# Patient Record
Sex: Female | Born: 1960 | Race: Black or African American | Hispanic: No | Smoking: Current every day smoker
Health system: Southern US, Community
[De-identification: ages and names within clinical notes are randomized; demographics above are authoritative.]

## PROBLEM LIST (undated history)

## (undated) DIAGNOSIS — F319 Bipolar disorder, unspecified: Secondary | ICD-10-CM

## (undated) DIAGNOSIS — F99 Mental disorder, not otherwise specified: Secondary | ICD-10-CM

## (undated) DIAGNOSIS — F329 Major depressive disorder, single episode, unspecified: Secondary | ICD-10-CM

## (undated) DIAGNOSIS — I10 Essential (primary) hypertension: Secondary | ICD-10-CM

## (undated) DIAGNOSIS — F32A Depression, unspecified: Secondary | ICD-10-CM

## (undated) DIAGNOSIS — C8590 Non-Hodgkin lymphoma, unspecified, unspecified site: Secondary | ICD-10-CM

## (undated) DIAGNOSIS — J439 Emphysema, unspecified: Secondary | ICD-10-CM

## (undated) DIAGNOSIS — F209 Schizophrenia, unspecified: Secondary | ICD-10-CM

## (undated) HISTORY — PX: NECK SURGERY: SHX720

## (undated) HISTORY — PX: TUBAL LIGATION: SHX77

## (undated) HISTORY — DX: Emphysema, unspecified: J43.9

## (undated) HISTORY — DX: Non-Hodgkin lymphoma, unspecified, unspecified site: C85.90

---

## 1979-05-14 DIAGNOSIS — D649 Anemia, unspecified: Secondary | ICD-10-CM

## 1979-05-14 HISTORY — DX: Anemia, unspecified: D64.9

## 2000-01-02 ENCOUNTER — Emergency Department (HOSPITAL_COMMUNITY): Admission: EM | Admit: 2000-01-02 | Discharge: 2000-01-02 | Payer: Self-pay | Admitting: Emergency Medicine

## 2001-04-30 ENCOUNTER — Emergency Department (HOSPITAL_COMMUNITY): Admission: EM | Admit: 2001-04-30 | Discharge: 2001-04-30 | Payer: Self-pay

## 2001-05-12 ENCOUNTER — Emergency Department (HOSPITAL_COMMUNITY): Admission: EM | Admit: 2001-05-12 | Discharge: 2001-05-12 | Payer: Self-pay | Admitting: Emergency Medicine

## 2002-10-05 ENCOUNTER — Other Ambulatory Visit: Admission: RE | Admit: 2002-10-05 | Discharge: 2002-10-05 | Payer: Self-pay | Admitting: Family Medicine

## 2004-07-28 ENCOUNTER — Emergency Department (HOSPITAL_COMMUNITY): Admission: EM | Admit: 2004-07-28 | Discharge: 2004-07-28 | Payer: Self-pay | Admitting: Emergency Medicine

## 2005-02-08 ENCOUNTER — Ambulatory Visit: Payer: Self-pay | Admitting: Family Medicine

## 2005-03-01 ENCOUNTER — Other Ambulatory Visit: Admission: RE | Admit: 2005-03-01 | Discharge: 2005-03-01 | Payer: Self-pay | Admitting: Family Medicine

## 2005-03-01 ENCOUNTER — Ambulatory Visit: Payer: Self-pay | Admitting: Family Medicine

## 2005-09-10 ENCOUNTER — Ambulatory Visit: Payer: Self-pay | Admitting: Family Medicine

## 2005-09-12 ENCOUNTER — Ambulatory Visit: Payer: Self-pay | Admitting: Family Medicine

## 2005-10-21 ENCOUNTER — Emergency Department (HOSPITAL_COMMUNITY): Admission: EM | Admit: 2005-10-21 | Discharge: 2005-10-21 | Payer: Self-pay | Admitting: Emergency Medicine

## 2006-04-25 ENCOUNTER — Emergency Department (HOSPITAL_COMMUNITY): Admission: EM | Admit: 2006-04-25 | Discharge: 2006-04-25 | Payer: Self-pay | Admitting: Emergency Medicine

## 2006-08-14 ENCOUNTER — Encounter (INDEPENDENT_AMBULATORY_CARE_PROVIDER_SITE_OTHER): Payer: Self-pay | Admitting: Family Medicine

## 2006-08-14 ENCOUNTER — Ambulatory Visit: Payer: Self-pay | Admitting: Family Medicine

## 2006-08-18 ENCOUNTER — Ambulatory Visit (HOSPITAL_COMMUNITY): Admission: RE | Admit: 2006-08-18 | Discharge: 2006-08-18 | Payer: Self-pay | Admitting: Family Medicine

## 2006-10-14 ENCOUNTER — Ambulatory Visit: Payer: Self-pay | Admitting: Internal Medicine

## 2006-11-19 ENCOUNTER — Ambulatory Visit: Payer: Self-pay | Admitting: Internal Medicine

## 2007-05-13 ENCOUNTER — Ambulatory Visit: Payer: Self-pay | Admitting: Family Medicine

## 2007-05-15 ENCOUNTER — Ambulatory Visit: Payer: Self-pay | Admitting: Family Medicine

## 2007-09-08 ENCOUNTER — Ambulatory Visit (HOSPITAL_COMMUNITY): Admission: RE | Admit: 2007-09-08 | Discharge: 2007-09-08 | Payer: Self-pay | Admitting: Family Medicine

## 2007-12-08 ENCOUNTER — Other Ambulatory Visit: Payer: Self-pay | Admitting: Emergency Medicine

## 2007-12-08 ENCOUNTER — Ambulatory Visit: Payer: Self-pay | Admitting: Psychiatry

## 2007-12-08 ENCOUNTER — Inpatient Hospital Stay (HOSPITAL_COMMUNITY): Admission: EM | Admit: 2007-12-08 | Discharge: 2007-12-12 | Payer: Self-pay | Admitting: Psychiatry

## 2008-07-04 ENCOUNTER — Emergency Department (HOSPITAL_COMMUNITY): Admission: EM | Admit: 2008-07-04 | Discharge: 2008-07-04 | Payer: Self-pay | Admitting: Emergency Medicine

## 2008-07-14 ENCOUNTER — Emergency Department (HOSPITAL_COMMUNITY): Admission: EM | Admit: 2008-07-14 | Discharge: 2008-07-14 | Payer: Self-pay | Admitting: Emergency Medicine

## 2008-10-10 ENCOUNTER — Emergency Department (HOSPITAL_COMMUNITY): Admission: EM | Admit: 2008-10-10 | Discharge: 2008-10-10 | Payer: Self-pay | Admitting: Family Medicine

## 2009-02-16 ENCOUNTER — Emergency Department (HOSPITAL_COMMUNITY): Admission: EM | Admit: 2009-02-16 | Discharge: 2009-02-16 | Payer: Self-pay | Admitting: Emergency Medicine

## 2009-02-27 ENCOUNTER — Ambulatory Visit: Payer: Self-pay | Admitting: Family Medicine

## 2009-03-10 ENCOUNTER — Emergency Department (HOSPITAL_COMMUNITY): Admission: EM | Admit: 2009-03-10 | Discharge: 2009-03-10 | Payer: Self-pay | Admitting: Family Medicine

## 2009-05-31 ENCOUNTER — Encounter (INDEPENDENT_AMBULATORY_CARE_PROVIDER_SITE_OTHER): Payer: Self-pay | Admitting: Adult Health

## 2009-05-31 ENCOUNTER — Other Ambulatory Visit: Admission: RE | Admit: 2009-05-31 | Discharge: 2009-05-31 | Payer: Self-pay | Admitting: Internal Medicine

## 2009-05-31 ENCOUNTER — Ambulatory Visit: Payer: Self-pay | Admitting: Internal Medicine

## 2009-05-31 LAB — CONVERTED CEMR LAB
AST: 18 units/L (ref 0–37)
Alkaline Phosphatase: 54 units/L (ref 39–117)
BUN: 16 mg/dL (ref 6–23)
Calcium: 9.4 mg/dL (ref 8.4–10.5)
Chlamydia, DNA Probe: NEGATIVE
Chloride: 98 meq/L (ref 96–112)
Creatinine, Ser: 0.71 mg/dL (ref 0.40–1.20)
Eosinophils Relative: 1 % (ref 0–5)
HCT: 36.9 % (ref 36.0–46.0)
Helicobacter Pylori Antibody-IgG: 0.7
Lymphocytes Relative: 33 % (ref 12–46)
Monocytes Relative: 6 % (ref 3–12)
Neutro Abs: 4.5 10*3/uL (ref 1.7–7.7)
Neutrophils Relative %: 61 % (ref 43–77)
RBC: 3.9 M/uL (ref 3.87–5.11)
Vit D, 25-Hydroxy: 11 ng/mL — ABNORMAL LOW (ref 30–89)

## 2009-06-09 ENCOUNTER — Ambulatory Visit (HOSPITAL_COMMUNITY): Admission: RE | Admit: 2009-06-09 | Discharge: 2009-06-09 | Payer: Self-pay | Admitting: Family Medicine

## 2009-06-28 ENCOUNTER — Ambulatory Visit (HOSPITAL_COMMUNITY): Admission: RE | Admit: 2009-06-28 | Discharge: 2009-06-28 | Payer: Self-pay | Admitting: Internal Medicine

## 2009-11-04 ENCOUNTER — Encounter: Payer: Self-pay | Admitting: Internal Medicine

## 2009-11-04 ENCOUNTER — Ambulatory Visit: Payer: Self-pay | Admitting: Internal Medicine

## 2009-11-04 ENCOUNTER — Inpatient Hospital Stay (HOSPITAL_COMMUNITY): Admission: EM | Admit: 2009-11-04 | Discharge: 2009-11-06 | Payer: Self-pay | Admitting: Emergency Medicine

## 2009-11-04 DIAGNOSIS — F329 Major depressive disorder, single episode, unspecified: Secondary | ICD-10-CM

## 2009-11-04 DIAGNOSIS — F3289 Other specified depressive episodes: Secondary | ICD-10-CM | POA: Insufficient documentation

## 2009-11-04 DIAGNOSIS — F1020 Alcohol dependence, uncomplicated: Secondary | ICD-10-CM | POA: Insufficient documentation

## 2009-11-04 DIAGNOSIS — R1084 Generalized abdominal pain: Secondary | ICD-10-CM

## 2009-11-04 DIAGNOSIS — I10 Essential (primary) hypertension: Secondary | ICD-10-CM

## 2010-06-11 ENCOUNTER — Ambulatory Visit (HOSPITAL_COMMUNITY): Admission: RE | Admit: 2010-06-11 | Payer: Self-pay | Source: Home / Self Care | Admitting: Internal Medicine

## 2010-06-12 NOTE — Assessment & Plan Note (Signed)
Summary: Hospital Admission  INTERNAL MEDICINE ADMISSION HISTORY AND PHYSICAL MWU:XLKG MW:[id number] pain:"my stomach is killing me."  R1Denton Meek [phone] R2Comer Locket [phone] Attending:Dr. Aundria Rud  VFI:EPPI is 50 year old female who presents with a chief complaint of abdominal pain. Patient presents stating "I am an alcoholic and I drink too much and drank too much yesterday on an empty stomache and now my stomache is killing me." patient states that she drank seven 40 ounce beers and half a fifth of liquor. She has been in AA and rehab in the past and was sober for 13 years but recently started drinking again. patient states that at 3 am this morning she woke with abdominal pain and vomited once with blood streaked vomit. has not taken anything for pain. denies blood in stool, fevers, chills, CP, SOB, dysuria, hematuria. patient states she was "stumbling last night" with drinking but denies injury from falls, hitting head or LOC.   ALLERGIES: PCN "swollen tongue and rash."  PAST MEDICAL HISTORY: ETOH abuse HTN Depression  MEDICATIONS: Lexapro 10 mg by mouth daily Antabuse 250 mg by mouth daily Trazadone 100 mg by mouth daily   SOCIAL HISTORY: 10th grade level education; single, Unemployed; lives with her son in the [address] app[address];  Smokes 7 cig/day since age 51 y/o Crack cocaine 1 q 4 months; last use on 11/03/2009. ETOH 4x 40oz beer daily.   FAMILY HISTORY Mother:HTN, CA(type?) at age 75 y/o Son: DM   ROS:per HPI VITALS: T: 98.0 P:70  BP: 131/80 R:22  O2SAT:98%  ON:RA PHYSICAL EXAM: General:  alert, well-developed, and cooperative to examination.   Head:  normocephalic and atraumatic.   Eyes:  vision grossly intact, pupils equal, pupils round, pupils reactive to light, no injection and anicteric.   Mouth:  pharynx pink and dry, no erythema, and no exudates.   Neck:  supple, full ROM, no thyromegaly, no JVD, and no carotid bruits.   Lungs:  normal respiratory  effort, no accessory muscle use, normal breath sounds, no crackles, and no wheezes. CV: RRR, no M, S3, S4, no lifts or rubs. Abdomen: Distended; BS hypoactive; shifting dullness present; tympanic with acute abdominal pain in RUL. LL and RLL quadrants; no rebound or guarding. MSK: FROM of all extremities proximately and distally bialterally; no joint erythema, effusion or increased warmth to touch bilaterally.  Neurologic:  alert & oriented X3, cranial nerves III-XII intact, strength normal in all extremities, sensation intact to light touch, and gait normal.   Skin:  turgor normal and no rashes.   Psych:  Oriented X3, memory intact for recent and remote, normally interactive, good eye contact, not anxious appearing, and not depressed appearing.  LABS: Bilirubin, Total                         0.8               0.3-1.2          mg/dL  Bilirubin, Direct                        0.4        h      0.0-0.3          mg/dL  Indirect Bilirubin                       0.4  0.3-0.9          mg/dL  Alkaline Phosphatase                     59                39-117           U/L  SGOT (AST)                               50         h      0-37             U/L  SGPT (ALT)                               27                0-35             U/L  Total  Protein                           8.4        h      6.0-8.3          g/dL  Albumin-Blood                            4.2               3.5-5.2          g/dL  Squamous Epithelial / LPF                FEW        a      RARE  WBC / HPF                                3-6               <3               WBC/hpf  RBC / HPF                                SEE NOTE.         <3               RBC/hpf    TOO NUMEROUS TO COUNT  Bacteria / HPF                           FEW        a      RARE  olor, Urine                             AMBER      a      YELLOW    BIOCHEMICALS MAY BE AFFECTED BY COLOR  Appearance                               TURBID  a      CLEAR   Specific Gravity                         >1.046     h      1.005-1.030  pH                                       5.0               5.0-8.0  Urine Glucose                            NEGATIVE          NEG              mg/dL  Bilirubin                                NEGATIVE          NEG  Ketones                                  NEGATIVE          NEG              mg/dL  Blood                                    LARGE      a      NEG  Protein                                  NEGATIVE          NEG              mg/dL  Urobilinogen                             0.2               0.0-1.0          mg/dL  Nitrite                                  NEGATIVE          NEG  Leukocytes                               TRACE      a      NEG  Pregnancy, Urine-POC                     NEGATIVE  Lipase                                   23                11-59  U/L  Sodium (NA)                              133        l      135-145          mEq/L  Potassium (K)                            4.9               3.5-5.1          mEq/L  Chloride                                 101               96-112           mEq/L  CO2                                      18         l      19-32            mEq/L  Glucose                                  96                70-99            mg/dL  BUN                                      13                6-23             mg/dL  Creatinine                               2.37       h      0.4-1.2          mg/dL  GFR, Est Non African American            22         l      >60              mL/min  GFR, Est African American                26         l      >60              mL/min    Oversized comment, see footnote  1  Calcium                                  9.4               8.4-10.5         mg/dL  WBC  9.2               4.0-10.5         K/uL  RBC                                      4.13              3.87-5.11        MIL/uL  Hemoglobin (HGB)                          14.0              12.0-15.0        g/dL  Hematocrit (HCT)                         40.2              36.0-46.0        %  MCV                                      97.4              78.0-100.0       fL  MCH -                                    34.0              26.0-34.0        pg  MCHC                                     34.9              30.0-36.0        g/dL  RDW                                      15.4              11.5-15.5        %  Platelet Count (PLT)                     237               150-400          K/uL  Neutrophils, %                           79         h      43-77            %  Lymphocytes, %                           18                12-46            %  Monocytes, %  3                 3-12             %  Eosinophils, %                           0                 0-5              %  Basophils, %                             0                 0-1              %  Neutrophils, Absolute                    7.3               1.7-7.7          K/uL  Lymphocytes, Absolute                    1.7               0.7-4.0          K/uL  Monocytes, Absolute                      0.3               0.1-1.0          K/uL  Eosinophils, Absolute                    0.0               0.0-0.7          K/uL  Basophils, Absolute                      0.0               0.0-0.1          K/uL  CT abdomen/Pelvis with CM: IMPRESSION:    1.  Moderate ascites, which is of uncertain etiology.   2.  Mild asymmetric wall thickening involving the right bladder   wall.  Differential diagnosis includes cystitis and transitional   cell carcinoma.  Correlation with urinalysis may be helpful, and   cystoscopy should be considered for further evaluation of this   finding.  ASSESSMENT AND PLAN: (1) Acute abdominal pain in a setting of ETOH abuse. Differential Dx include: CT of abdomen neg for cholecystitis, cholelithiasis or pancreatitis. a. Acute gastritis/PUD> Protonix  IV b. SBP> IR to perform paracenthesis; will send fluid for studies. c. Hepatitis> will check Hepatitis panel. c. PID>possible; will obtain GC/Chlamydia, HIV  (2)ETOH abuse. Counselling implemented; UDS and ETOH serum levels; PT/INR and PTT. CIWA protocol. Thiamine and Folate by mouth. (3)Hx of HTN. No antihypertensive medications for now. (4)Depression. Sees Dr. Viviann Spare at [facility]. Denies SI/HI or mania at present. Will restart Lexaprol and Trazadone. (5)VTE PROPH: SCD's

## 2010-06-12 NOTE — Discharge Summary (Signed)
Summary: Hospital Discharge Update    Hospital Discharge Update:  Date of Admission: 11/04/2009  Brief Summary:  1. Acute abdominal pain (SBP vs gastritis). US-guided parachenthesis with 0.7 L of fluid taken off. 2. HTN. 3.Depression. Restarted Lexapro. sees Dr. Viviann Spare at [facility] 4. PSA. UDS cocaine positive. Counselled. 5. ETOH abuse -ETOH level >100  Other follow-up issues:  UA -to reeval hematuria. Needs PSA counselling.  Problem list changes:  Added new problem of ESSENTIAL HYPERTENSION, BENIGN (ICD-401.1) - Signed Added new problem of DEPRESSION, RECURRENT (ICD-311) - Signed Added new problem of ALCOHOLISM (ICD-303.90) - Signed Added new problem of ABDOMINAL PAIN, GENERALIZED (ICD-789.07) - Signed  Medication list changes:  Added new medication of LEXAPRO 20 MG TABS (ESCITALOPRAM OXALATE) Take 1 tablet by mouth once a day - Signed Added new medication of TRAZODONE HCL 50 MG TABS (TRAZODONE HCL) Take 1 tab by mouth at bedtime - Signed Added new medication of HYDROCHLOROTHIAZIDE 25 MG TABS (HYDROCHLOROTHIAZIDE) Take 1 tablet by mouth every morning - Signed Added new medication of THIAMINE HCL 100 MG TABS (THIAMINE HCL) Take 1 tablet by mouth once a day - Signed Added new medication of FOLIC ACID 1 MG TABS (FOLIC ACID) Take 1 tablet by mouth once a day - Signed Changed medication from HYDROCHLOROTHIAZIDE 25 MG TABS (HYDROCHLOROTHIAZIDE) Take 1 tablet by mouth every morning to HYDROCHLOROTHIAZIDE 25 MG TABS (HYDROCHLOROTHIAZIDE) Take 1 tablet by mouth once a day - Signed Added new medication of NEXIUM 40 MG CPDR (ESOMEPRAZOLE MAGNESIUM) Take 1 tablet by mouth two times a day - Signed Rx of LEXAPRO 20 MG TABS (ESCITALOPRAM OXALATE) Take 1 tablet by mouth once a day;  #30 x 11;  Signed;  Entered by: Deatra Robinson MD;  Authorized by: Deatra Robinson MD;  Method used: Print then Give to Patient Rx of TRAZODONE HCL 50 MG TABS (TRAZODONE HCL) Take 1 tab by mouth at bedtime;   #30 x 11;  Signed;  Entered by: Deatra Robinson MD;  Authorized by: Deatra Robinson MD;  Method used: Print then Give to Patient Rx of HYDROCHLOROTHIAZIDE 25 MG TABS (HYDROCHLOROTHIAZIDE) Take 1 tablet by mouth every morning;  #30 x 11;  Signed;  Entered by: Deatra Robinson MD;  Authorized by: Deatra Robinson MD;  Method used: Print then Give to Patient Rx of THIAMINE HCL 100 MG TABS (THIAMINE HCL) Take 1 tablet by mouth once a day;  #30 x 11;  Signed;  Entered by: Deatra Robinson MD;  Authorized by: Deatra Robinson MD;  Method used: Print then Give to Patient Rx of FOLIC ACID 1 MG TABS (FOLIC ACID) Take 1 tablet by mouth once a day;  #30 x 11;  Signed;  Entered by: Deatra Robinson MD;  Authorized by: Deatra Robinson MD;  Method used: Print then Give to Patient Rx of HYDROCHLOROTHIAZIDE 25 MG TABS (HYDROCHLOROTHIAZIDE) Take 1 tablet by mouth once a day;  #30 x 2;  Signed;  Entered by: Deatra Robinson MD;  Authorized by: Deatra Robinson MD;  Method used: Print then Give to Patient Rx of NEXIUM 40 MG CPDR (ESOMEPRAZOLE MAGNESIUM) Take 1 tablet by mouth two times a day;  #60 x 0;  Signed;  Entered by: Deatra Robinson MD;  Authorized by: Deatra Robinson MD;  Method used: Print then Give to Patient  Allergy list changes:  Added new allergy or adverse reaction of PCN - Signed  The medication, problem, and allergy lists have been updated.  Please see the dictated discharge summary for details.  Discharge  medications:  LEXAPRO 20 MG TABS (ESCITALOPRAM OXALATE) Take 1 tablet by mouth once a day TRAZODONE HCL 50 MG TABS (TRAZODONE HCL) Take 1 tab by mouth at bedtime HYDROCHLOROTHIAZIDE 25 MG TABS (HYDROCHLOROTHIAZIDE) Take 1 tablet by mouth once a day THIAMINE HCL 100 MG TABS (THIAMINE HCL) Take 1 tablet by mouth once a day FOLIC ACID 1 MG TABS (FOLIC ACID) Take 1 tablet by mouth once a day NEXIUM 40 MG CPDR (ESOMEPRAZOLE MAGNESIUM) Take 1 tablet by mouth two times a day  Other patient instructions:    Please, follow up at [facility]  within 3 days.Phone#: [phone] Please, follow up with a Urologist. Phone number#:[phone] Please, avoid alcohol and street drugs. Call your doctor with any questions.  Note: Hospital Discharge Medications & Other Instructions handout was printed, one copy for patient and a second copy to be placed in hospital chart.   Appended Document: Hospital Discharge Update Patient per discharge instructions should [facility].  Patient has also been seen by a [facility] Dr.

## 2010-07-09 ENCOUNTER — Other Ambulatory Visit (HOSPITAL_COMMUNITY): Payer: Self-pay | Admitting: Family Medicine

## 2010-07-09 ENCOUNTER — Encounter (INDEPENDENT_AMBULATORY_CARE_PROVIDER_SITE_OTHER): Payer: Self-pay | Admitting: *Deleted

## 2010-07-09 DIAGNOSIS — Z1231 Encounter for screening mammogram for malignant neoplasm of breast: Secondary | ICD-10-CM

## 2010-07-09 LAB — CONVERTED CEMR LAB
ALT: 25 units/L (ref 0–35)
Albumin: 4.9 g/dL (ref 3.5–5.2)
Alkaline Phosphatase: 52 units/L (ref 39–117)
BUN: 9 mg/dL (ref 6–23)
CO2: 26 meq/L (ref 19–32)
Cholesterol: 200 mg/dL (ref 0–200)
Glucose, Bld: 96 mg/dL (ref 70–99)
HCT: 43 % (ref 36.0–46.0)
MCHC: 33.7 g/dL (ref 30.0–36.0)
Platelets: 232 10*3/uL (ref 150–400)
RDW: 13.9 % (ref 11.5–15.5)
Sodium: 138 meq/L (ref 135–145)
Total CHOL/HDL Ratio: 2
Total Protein: 8.2 g/dL (ref 6.0–8.3)
VLDL: 15 mg/dL (ref 0–40)

## 2010-07-13 ENCOUNTER — Encounter (INDEPENDENT_AMBULATORY_CARE_PROVIDER_SITE_OTHER): Payer: Self-pay | Admitting: *Deleted

## 2010-07-13 LAB — CONVERTED CEMR LAB
Free T4: 0.92 ng/dL (ref 0.80–1.80)
T3, Total: 72.8 ng/dL — ABNORMAL LOW (ref 80.0–204.0)

## 2010-07-16 ENCOUNTER — Ambulatory Visit (HOSPITAL_COMMUNITY): Payer: Medicaid Other

## 2010-07-20 ENCOUNTER — Ambulatory Visit (HOSPITAL_COMMUNITY)
Admission: RE | Admit: 2010-07-20 | Discharge: 2010-07-20 | Disposition: A | Discharge status: Home / Self Care | Payer: Medicaid Other | Source: Ambulatory Visit | Attending: Family Medicine | Admitting: Family Medicine

## 2010-07-20 DIAGNOSIS — Z1231 Encounter for screening mammogram for malignant neoplasm of breast: Secondary | ICD-10-CM | POA: Insufficient documentation

## 2010-07-29 LAB — CBC
HCT: 34.7 % — ABNORMAL LOW (ref 36.0–46.0)
HCT: 40.2 % (ref 36.0–46.0)
Hemoglobin: 12.1 g/dL (ref 12.0–15.0)
MCH: 34.3 pg — ABNORMAL HIGH (ref 26.0–34.0)
MCV: 97.4 fL (ref 78.0–100.0)
MCV: 98.1 fL (ref 78.0–100.0)
Platelets: 159 10*3/uL (ref 150–400)
Platelets: 163 10*3/uL (ref 150–400)
RBC: 3.52 MIL/uL — ABNORMAL LOW (ref 3.87–5.11)
RBC: 3.53 MIL/uL — ABNORMAL LOW (ref 3.87–5.11)
RDW: 15.4 % (ref 11.5–15.5)
WBC: 5.2 10*3/uL (ref 4.0–10.5)
WBC: 6.2 10*3/uL (ref 4.0–10.5)

## 2010-07-29 LAB — BODY FLUID CULTURE

## 2010-07-29 LAB — DIFFERENTIAL
Basophils Absolute: 0 10*3/uL (ref 0.0–0.1)
Basophils Relative: 0 % (ref 0–1)
Lymphocytes Relative: 18 % (ref 12–46)
Neutro Abs: 7.3 10*3/uL (ref 1.7–7.7)
Neutrophils Relative %: 79 % — ABNORMAL HIGH (ref 43–77)

## 2010-07-29 LAB — AMYLASE, BODY FLUID: Amylase, Fluid: 137 U/L

## 2010-07-29 LAB — BODY FLUID CELL COUNT WITH DIFFERENTIAL
Monocyte-Macrophage-Serous Fluid: 39 % — ABNORMAL LOW (ref 50–90)
Total Nucleated Cell Count, Fluid: 900 cu mm (ref 0–1000)

## 2010-07-29 LAB — BASIC METABOLIC PANEL
BUN: 13 mg/dL (ref 6–23)
CO2: 18 mEq/L — ABNORMAL LOW (ref 19–32)
CO2: 26 mEq/L (ref 19–32)
Calcium: 8.7 mg/dL (ref 8.4–10.5)
Chloride: 105 mEq/L (ref 96–112)
Creatinine, Ser: 0.82 mg/dL (ref 0.4–1.2)
GFR calc Af Amer: >60 mL/min (ref >60)
GFR calc Af Amer: >60 mL/min (ref >60)
GFR calc non Af Amer: 22 mL/min — ABNORMAL LOW (ref >60)
GFR calc non Af Amer: >60 mL/min (ref >60)
GFR calc non Af Amer: >60 mL/min (ref >60)
Potassium: 4.2 mEq/L (ref 3.5–5.1)
Potassium: 4.9 mEq/L (ref 3.5–5.1)
Sodium: 134 mEq/L — ABNORMAL LOW (ref 135–145)

## 2010-07-29 LAB — URINALYSIS, ROUTINE W REFLEX MICROSCOPIC
Bilirubin Urine: NEGATIVE
Glucose, UA: NEGATIVE mg/dL
Ketones, ur: NEGATIVE mg/dL
Leukocytes, UA: NEGATIVE
Nitrite: NEGATIVE
Nitrite: NEGATIVE
Protein, ur: NEGATIVE mg/dL
Specific Gravity, Urine: 1.042 — ABNORMAL HIGH (ref 1.005–1.030)
Specific Gravity, Urine: >1.046 — ABNORMAL HIGH (ref 1.005–1.030)
pH: 5 (ref 5.0–8.0)

## 2010-07-29 LAB — PH, BODY FLUID

## 2010-07-29 LAB — HEPATIC FUNCTION PANEL
Albumin: 4.2 g/dL (ref 3.5–5.2)
Bilirubin, Direct: 0.4 mg/dL — ABNORMAL HIGH (ref 0.0–0.3)
Total Bilirubin: 0.8 mg/dL (ref 0.3–1.2)

## 2010-07-29 LAB — LIPASE, BLOOD: Lipase: 23 U/L (ref 11–59)

## 2010-07-29 LAB — SODIUM, URINE, RANDOM: Sodium, Ur: 104 mEq/L

## 2010-07-29 LAB — PROTEIN, BODY FLUID

## 2010-07-29 LAB — RAPID URINE DRUG SCREEN, HOSP PERFORMED
Opiates: NOT DETECTED
Tetrahydrocannabinol: NOT DETECTED

## 2010-07-29 LAB — URINE MICROSCOPIC-ADD ON

## 2010-07-29 LAB — LACTATE DEHYDROGENASE: LDH: 181 U/L (ref 94–250)

## 2010-07-29 LAB — URINE CULTURE

## 2010-07-29 LAB — PROTIME-INR
INR: 1.03 (ref 0.00–1.49)
Prothrombin Time: 13.4 seconds (ref 11.6–15.2)

## 2010-07-29 LAB — GRAM STAIN

## 2010-07-29 LAB — HEPATITIS B SURFACE ANTIGEN: Hepatitis B Surface Ag: NEGATIVE

## 2010-07-29 LAB — GC/CHLAMYDIA PROBE AMP, URINE: Chlamydia, Swab/Urine, PCR: NEGATIVE

## 2010-08-16 LAB — CBC
HCT: 35.8 % — ABNORMAL LOW (ref 36.0–46.0)
Hemoglobin: 12.3 g/dL (ref 12.0–15.0)
RBC: 3.7 MIL/uL — ABNORMAL LOW (ref 3.87–5.11)
RDW: 15.6 % — ABNORMAL HIGH (ref 11.5–15.5)
WBC: 5.9 10*3/uL (ref 4.0–10.5)

## 2010-08-16 LAB — DIFFERENTIAL
Basophils Absolute: 0 10*3/uL (ref 0.0–0.1)
Eosinophils Relative: 1 % (ref 0–5)
Lymphocytes Relative: 35 % (ref 12–46)
Lymphs Abs: 2 10*3/uL (ref 0.7–4.0)
Monocytes Absolute: 0.4 10*3/uL (ref 0.1–1.0)
Neutro Abs: 3.3 10*3/uL (ref 1.7–7.7)

## 2010-08-16 LAB — BASIC METABOLIC PANEL
CO2: 25 mEq/L (ref 19–32)
Chloride: 108 mEq/L (ref 96–112)
GFR calc Af Amer: >60 mL/min (ref >60)
Potassium: 4.3 mEq/L (ref 3.5–5.1)
Sodium: 138 mEq/L (ref 135–145)

## 2010-08-28 LAB — RAPID URINE DRUG SCREEN, HOSP PERFORMED
Amphetamines: NOT DETECTED
Benzodiazepines: NOT DETECTED

## 2010-08-28 LAB — DIFFERENTIAL
Eosinophils Absolute: 0 10*3/uL (ref 0.0–0.7)
Eosinophils Relative: 1 % (ref 0–5)
Lymphocytes Relative: 33 % (ref 12–46)
Lymphs Abs: 2.1 10*3/uL (ref 0.7–4.0)
Monocytes Absolute: 0.3 10*3/uL (ref 0.1–1.0)

## 2010-08-28 LAB — BASIC METABOLIC PANEL
BUN: 9 mg/dL (ref 6–23)
Chloride: 107 mEq/L (ref 96–112)
GFR calc non Af Amer: >60 mL/min (ref >60)
Glucose, Bld: 121 mg/dL — ABNORMAL HIGH (ref 70–99)
Potassium: 3.6 mEq/L (ref 3.5–5.1)
Sodium: 140 mEq/L (ref 135–145)

## 2010-08-28 LAB — CBC
HCT: 37.2 % (ref 36.0–46.0)
Hemoglobin: 13 g/dL (ref 12.0–15.0)
MCV: 97.1 fL (ref 78.0–100.0)
RDW: 13.1 % (ref 11.5–15.5)
WBC: 6.3 10*3/uL (ref 4.0–10.5)

## 2010-08-28 LAB — TRICYCLICS SCREEN, URINE: TCA Scrn: NOT DETECTED
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
## 2010-09-28 NOTE — Discharge Summary (Signed)
NAMEMARGARITA, [name] NO.:  [id number]   MEDICAL RECORD NO.:  [id number]          PATIENT TYPE:  IPS   LOCATION:  0305                          FACILITY:  [facility]   PHYSICIAN:  Geoffery Lyons, M.D.      DATE OF BIRTH:  06-Aug-1960   DATE OF ADMISSION:  12/08/2007  DATE OF DISCHARGE:  12/12/2007                               DISCHARGE SUMMARY   CHIEF COMPLAINT/HISTORY OF PRESENT ILLNESS:  This was the first  admission to [facility] for this 50 year old African  American female voluntarily admitted.  She endorsed that she took a  bunch of pills,   She has used both cocaine and alcohol prior to the  overdose.  She had been smoking cocaine about once every 2 weeks and  using marijuana daily over the past 2 or 3 years.  Alcohol consumption  has recently increased.  For the last 2  months has been using at least  six packs daily.  The day prior to admission, she drank at least five 24-  ounce beers and had the 40-ounce beers immediately before overdosing.  Endorsed she wanted to go to sleep and never wake up again.  Started  drinking again 3 years after this admission after a 13-year period of  abstinence.  Claimed that 3 years prior to this admission she was  involved with the wrong kind of people.  She lost her job 2 months prior  to this admission.  At that time her drinking inflated.  Reports 2  months of increased depression, more regular use of cocaine, decreased  sleep.   PAST PSYCHIATRIC HISTORY:  Had been at [facility].  [facility]  No current treatment.   ALCOHOL/DRUG HISTORY:  As already stated, persistent use of alcohol,  cocaine and marijuana.   MEDICAL HISTORY:  High blood pressure.   PHYSICAL EXAM:  Failed to show any acute findings.   LABORATORY WORKUP:  Sodium 140, potassium 4.5, BUN 8, creatinine 0.87,  glucose 93, SGOT 39, SGPT 39.  Total bilirubin 1.3.  Alcohol level 240  upon admission.  Drug  screen positive for cocaine and marijuana.   MENTAL STATUS EXAM:  Reveals a fully alert cooperative female, anxious,  engaged in conversation.  Adequate eye contact, somewhat disheveled.  Speech was normal in pace, tone and production.  Thought processes  logical, coherent and relevant.  No evidence of delusions.  No active  suicidal or homicidal ideas, no hallucinations.  Cognition well-  preserved.   ADMISSION DIAGNOSES:  Axis  I: Depressive disorder, not otherwise  specified.  Alcohol, marijuana, cocaine abuse.  Rule out dependence.  Axis II: No diagnosis.  Axis III:  High blood pressure.  Axis IV: Moderate.  Axis V:  Upon admission 35-48, highest GAF in the last year 60.   COURSE IN THE HOSPITAL:  She was admitted, started in individual and  group psychotherapy.  She was detoxified with Librium.  She was started  on Lexapro and eventually Antabuse.  As already stated, increased use of  alcohol, increased depression,  persistent use of crack and marijuana.  Lost her job secondary to being seen intoxicated.  July 30 continued to  be detoxed.  Pretty loud affect, tearful.  Endorsed craving, worried  about when she gets out of the hospital, how to stay sober.  We  discussed the possibility of going back on Antabuse as she endorsed she  had used it successfully.  We went ahead and discharged on Antabuse.  She had been dealing with a lot of shame and guilt as when she drinks,  she becomes belligerent and violent.  Very worried as she blacks out.  Afraid that she may get to a point where she might hurt someone without  realizing it.  By August 1 she was in full contact with reality, fully  detoxed.  The detox went uneventfully.  She was feeling better.  Denied  any active suicidal or homicidal ideas, no evidence of hallucinations or  delusions or acute withdrawal.  We went ahead and discharged to  outpatient followup.   DISCHARGE DIAGNOSES:  Axis I: Alcohol dependence.  Cocaine,  marijuana  abuse.  Depressive disorder, not otherwise specified.  Axis II:  No diagnosis.  Axis III:  High blood pressure.  Axis IV: Moderate.  Axis V:  Upon discharge 55-60.   Discharged on:  1. Lexapro 10 mg daily.  2. Antabuse 250 mg per day.  3. Trazodone 100 at bedtime for sleep.   FOLLOWUP:  [facility].      Geoffery Lyons, M.D.  Electronically Signed     IL/MEDQ  D:  01/13/2008  T:  01/13/2008  Job:  [id number]

## 2011-02-03 ENCOUNTER — Emergency Department (HOSPITAL_COMMUNITY)
Admission: EM | Admit: 2011-02-03 | Discharge: 2011-02-04 | Disposition: A | Discharge status: Home / Self Care | Payer: Self-pay | Attending: Emergency Medicine | Admitting: Emergency Medicine

## 2011-02-03 DIAGNOSIS — I10 Essential (primary) hypertension: Secondary | ICD-10-CM | POA: Insufficient documentation

## 2011-02-03 DIAGNOSIS — W1809XA Striking against other object with subsequent fall, initial encounter: Secondary | ICD-10-CM | POA: Insufficient documentation

## 2011-02-03 DIAGNOSIS — F1010 Alcohol abuse, uncomplicated: Secondary | ICD-10-CM | POA: Insufficient documentation

## 2011-02-03 DIAGNOSIS — R51 Headache: Secondary | ICD-10-CM | POA: Insufficient documentation

## 2011-02-03 DIAGNOSIS — S01409A Unspecified open wound of unspecified cheek and temporomandibular area, initial encounter: Secondary | ICD-10-CM | POA: Insufficient documentation

## 2011-02-04 ENCOUNTER — Encounter (HOSPITAL_COMMUNITY): Payer: Self-pay

## 2011-02-04 ENCOUNTER — Emergency Department (HOSPITAL_COMMUNITY): Payer: Self-pay

## 2011-02-04 LAB — POCT I-STAT, CHEM 8
BUN: 9 mg/dL (ref 6–23)
Chloride: 110 mEq/L (ref 96–112)
Potassium: 3.7 mEq/L (ref 3.5–5.1)
Sodium: 144 mEq/L (ref 135–145)
TCO2: 20 mmol/L (ref 0–100)

## 2011-02-04 LAB — CBC
MCH: 33.7 pg (ref 26.0–34.0)
Platelets: 183 10*3/uL (ref 150–400)
RBC: 3.62 MIL/uL — ABNORMAL LOW (ref 3.87–5.11)
WBC: 6.8 10*3/uL (ref 4.0–10.5)

## 2011-02-04 LAB — DIFFERENTIAL
Basophils Absolute: 0 10*3/uL (ref 0.0–0.1)
Basophils Relative: 0 % (ref 0–1)
Eosinophils Absolute: 0.1 10*3/uL (ref 0.0–0.7)
Neutro Abs: 2.9 10*3/uL (ref 1.7–7.7)
Neutrophils Relative %: 43 % (ref 43–77)

## 2011-02-04 LAB — ETHANOL: Alcohol, Ethyl (B): 417 mg/dL (ref 0–11)

## 2011-02-08 LAB — COMPREHENSIVE METABOLIC PANEL
ALT: 39 — ABNORMAL HIGH
AST: 39 — ABNORMAL HIGH
CO2: 22
Calcium: 9.5
GFR calc Af Amer: >60
GFR calc non Af Amer: >60
Sodium: 140
Total Protein: 7.4

## 2011-02-08 LAB — RPR: RPR Ser Ql: NONREACTIVE

## 2011-02-08 LAB — DIFFERENTIAL
Eosinophils Absolute: 0
Eosinophils Relative: 0
Lymphs Abs: 1.8
Monocytes Relative: 5

## 2011-02-08 LAB — RAPID URINE DRUG SCREEN, HOSP PERFORMED
Amphetamines: NOT DETECTED
Benzodiazepines: NOT DETECTED
Tetrahydrocannabinol: POSITIVE — AB

## 2011-02-08 LAB — ACETAMINOPHEN LEVEL: Acetaminophen (Tylenol), Serum: <10 — ABNORMAL LOW

## 2011-02-08 LAB — TSH: TSH: 0.793

## 2011-02-08 LAB — CBC
MCHC: 33.8
RBC: 3.93

## 2011-02-08 LAB — ETHANOL: Alcohol, Ethyl (B): 240 — ABNORMAL HIGH

## 2011-05-21 ENCOUNTER — Encounter (HOSPITAL_COMMUNITY): Payer: Self-pay

## 2011-05-21 ENCOUNTER — Emergency Department (HOSPITAL_COMMUNITY): Payer: Self-pay

## 2011-05-21 ENCOUNTER — Emergency Department (HOSPITAL_COMMUNITY)
Admission: EM | Admit: 2011-05-21 | Discharge: 2011-05-21 | Disposition: A | Discharge status: Home / Self Care | Payer: Self-pay | Attending: Emergency Medicine | Admitting: Emergency Medicine

## 2011-05-21 DIAGNOSIS — M542 Cervicalgia: Secondary | ICD-10-CM | POA: Insufficient documentation

## 2011-05-21 DIAGNOSIS — M5380 Other specified dorsopathies, site unspecified: Secondary | ICD-10-CM | POA: Insufficient documentation

## 2011-05-21 DIAGNOSIS — I10 Essential (primary) hypertension: Secondary | ICD-10-CM | POA: Insufficient documentation

## 2011-05-21 DIAGNOSIS — M1990 Unspecified osteoarthritis, unspecified site: Secondary | ICD-10-CM

## 2011-05-21 DIAGNOSIS — M47812 Spondylosis without myelopathy or radiculopathy, cervical region: Secondary | ICD-10-CM | POA: Insufficient documentation

## 2011-05-21 DIAGNOSIS — R51 Headache: Secondary | ICD-10-CM | POA: Insufficient documentation

## 2011-05-21 DIAGNOSIS — R209 Unspecified disturbances of skin sensation: Secondary | ICD-10-CM | POA: Insufficient documentation

## 2011-05-21 DIAGNOSIS — M545 Low back pain, unspecified: Secondary | ICD-10-CM | POA: Insufficient documentation

## 2011-05-21 DIAGNOSIS — M47817 Spondylosis without myelopathy or radiculopathy, lumbosacral region: Secondary | ICD-10-CM | POA: Insufficient documentation

## 2011-05-21 HISTORY — DX: Essential (primary) hypertension: I10

## 2011-05-21 MED ORDER — HYDROCODONE-ACETAMINOPHEN 5-325 MG PO TABS: 1.0000 | ORAL_TABLET | ORAL | Status: AC | PRN

## 2011-05-21 MED ORDER — IBUPROFEN 800 MG PO TABS: 800.0000 mg | ORAL_TABLET | Freq: Three times a day (TID) | ORAL | Status: AC

## 2011-05-21 MED ORDER — HYDROCHLOROTHIAZIDE 25 MG PO TABS: 25.0000 mg | ORAL_TABLET | Freq: Every day | ORAL | Status: DC

## 2011-05-21 NOTE — ED Notes (Signed)
Three days ago. Developed pain  In her lower rt. Side of her head that radiates into rt. Posterior neck area,  She describes it as "Numb feeling"  She also is having rt. Hip pain, pt. Denies any injuries, pt. Has no neuro deficits noted.

## 2011-05-21 NOTE — ED Notes (Signed)
Returned from xray

## 2011-05-21 NOTE — ED Provider Notes (Signed)
History     CSN: [id number]  Arrival date & time 05/21/11  1033   First MD Initiated Contact with Patient 05/21/11 1229      Chief Complaint  Patient presents with  . Headache    (Consider location/radiation/quality/duration/timing/severity/associated sxs/prior treatment) Patient is a 51 y.o. female presenting with headaches. The history is provided by the patient.  Headache   Her pain is actually in the left side of the neck and radiates up to the left occiput. She describes a numb feeling. Nothing makes it worse and nothing makes it better. She is treated herself with BC powder which does give her some slight relief. She can recall no trauma. She denies any visual changes, denies any weakness or numbness or tingling. She is also complaining of pain in the lower lumbar area radiating across the lower back. This is a dull pain and is also slightly improved with BC powder. Nothing makes the lower back pain worse. Again, she denies any recent trauma. Pain does not actually go into her hips. There's no radiation of pain into the legs.  She has a history of hypertension and states she ran out of her blood pressure medicine one month ago. She had been going to [facility] but stopped going there when her Medicaid expired. She does not remember what her blood pressure medications were.  Past Medical History  Diagnosis Date  . Hypertension     History reviewed. No pertinent past surgical history.  No family history on file.  History  Substance Use Topics  . Smoking status: Current Everyday Smoker  . Smokeless tobacco: Not on file  . Alcohol Use: Yes    OB History    Grav Para Term Preterm Abortions TAB SAB Ect Mult Living                  Review of Systems  Neurological: Positive for headaches.  All other systems reviewed and are negative.    Allergies  Penicillins  Home Medications   Current Outpatient Rx  Name Route Sig Dispense Refill  . BC HEADACHE POWDER PO Oral  Take 1 packet by mouth 6 (six) times daily. For pain     . IBUPROFEN 200 MG PO TABS Oral Take 200 mg by mouth every 6 (six) hours as needed. For pain       BP 136/104  Pulse 80  Temp(Src) 97.9 F (36.6 C) (Oral)  Resp 16  Ht 5\' 4"  (1.626 m)  Wt 140 lb (63.504 kg)  BMI 24.03 kg/m2  SpO2 98%  LMP 05/14/2011  Physical Exam  Nursing note and vitals reviewed.  51 year old female who is resting comfortably and in no acute distress. Vital signs show mild hypertension blood pressure 136/104. Oxygen saturation is 98% which is normal. Head is normocephalic and atraumatic. PERRLA, EOMI. Fundi show no hemorrhages or exudates. Oropharynx is clear. Neck is nontender without masses or adenopathy. Scalp shows no rash that would be indicative of herpes zoster. Back has mild tenderness in the mid and lower lumbar area with moderate bilateral paralumbar spasm. Straight leg raise is negative. Lungs are clear without rales, wheezes, rhonchi. Heart has regular rate and rhythm without murmur. Abdomen is soft, flat, nontender without masses or hepatosplenomegaly. Extremities have no cyanosis or edema, full range of motion present. Psychiatric shows no abnormalities of mood or affect.  ED Course  Procedures (including critical care time)  Labs Reviewed - No data to display No results found. Results for orders placed during  the hospital encounter of 02/03/11  DIFFERENTIAL      Component Value Range   Neutrophils Relative 43  43 - 77 (%)   Neutro Abs 2.9  1.7 - 7.7 (K/uL)   Lymphocytes Relative 50 (*) 12 - 46 (%)   Lymphs Abs 3.4  0.7 - 4.0 (K/uL)   Monocytes Relative 6  3 - 12 (%)   Monocytes Absolute 0.4  0.1 - 1.0 (K/uL)   Eosinophils Relative 1  0 - 5 (%)   Eosinophils Absolute 0.1  0.0 - 0.7 (K/uL)   Basophils Relative 0  0 - 1 (%)   Basophils Absolute 0.0  0.0 - 0.1 (K/uL)  CBC      Component Value Range   WBC 6.8  4.0 - 10.5 (K/uL)   RBC 3.62 (*) 3.87 - 5.11 (MIL/uL)   Hemoglobin 12.2  12.0 -  15.0 (g/dL)   HCT 16.1 (*) 09.6 - 46.0 (%)   MCV 97.0  78.0 - 100.0 (fL)   MCH 33.7  26.0 - 34.0 (pg)   MCHC 34.8  30.0 - 36.0 (g/dL)   RDW 04.5  40.9 - 81.1 (%)   Platelets 183  150 - 400 (K/uL)  POCT I-STAT, CHEM 8      Component Value Range   Sodium 144  135 - 145 (mEq/L)   Potassium 3.7  3.5 - 5.1 (mEq/L)   Chloride 110  96 - 112 (mEq/L)   BUN 9  6 - 23 (mg/dL)   Creatinine, Ser 9.14  0.50 - 1.10 (mg/dL)   Glucose, Bld 82  70 - 99 (mg/dL)   Calcium, Ion 7.82  9.56 - 1.32 (mmol/L)   TCO2 20  0 - 100 (mmol/L)   Hemoglobin 13.6  12.0 - 15.0 (g/dL)   HCT 21.3  08.6 - 57.8 (%)  ETHANOL      Component Value Range   Alcohol, Ethyl (B) 417 (*) 0 - 11 (mg/dL)   Dg Cervical Spine Complete  05/21/2011  *RADIOLOGY REPORT*  Clinical Data: Left neck pain  CERVICAL SPINE - COMPLETE 4+ VIEW  Comparison: None.  Findings: Cervical spine visualize to C7-C1 on the lateral view.  No evidence of fracture or dislocation. Vertebral body heights are maintained.  Dens appears intact.  Lateral masses of C1 are symmetric.  No prevertebral soft tissue swelling.  Mild multilevel degenerative changes, most prominent at C5-6.  Bilateral neural foramina are patent.  Visualized lung apices are clear.  IMPRESSION: No fracture or dislocation is seen.  Mild multilevel degenerative changes.  Original Report Authenticated By: Charline Bills, M.D.   Dg Lumbar Spine Complete  05/21/2011  *RADIOLOGY REPORT*  Clinical Data: Low back pain.  LUMBAR SPINE - COMPLETE 4+ VIEW  Comparison: None.  Findings: Degenerative facet disease in the lower lumbar spine. Disc spaces are maintained.  Normal alignment.  No fracture.  IMPRESSION: Degenerative facet disease.  No acute findings.  Original Report Authenticated By: Cyndie Chime, M.D.      No diagnosis found.  X-rays confirm degenerative changes in cervical and lumbar spine which I feel are the cause of her pain. She is advised to stop taking BC powder and use over-the-counter  ibuprofen or naproxen. She's also given a prescription for Norco to use as needed for more severe pain.  MDM  Neck and low back pain which are most likely related to degenerative joint disease. Neck pain could possibly be early herpes zoster although no rashes present currently. X-rays are being obtained. Old  records were obtained showing her prior blood pressure medicine was hydrochlorothiazide 25 mg. She will be given a prescription for this.        Dione Booze, MD 05/21/11 1400

## 2011-10-05 ENCOUNTER — Emergency Department (HOSPITAL_COMMUNITY): Payer: Self-pay

## 2011-10-05 ENCOUNTER — Emergency Department (HOSPITAL_COMMUNITY)
Admission: EM | Admit: 2011-10-05 | Discharge: 2011-10-05 | Disposition: A | Discharge status: Home / Self Care | Payer: Self-pay | Attending: Emergency Medicine | Admitting: Emergency Medicine

## 2011-10-05 ENCOUNTER — Encounter (HOSPITAL_COMMUNITY): Payer: Self-pay | Admitting: *Deleted

## 2011-10-05 DIAGNOSIS — M542 Cervicalgia: Secondary | ICD-10-CM | POA: Insufficient documentation

## 2011-10-05 DIAGNOSIS — R51 Headache: Secondary | ICD-10-CM | POA: Insufficient documentation

## 2011-10-05 DIAGNOSIS — I10 Essential (primary) hypertension: Secondary | ICD-10-CM | POA: Insufficient documentation

## 2011-10-05 DIAGNOSIS — F17200 Nicotine dependence, unspecified, uncomplicated: Secondary | ICD-10-CM | POA: Insufficient documentation

## 2011-10-05 MED ORDER — HYDROCHLOROTHIAZIDE 25 MG PO TABS: 25.0000 mg | ORAL_TABLET | Freq: Every day | ORAL | Status: AC

## 2011-10-05 MED ORDER — CYCLOBENZAPRINE HCL 10 MG PO TABS: 10.0000 mg | ORAL_TABLET | Freq: Two times a day (BID) | ORAL | Status: AC | PRN

## 2011-10-05 MED ORDER — IBUPROFEN 800 MG PO TABS: 800.0000 mg | ORAL_TABLET | Freq: Three times a day (TID) | ORAL | Status: AC

## 2011-10-05 NOTE — ED Notes (Signed)
Pt reports pain to back of neck to left shoulder and feels like she can not turn her head to left and reports when she tries to turn her head she hears a popping noise.  Pt reports pain has been ongoing for a month. Pt denies injury. Pt also reports head. Pt denies numbness, tingling or pain into hands or arms. Pt reports taking tylenol 3 for pain and did not improve.  Pt reports last dose was 830AM.

## 2011-10-05 NOTE — ED Provider Notes (Signed)
History     CSN: [id number]  Arrival date & time 10/05/11  1222   First MD Initiated Contact with Patient 10/05/11 1243      Chief Complaint  Patient presents with  . Neck Pain    (Consider location/radiation/quality/duration/timing/severity/associated sxs/prior treatment) Patient is a 51 y.o. female presenting with neck injury. The history is provided by the patient. No language interpreter was used.  Neck Injury This is a new problem. The current episode started more than 1 month ago. The problem occurs constantly. The problem has been gradually worsening. Associated symptoms include headaches. The symptoms are aggravated by twisting. She has tried nothing for the symptoms. The treatment provided moderate relief.  Pt complains of pain in her neck.  Pt reports she has a pop in her head when she turns her neck.  Pt complains of pain with lifting shoulder  Past Medical History  Diagnosis Date  . Hypertension     Past Surgical History  Procedure Date  . Cystectomy   . Tubal ligation     No family history on file.  History  Substance Use Topics  . Smoking status: Current Everyday Smoker  . Smokeless tobacco: Not on file  . Alcohol Use: Yes    OB History    Grav Para Term Preterm Abortions TAB SAB Ect Mult Living                  Review of Systems  Neurological: Positive for headaches.  All other systems reviewed and are negative.    Allergies  Penicillins  Home Medications   Current Outpatient Rx  Name Route Sig Dispense Refill  . ASPIRIN-SALICYLAMIDE-CAFFEINE 650-195-33.3 MG PO PACK Oral Take 1 packet by mouth every 6 (six) hours.    . IBUPROFEN 200 MG PO TABS Oral Take 200 mg by mouth every 6 (six) hours as needed. For pain       BP 178/103  Pulse 68  Temp(Src) 97.9 F (36.6 C) (Oral)  Resp 20  SpO2 100%  LMP 08/26/2011  Physical Exam  Nursing note and vitals reviewed. Constitutional: She is oriented to person, place, and time. She appears  well-developed and well-nourished.  HENT:  Head: Normocephalic and atraumatic.  Right Ear: External ear normal.  Left Ear: External ear normal.  Nose: Nose normal.  Mouth/Throat: Oropharynx is clear and moist.  Eyes: Pupils are equal, round, and reactive to light.  Neck: Normal range of motion. Neck supple.  Cardiovascular: Normal rate, regular rhythm and normal heart sounds.   Pulmonary/Chest: Effort normal and breath sounds normal.  Abdominal: Soft.  Musculoskeletal: Normal range of motion.  Neurological: She is alert and oriented to person, place, and time. She has normal reflexes.  Skin: Skin is warm.  Psychiatric: She has a normal mood and affect.    ED Course  Procedures (including critical care time)  Labs Reviewed - No data to display No results found.   No diagnosis found.    MDM   Results for orders placed during the hospital encounter of 02/03/11  DIFFERENTIAL      Component Value Range   Neutrophils Relative 43  43 - 77 (%)   Neutro Abs 2.9  1.7 - 7.7 (K/uL)   Lymphocytes Relative 50 (*) 12 - 46 (%)   Lymphs Abs 3.4  0.7 - 4.0 (K/uL)   Monocytes Relative 6  3 - 12 (%)   Monocytes Absolute 0.4  0.1 - 1.0 (K/uL)   Eosinophils Relative 1  0 -  5 (%)   Eosinophils Absolute 0.1  0.0 - 0.7 (K/uL)   Basophils Relative 0  0 - 1 (%)   Basophils Absolute 0.0  0.0 - 0.1 (K/uL)  CBC      Component Value Range   WBC 6.8  4.0 - 10.5 (K/uL)   RBC 3.62 (*) 3.87 - 5.11 (MIL/uL)   Hemoglobin 12.2  12.0 - 15.0 (g/dL)   HCT 16.1 (*) 09.6 - 46.0 (%)   MCV 97.0  78.0 - 100.0 (fL)   MCH 33.7  26.0 - 34.0 (pg)   MCHC 34.8  30.0 - 36.0 (g/dL)   RDW 04.5  40.9 - 81.1 (%)   Platelets 183  150 - 400 (K/uL)  POCT I-STAT, CHEM 8      Component Value Range   Sodium 144  135 - 145 (mEq/L)   Potassium 3.7  3.5 - 5.1 (mEq/L)   Chloride 110  96 - 112 (mEq/L)   BUN 9  6 - 23 (mg/dL)   Creatinine, Ser 9.14  0.50 - 1.10 (mg/dL)   Glucose, Bld 82  70 - 99 (mg/dL)   Calcium, Ion  7.82  1.12 - 1.32 (mmol/L)   TCO2 20  0 - 100 (mmol/L)   Hemoglobin 13.6  12.0 - 15.0 (g/dL)   HCT 95.6  21.3 - 08.6 (%)  ETHANOL      Component Value Range   Alcohol, Ethyl (B) 417 (*) 0 - 11 (mg/dL)   Dg Cervical Spine Complete  10/05/2011  *RADIOLOGY REPORT*  Clinical Data: Left-sided neck pain for 1 month.  No known trauma.  CERVICAL SPINE - COMPLETE 4+ VIEW  Comparison: 05/21/2011  Findings: There are degenerative changes in the mid cervical spine, most notable at C3-4, C5-6.  No evidence for acute fracture or subluxation.  Prevertebral soft tissues have a normal appearance.  IMPRESSION:  1.  Degenerative changes are present. 2. No evidence for acute cervical spine abnormality.  Original Report Authenticated By: Patterson Hammersmith, M.D.      Pt advised to follow up with Dr. Lajoyce Corners for evaluation.     Lonia Skinner [place], [place] 10/05/11 1418

## 2011-10-05 NOTE — Discharge Instructions (Signed)
Arthritis, Nonspecific Arthritis is inflammation of a joint. This usually means pain, redness, warmth or swelling are present. One or more joints may be involved. There are a number of types of arthritis. Your caregiver may not be able to tell what type of arthritis you have right away. CAUSES  The most common cause of arthritis is the wear and tear on the joint (osteoarthritis). This causes damage to the cartilage, which can break down over time. The knees, hips, back and neck are most often affected by this type of arthritis. Other types of arthritis and common causes of joint pain include:  Sprains and other injuries near the joint. Sometimes minor sprains and injuries cause pain and swelling that develop hours later.   Rheumatoid arthritis. This affects hands, feet and knees. It usually affects both sides of your body at the same time. It is often associated with chronic ailments, fever, weight loss and general weakness.   Crystal arthritis. Gout and pseudo gout can cause occasional acute severe pain, redness and swelling in the foot, ankle, or knee.   Infectious arthritis. Bacteria can get into a joint through a break in overlying skin. This can cause infection of the joint. Bacteria and viruses can also spread through the blood and affect your joints.   Drug, infectious and allergy reactions. Sometimes joints can become mildly painful and slightly swollen with these types of illnesses.  SYMPTOMS   Pain is the main symptom.   Your joint or joints can also be red, swollen and warm or hot to the touch.   You may have a fever with certain types of arthritis, or even feel overall ill.   The joint with arthritis will hurt with movement. Stiffness is present with some types of arthritis.  DIAGNOSIS  Your caregiver will suspect arthritis based on your description of your symptoms and on your exam. Testing may be needed to find the type of arthritis:  Blood and sometimes urine tests.    X-ray tests and sometimes CT or MRI scans.   Removal of fluid from the joint (arthrocentesis) is done to check for bacteria, crystals or other causes. Your caregiver (or a specialist) will numb the area over the joint with a local anesthetic, and use a needle to remove joint fluid for examination. This procedure is only minimally uncomfortable.   Even with these tests, your caregiver may not be able to tell what kind of arthritis you have. Consultation with a specialist (rheumatologist) may be helpful.  TREATMENT  Your caregiver will discuss with you treatment specific to your type of arthritis. If the specific type cannot be determined, then the following general recommendations may apply. Treatment of severe joint pain includes:  Rest.   Elevation.   Anti-inflammatory medication (for example, ibuprofen) may be prescribed. Avoiding activities that cause increased pain.   Only take over-the-counter or prescription medicines for pain and discomfort as recommended by your caregiver.   Cold packs over an inflamed joint may be used for 10 to 15 minutes every hour. Hot packs sometimes feel better, but do not use overnight. Do not use hot packs if you are diabetic without your caregiver's permission.   A cortisone shot into arthritic joints may help reduce pain and swelling.   Any acute arthritis that gets worse over the next 1 to 2 days needs to be looked at to be sure there is no joint infection.  Long-term arthritis treatment involves modifying activities and lifestyle to reduce joint stress jarring. This can  include weight loss. Also, exercise is needed to nourish the joint cartilage and remove waste. This helps keep the muscles around the joint strong. HOME CARE INSTRUCTIONS   Do not take aspirin to relieve pain if gout is suspected. This elevates uric acid levels.   Only take over-the-counter or prescription medicines for pain, discomfort or fever as directed by your caregiver.    Rest the joint as much as possible.   If your joint is swollen, keep it elevated.   Use crutches if the painful joint is in your leg.   Drinking plenty of fluids may help for certain types of arthritis.   Follow your caregiver's dietary instructions.   Try low-impact exercise such as:   Swimming.   Water aerobics.   Biking.   Walking.   Morning stiffness is often relieved by a warm shower.   Put your joints through regular range-of-motion.  SEEK MEDICAL CARE IF:   You do not feel better in 24 hours or are getting worse.   You have side effects to medications, or are not getting better with treatment.  SEEK IMMEDIATE MEDICAL CARE IF:   You have a fever.   You develop severe joint pain, swelling or redness.   Many joints are involved and become painful and swollen.   There is severe back pain and/or leg weakness.   You have loss of bowel or bladder control.  Document Released: 06/06/2004 Document Revised: 04/18/2011 Document Reviewed: 06/22/2008 [facility] Patient Information 2012 [place], [place].Degenerative Disc Disease Degenerative disc disease is a condition caused by the changes that occur in the cushions of the backbone (spinal discs) as you grow older. Spinal discs are soft and compressible discs located between the bones of the spine (vertebrae). They act like shock absorbers. Degenerative disc disease can affect the wholespine. However, the neck and lower back are most commonly affected. Many changes can occur in the spinal discs with aging, such as:  The spinal discs may dry and shrink.   Small tears may occur in the tough, outer covering of the disc (annulus).   The disc space may become smaller due to loss of water.   Abnormal growths in the bone (spurs) may occur. This can put pressure on the nerve roots exiting the spinal canal, causing pain.   The spinal canal may become narrowed.  CAUSES  Degenerative disc disease is a condition caused by the  changes that occur in the spinal discs with aging. The exact cause is not known, but there is a genetic basis for many patients. Degenerative changes can occur due to loss of fluid in the disc. This makes the disc thinner and reduces the space between the backbones. Small cracks can develop in the outer layer of the disc. This can lead to the breakdown of the disc. You are more likely to get degenerative disc disease if you are overweight. Smoking cigarettes and doing heavy work such as weightlifting can also increase your risk of this condition. Degenerative changes can start after a sudden injury. Growth of bone spurs can compress the nerve roots and cause pain.  SYMPTOMS  The symptoms vary from person to person. Some people may have no pain, while others have severe pain. The pain may be so severe that it can limit your activities. The location of the pain depends on the part of your backbone that is affected. You will have neck or arm pain if a disc in the neck area is affected. You will have pain in  your back, buttocks, or legs if a disc in the lower back is affected. The pain becomes worse while bending, reaching up, or with twisting movements. The pain may start gradually and then get worse as time passes. It may also start after a major or minor injury. You may feel numbness or tingling in the arms or legs.  DIAGNOSIS  Your caregiver will ask you about your symptoms and about activities or habits that may cause the pain. He or she may also ask about any injuries, diseases, ortreatments you have had earlier. Your caregiver will examine you to check for the range of movement that is possible in the affected area, to check for strength in your extremities, and to check for sensation in the areas of the arms and legs supplied by different nerve roots. An X-ray of the spine may be taken. Your caregiver may suggest other imaging tests, such as a computerized magnetic scan (MRI), if needed.  TREATMENT   Treatment includes rest, modifying your activities, and applying ice and heat. Your caregiver may prescribe medicines to reduce your pain and may ask you to do some exercises to strengthen your back. In some cases, you may need surgery. You and your caregiver will decide on the treatment that is best for you. HOME CARE INSTRUCTIONS   Follow proper lifting and walking techniques as advised by your caregiver.   Maintain good posture.   Exercise regularly as advised.   Perform relaxation exercises.   Change your sitting, standing, and sleeping habits as advised. Change positions frequently.   Lose weight as advised.   Stop smoking if you smoke.   Wear supportive footwear.  SEEK MEDICAL CARE IF:  The pain does not go away within 1 to 4 weeks. SEEK IMMEDIATE MEDICAL CARE IF:   The pain is severe.   You notice weakness in your arms, hands, or legs.   You begin to lose control of your bladder or bowel.  MAKE SURE YOU:   Understand these instructions.   Will watch your condition.   Will get help right away if you are not doing well or get worse.  Document Released: 02/24/2007 Document Revised: 04/18/2011 Document Reviewed: 02/24/2007 [facility] Patient Information 2012 [place], [place].

## 2011-10-05 NOTE — ED Notes (Signed)
PA made aware of pt blood pressure. PA writing prescription for BP medication and pt encouraged to follow up with PCP about high blood pressures. Pt states she has been out of this medication for a long time and has been unable to see PCP.

## 2011-10-13 NOTE — ED Provider Notes (Signed)
Medical screening examination/treatment/procedure(s) were performed by non-physician practitioner and as supervising physician I was immediately available for consultation/collaboration.  Donnetta Hutching, MD 10/13/11 [phone]

## 2012-05-22 ENCOUNTER — Encounter (HOSPITAL_COMMUNITY): Payer: Self-pay | Admitting: Emergency Medicine

## 2012-05-22 ENCOUNTER — Emergency Department (HOSPITAL_COMMUNITY)
Admission: EM | Admit: 2012-05-22 | Discharge: 2012-05-23 | Disposition: A | Discharge status: Home / Self Care | Payer: Self-pay | Attending: Emergency Medicine | Admitting: Emergency Medicine

## 2012-05-22 DIAGNOSIS — F17200 Nicotine dependence, unspecified, uncomplicated: Secondary | ICD-10-CM | POA: Insufficient documentation

## 2012-05-22 DIAGNOSIS — I10 Essential (primary) hypertension: Secondary | ICD-10-CM | POA: Insufficient documentation

## 2012-05-22 DIAGNOSIS — F3289 Other specified depressive episodes: Secondary | ICD-10-CM | POA: Insufficient documentation

## 2012-05-22 DIAGNOSIS — Z3202 Encounter for pregnancy test, result negative: Secondary | ICD-10-CM | POA: Insufficient documentation

## 2012-05-22 DIAGNOSIS — F329 Major depressive disorder, single episode, unspecified: Secondary | ICD-10-CM

## 2012-05-22 HISTORY — DX: Major depressive disorder, single episode, unspecified: F32.9

## 2012-05-22 HISTORY — DX: Mental disorder, not otherwise specified: F99

## 2012-05-22 HISTORY — DX: Depression, unspecified: F32.A

## 2012-05-22 LAB — COMPREHENSIVE METABOLIC PANEL
AST: 35 U/L (ref 0–37)
BUN: 9 mg/dL (ref 6–23)
CO2: 21 mEq/L (ref 19–32)
Chloride: 107 mEq/L (ref 96–112)
Creatinine, Ser: 0.62 mg/dL (ref 0.50–1.10)
GFR calc non Af Amer: >90 mL/min (ref >90)
Total Bilirubin: 0.1 mg/dL — ABNORMAL LOW (ref 0.3–1.2)

## 2012-05-22 LAB — CBC
HCT: 37.4 % (ref 36.0–46.0)
Hemoglobin: 12.7 g/dL (ref 12.0–15.0)
MCV: 97.4 fL (ref 78.0–100.0)
RBC: 3.84 MIL/uL — ABNORMAL LOW (ref 3.87–5.11)
WBC: 3.9 10*3/uL — ABNORMAL LOW (ref 4.0–10.5)

## 2012-05-22 LAB — SALICYLATE LEVEL: Salicylate Lvl: 21.1 mg/dL — ABNORMAL HIGH (ref 2.8–20.0)

## 2012-05-22 LAB — ETHANOL: Alcohol, Ethyl (B): 256 mg/dL — ABNORMAL HIGH (ref 0–11)

## 2012-05-22 LAB — RAPID URINE DRUG SCREEN, HOSP PERFORMED: Amphetamines: NOT DETECTED

## 2012-05-22 LAB — POCT PREGNANCY, URINE: Preg Test, Ur: NEGATIVE

## 2012-05-22 MED ORDER — ACETAMINOPHEN 325 MG PO TABS: 650.0000 mg | ORAL_TABLET | ORAL | Status: DC | PRN

## 2012-05-22 MED ORDER — ZIPRASIDONE MESYLATE 20 MG IM SOLR
INTRAMUSCULAR | Status: AC
  Administered 2012-05-22: 20 mg via INTRAMUSCULAR
  Filled 2012-05-22: qty 20

## 2012-05-22 MED ORDER — LORAZEPAM 1 MG PO TABS: 1.0000 mg | ORAL_TABLET | Freq: Three times a day (TID) | ORAL | Status: DC | PRN

## 2012-05-22 MED ORDER — ALUM & MAG HYDROXIDE-SIMETH 200-200-20 MG/5ML PO SUSP: 30.0000 mL | ORAL | Status: DC | PRN

## 2012-05-22 MED ORDER — ZOLPIDEM TARTRATE 5 MG PO TABS: 5.0000 mg | ORAL_TABLET | Freq: Every evening | ORAL | Status: DC | PRN

## 2012-05-22 MED ORDER — NICOTINE 21 MG/24HR TD PT24: 21.0000 mg | MEDICATED_PATCH | Freq: Every day | TRANSDERMAL | Status: DC

## 2012-05-22 MED ORDER — ONDANSETRON HCL 4 MG PO TABS: 4.0000 mg | ORAL_TABLET | Freq: Three times a day (TID) | ORAL | Status: DC | PRN

## 2012-05-22 MED ORDER — IBUPROFEN 600 MG PO TABS: 600.0000 mg | ORAL_TABLET | Freq: Three times a day (TID) | ORAL | Status: DC | PRN

## 2012-05-22 MED ORDER — ZIPRASIDONE MESYLATE 20 MG IM SOLR
20.0000 mg | Freq: Once | INTRAMUSCULAR | Status: AC
  Administered 2012-05-22: 20 mg via INTRAMUSCULAR

## 2012-05-22 NOTE — ED Notes (Signed)
Patient and belongings wanded by security. One bag of belongings placed behind triage nursing station. Patient changed into blue scrubs and red socks.

## 2012-05-22 NOTE — ED Notes (Signed)
telepsych info called into [facility], there are about 4 ahead of this pt so it will probably be a couple hours per [facility] rep.  

## 2012-05-22 NOTE — ED Notes (Addendum)
Pt presenting to ed with c/o brought in by [employer] per officer pt stated she wanted to blow her brains out. Pt states "if I had a 9 I would have blew my brains out and left my thoughts for somebody else" pt states sometimes she thinks about taking other medications. Pt states she is not having any thoughts of hurting herself at this time. Pt states sometimes she hears voices

## 2012-05-22 NOTE — ED Provider Notes (Signed)
History     CSN: [id number]  Arrival date & time 05/22/12  1642   First MD Initiated Contact with Patient 05/22/12 1736      Chief Complaint  Patient presents with  . Medical Clearance    (Consider location/radiation/quality/duration/timing/severity/associated sxs/prior treatment) HPI Comments: Patient presents to the ED voluntarily.  She was brought in by the [employer] after calling them and expressing suicidal thoughts.  She told nursing staff that she wants to "blow her brains out."  She stated that if she had access to a 9 she would have done it.   Patient is very agitated and angry.  Unable to obtain further history from the patient.  She does have a Psychiatric history.  She denies alcohol or recreational drug use.    The history is provided by the patient.    Past Medical History  Diagnosis Date  . Hypertension     Past Surgical History  Procedure Date  . Cystectomy   . Tubal ligation     No family history on file.  History  Substance Use Topics  . Smoking status: Current Every Day Smoker -- 3.0 packs/day    Types: Cigarettes  . Smokeless tobacco: Not on file  . Alcohol Use: Yes     Comment: "every blue moon"    OB History    Grav Para Term Preterm Abortions TAB SAB Ect Mult Living                  Review of Systems  Unable to perform ROS: Psychiatric disorder    Allergies  Penicillins  Home Medications   Current Outpatient Rx  Name  Route  Sig  Dispense  Refill  . HYDROCHLOROTHIAZIDE 25 MG PO TABS   Oral   Take 1 tablet (25 mg total) by mouth daily.   30 tablet   0     BP 109/88  Pulse 91  Temp 98.8 F (37.1 C) (Oral)  Resp 18  SpO2 100%  Physical Exam  Nursing note and vitals reviewed. Constitutional: She appears well-developed and well-nourished.  HENT:  Head: Normocephalic and atraumatic.  Mouth/Throat: Oropharynx is clear and moist.  Neck: Normal range of motion. Neck supple.  Cardiovascular: Normal rate, regular rhythm and normal  heart sounds.   Pulmonary/Chest: Effort normal and breath sounds normal.  Neurological: She is alert.  Skin: Skin is warm and dry.  Psychiatric: Her affect is angry, labile and inappropriate. Her speech is rapid and/or pressured. She is agitated and aggressive. She expresses suicidal ideation. She expresses suicidal plans.    ED Course  Procedures (including critical care time)   Labs Reviewed  ACETAMINOPHEN LEVEL  CBC  COMPREHENSIVE METABOLIC PANEL  ETHANOL  SALICYLATE LEVEL  URINE RAPID DRUG SCREEN (HOSP PERFORMED)   No results found.   No diagnosis found.    MDM  Patient present to the ED due to suicidal thoughts.  Patient very agitated and aggressive in triage.  Patient given Geodon IM.  Dr. Juleen China notified of the patient.  ACT team also notified.  IVC paperwork completed.  Psych holding orders have been placed.          Pascal Lux [place], PA-C 05/23/12 0028

## 2012-05-23 ENCOUNTER — Encounter (HOSPITAL_COMMUNITY): Payer: Self-pay | Admitting: *Deleted

## 2012-05-23 MED ORDER — CITALOPRAM HYDROBROMIDE 20 MG PO TABS: 20.0000 mg | ORAL_TABLET | Freq: Every day | ORAL | Status: DC

## 2012-05-23 NOTE — BH Assessment (Addendum)
Assessment Note   [name] is a 52 y.o. female who presents to [facility] with SI and plan to overdose and admits to drinking 1/2 of 40oz beer.  Pt says SI was triggered by conversation with her son.  Pt says son asked her why she abandoned him and moved in with her boyfriend.  Pt says she didn't realize she hurt him.  Pt has past hx of self harm by overdose(4x's).  Pt has been off meds x6 mos and reports hearing voices with command to harm self and others and is paranoid that others are after her.  Pt has no specific plan to harm anyone else.  Pt says she doesn't want to stay in the hospital and no longer endorses SI.  Pt has past inpt admissions with [facility], [facility] and [facility].        Axis I: Bipolar D/O, Depressed w/psych features Axis II: Deferred Axis III:  Past Medical History  Diagnosis Date  . Hypertension   . Mental disorder   . Depression    Axis IV: other psychosocial or environmental problems, problems related to social environment and problems with primary support group Axis V: 31-40 impairment in reality testing  Past Medical History:  Past Medical History  Diagnosis Date  . Hypertension   . Mental disorder   . Depression     Past Surgical History  Procedure Date  . Cystectomy   . Tubal ligation     Family History: No family history on file.  Social History:  reports that she has been smoking Cigarettes.  She has been smoking about 3 packs per day. She does not have any smokeless tobacco history on file. She reports that she drinks alcohol. She reports that she uses illicit drugs (Marijuana).  Additional Social History:  Alcohol / Drug Use Pain Medications: None  Prescriptions: See MAR  Over the Counter: None  History of alcohol / drug use?: Yes Longest period of sobriety (when/how long): Pt uses THC   CIWA: CIWA-Ar BP: 103/67 mmHg Pulse Rate: 86  COWS:    Allergies:  Allergies  Allergen Reactions  . Penicillins     REACTION: swelling of tongue;  hives    Home Medications:  (Not in a hospital admission)  OB/GYN Status:  No LMP recorded. Patient is not currently having periods (Reason: Perimenopausal).  General Assessment Data Location of Assessment: [facility] Living Arrangements: Parent (Lives with mother ) Can pt return to current living arrangement?: Yes Admission Status: Voluntary Is patient capable of signing voluntary admission?: Yes Transfer from: Acute Hospital Referral Source: MD  Education Status Is patient currently in school?: No Current Grade: None  Highest grade of school patient has completed: None  Name of school: None  Contact person: None   Risk to self Suicidal Ideation: No-Not Currently/Within Last 6 Months Suicidal Intent: No-Not Currently/Within Last 6 Months Is patient at risk for suicide?: Yes Suicidal Plan?: Yes-Currently Present Specify Current Suicidal Plan: "I usually overdose".   Access to Means: Yes Specify Access to Suicidal Means: Pills, Medications  What has been your use of drugs/alcohol within the last 12 months?: Pt uses THC  Previous Attempts/Gestures: Yes How many times?: 4  Other Self Harm Risks: None  Triggers for Past Attempts: Unpredictable;Family contact Intentional Self Injurious Behavior: None Family Suicide History: No Recent stressful life event(s): Conflict (Comment) (Issue with son) Persecutory voices/beliefs?: No Depression: Yes Depression Symptoms: Loss of interest in usual pleasures;Guilt;Feeling worthless/self pity Substance abuse history and/or treatment for  substance abuse?: Yes Suicide prevention information given to non-admitted patients: Not applicable  Risk to Others Homicidal Ideation: No Thoughts of Harm to Others: No Current Homicidal Intent: No Current Homicidal Plan: No Access to Homicidal Means: No Identified Victim: None  History of harm to others?: No Assessment of Violence: None Noted Violent Behavior Description: None  Does patient have  access to weapons?: No Criminal Charges Pending?: No Does patient have a court date: No Court Date:  (None )  Psychosis Hallucinations: None noted;Visual;With command (To harm self and others ) Delusions: None noted  Mental Status Report Appear/Hygiene: Disheveled Eye Contact: Fair Motor Activity: Agitation Speech: Logical/coherent;Slurred Level of Consciousness: Alert Mood: Depressed Affect: Depressed Anxiety Level: None Thought Processes: Coherent;Relevant Judgement: Impaired Orientation: Person;Place;Time;Situation Obsessive Compulsive Thoughts/Behaviors: None  Cognitive Functioning Concentration: Normal Memory: Recent Intact;Remote Intact IQ: Average Insight: Poor Impulse Control: Poor Appetite: Good Weight Loss: 0  Weight Gain: 0  Sleep: No Change Total Hours of Sleep:  (Intermittent ) Vegetative Symptoms: None  ADLScreening [facility] Assessment Services) Patient's cognitive ability adequate to safely complete daily activities?: Yes Patient able to express need for assistance with ADLs?: Yes Independently performs ADLs?: Yes (appropriate for developmental age)  Abuse/Neglect [facility]) Physical Abuse: Denies Verbal Abuse: Denies Sexual Abuse: Denies  Prior Inpatient Therapy Prior Inpatient Therapy: Yes Prior Therapy Dates: 2001, 2009 Prior Therapy Facilty/Provider(s): [facility], [facility], [facility]  Reason for Treatment: SI/Depression   Prior Outpatient Therapy Prior Outpatient Therapy: Yes Prior Therapy Dates: Current  Prior Therapy Facilty/Provider(s): [facility]  Reason for Treatment: Med Mgt   ADL Screening (condition at time of admission) Patient's cognitive ability adequate to safely complete daily activities?: Yes Patient able to express need for assistance with ADLs?: Yes Independently performs ADLs?: Yes (appropriate for developmental age) Weakness of Legs: None Weakness of Arms/Hands: None  Home Assistive Devices/Equipment Home Assistive  Devices/Equipment: None  Therapy Consults (therapy consults require a physician order) PT Evaluation Needed: No OT Evalulation Needed: No SLP Evaluation Needed: No Abuse/Neglect Assessment (Assessment to be complete while patient is alone) Physical Abuse: Denies Verbal Abuse: Denies Sexual Abuse: Denies Exploitation of patient/patient's resources: Denies Self-Neglect: Denies Values / Beliefs Cultural Requests During Hospitalization: None Spiritual Requests During Hospitalization: None Consults Spiritual Care Consult Needed: No Social Work Consult Needed: No Merchant navy officer (For Healthcare) Advance Directive: Patient does not have advance directive;Patient would not like information Pre-existing out of facility DNR order (yellow form or pink MOST form): No Nutrition Screen- [facility] Adult/[facility] Patient's home diet: Regular Have you recently lost weight without trying?: No Have you been eating poorly because of a decreased appetite?: No Malnutrition Screening Tool Score: 0   Additional Information 1:1 In Past 12 Months?: No CIRT Risk: No Elopement Risk: No Does patient have medical clearance?: Yes     Disposition:  Disposition Disposition of Patient: Inpatient treatment program;Referred to [facility] ) Type of inpatient treatment program: Adult Patient referred to: Other (Comment) [facility] )  On Site Evaluation by:   Reviewed with Physician:     Murrell Redden 05/23/2012 1:27 AM

## 2012-05-23 NOTE — ED Notes (Signed)
Pt is discharging home per MD orders. One bag of personal belongings given back to pt. Daughter is supposed to be picking her up. Will go over discharge papers with her.

## 2012-05-23 NOTE — ED Provider Notes (Signed)
PT is voluntary, evaluated by [facility] Dr Trisha Mangle who recommends OK for d/c home with outpatient referrals for community mental health follow up  Sunnie Nielsen, MD 05/23/12 [phone]

## 2012-05-23 NOTE — ED Provider Notes (Signed)
Medical screening examination/treatment/procedure(s) were performed by non-physician practitioner and as supervising physician I was immediately available for consultation/collaboration.  Jhony Antrim, MD 05/23/12 0103 

## 2012-05-23 NOTE — ED Notes (Signed)
Officer Louann Liv [phone] called here stating he has pt's keys and some other belongings as well but he didn't realize it until he got home. At the time we thought pt was staying here for placement, however, she's been cleared by telepsych and her daughter is picking her up and taking to her to [address] [place] in [facility], pt's phone # there is [phone]. Writer left a message for the officer to try to let him know pt discharging and we needed him to bring belongings here but he hasn't called back. When he does, please give him above pt information so he can take her belongings to her home.

## 2013-01-06 ENCOUNTER — Emergency Department (HOSPITAL_COMMUNITY)
Admission: EM | Admit: 2013-01-06 | Discharge: 2013-01-07 | Disposition: A | Discharge status: Other Acute Inpatient Hospital | Payer: No Typology Code available for payment source | Attending: Emergency Medicine | Admitting: Emergency Medicine

## 2013-01-06 ENCOUNTER — Encounter (HOSPITAL_COMMUNITY): Payer: Self-pay | Admitting: Emergency Medicine

## 2013-01-06 DIAGNOSIS — F1020 Alcohol dependence, uncomplicated: Secondary | ICD-10-CM

## 2013-01-06 DIAGNOSIS — Z8659 Personal history of other mental and behavioral disorders: Secondary | ICD-10-CM | POA: Insufficient documentation

## 2013-01-06 DIAGNOSIS — Z79899 Other long term (current) drug therapy: Secondary | ICD-10-CM | POA: Insufficient documentation

## 2013-01-06 DIAGNOSIS — F17200 Nicotine dependence, unspecified, uncomplicated: Secondary | ICD-10-CM | POA: Insufficient documentation

## 2013-01-06 DIAGNOSIS — G479 Sleep disorder, unspecified: Secondary | ICD-10-CM | POA: Insufficient documentation

## 2013-01-06 DIAGNOSIS — Z880 Allergy status to penicillin: Secondary | ICD-10-CM | POA: Insufficient documentation

## 2013-01-06 DIAGNOSIS — F600 Paranoid personality disorder: Secondary | ICD-10-CM | POA: Insufficient documentation

## 2013-01-06 DIAGNOSIS — I10 Essential (primary) hypertension: Secondary | ICD-10-CM | POA: Insufficient documentation

## 2013-01-06 DIAGNOSIS — F319 Bipolar disorder, unspecified: Secondary | ICD-10-CM | POA: Insufficient documentation

## 2013-01-06 DIAGNOSIS — F1010 Alcohol abuse, uncomplicated: Secondary | ICD-10-CM | POA: Insufficient documentation

## 2013-01-06 DIAGNOSIS — F3189 Other bipolar disorder: Secondary | ICD-10-CM | POA: Diagnosis present

## 2013-01-06 DIAGNOSIS — H11419 Vascular abnormalities of conjunctiva, unspecified eye: Secondary | ICD-10-CM | POA: Insufficient documentation

## 2013-01-06 DIAGNOSIS — F329 Major depressive disorder, single episode, unspecified: Secondary | ICD-10-CM

## 2013-01-06 DIAGNOSIS — F22 Delusional disorders: Secondary | ICD-10-CM | POA: Insufficient documentation

## 2013-01-06 DIAGNOSIS — R45851 Suicidal ideations: Secondary | ICD-10-CM | POA: Insufficient documentation

## 2013-01-06 DIAGNOSIS — R443 Hallucinations, unspecified: Secondary | ICD-10-CM | POA: Insufficient documentation

## 2013-01-06 HISTORY — DX: Bipolar disorder, unspecified: F31.9

## 2013-01-06 HISTORY — DX: Schizophrenia, unspecified: F20.9

## 2013-01-06 LAB — COMPREHENSIVE METABOLIC PANEL
AST: 27 U/L (ref 0–37)
Albumin: 4.1 g/dL (ref 3.5–5.2)
BUN: 7 mg/dL (ref 6–23)
CO2: 24 mEq/L (ref 19–32)
Calcium: 9.8 mg/dL (ref 8.4–10.5)
Creatinine, Ser: 0.58 mg/dL (ref 0.50–1.10)
GFR calc non Af Amer: >90 mL/min (ref >90)

## 2013-01-06 LAB — CBC
HCT: 40.2 % (ref 36.0–46.0)
MCH: 34.1 pg — ABNORMAL HIGH (ref 26.0–34.0)
MCV: 97.1 fL (ref 78.0–100.0)
Platelets: 218 10*3/uL (ref 150–400)
RDW: 14.1 % (ref 11.5–15.5)

## 2013-01-06 LAB — RAPID URINE DRUG SCREEN, HOSP PERFORMED
Amphetamines: NOT DETECTED
Opiates: NOT DETECTED

## 2013-01-06 LAB — ETHANOL: Alcohol, Ethyl (B): 339 mg/dL — ABNORMAL HIGH (ref 0–11)

## 2013-01-06 LAB — SALICYLATE LEVEL: Salicylate Lvl: <2 mg/dL — ABNORMAL LOW (ref 2.8–20.0)

## 2013-01-06 MED ORDER — ACETAMINOPHEN 325 MG PO TABS: 650.0000 mg | ORAL_TABLET | ORAL | Status: DC | PRN

## 2013-01-06 MED ORDER — ZOLPIDEM TARTRATE 5 MG PO TABS: 5.0000 mg | ORAL_TABLET | Freq: Every evening | ORAL | Status: DC | PRN

## 2013-01-06 MED ORDER — IBUPROFEN 200 MG PO TABS: 600.0000 mg | ORAL_TABLET | Freq: Three times a day (TID) | ORAL | Status: DC | PRN

## 2013-01-06 MED ORDER — ZIPRASIDONE MESYLATE 20 MG IM SOLR
20.0000 mg | Freq: Once | INTRAMUSCULAR | Status: AC
  Administered 2013-01-06: 20 mg via INTRAMUSCULAR
  Filled 2013-01-06: qty 20

## 2013-01-06 MED ORDER — LORAZEPAM 2 MG/ML IJ SOLN
2.0000 mg | Freq: Once | INTRAMUSCULAR | Status: AC
  Administered 2013-01-06: 2 mg via INTRAMUSCULAR
  Filled 2013-01-06: qty 1

## 2013-01-06 MED ORDER — LORAZEPAM 1 MG PO TABS: 1.0000 mg | ORAL_TABLET | Freq: Three times a day (TID) | ORAL | Status: DC | PRN

## 2013-01-06 MED ORDER — DIPHENHYDRAMINE HCL 50 MG/ML IJ SOLN
50.0000 mg | Freq: Once | INTRAMUSCULAR | Status: AC
  Administered 2013-01-06: 50 mg via INTRAMUSCULAR
  Filled 2013-01-06: qty 1

## 2013-01-06 MED ORDER — NICOTINE 21 MG/24HR TD PT24
21.0000 mg | MEDICATED_PATCH | Freq: Every day | TRANSDERMAL | Status: DC
  Administered 2013-01-07: 21 mg via TRANSDERMAL
  Filled 2013-01-06: qty 1

## 2013-01-06 NOTE — ED Provider Notes (Signed)
Medical screening examination/treatment/procedure(s) were performed by non-physician practitioner and as supervising physician I was immediately available for consultation/collaboration.   Gilda Crease, MD 01/06/13 [phone]

## 2013-01-06 NOTE — ED Notes (Signed)
Pt changed into scrubs, urine obtained, eyebrow ring removed. 2 yellow earrings removed. Pt walked to [facility] with [employer] and security.

## 2013-01-06 NOTE — ED Notes (Signed)
Pt states she wants to die, pt states she has hx of same. Pt admits to visual and auditory hallucinations. Pt states voices are telling her to kill herself and others. Pt states if she had a gun she would "blow my brains out and yours too", pt states since she doesn't have gun she would take pills. 2 -40 oz beer today, denies drugs. Pt behavior anxious and rambling. [employer] at bedside.

## 2013-01-06 NOTE — ED Notes (Signed)
Report called to Ras[place],RN in [facility].

## 2013-01-06 NOTE — ED Provider Notes (Signed)
This chart was scribed for Earley Favor NP, a non-physician practitioner working with Gilda Crease, * by Lewanda Rife, ED Scribe. This patient was seen in room [facility]3/[facility] and the patient's care was started at 2154.    CSN: [id number]     Arrival date & time 01/06/13  2045 History   First MD Initiated Contact with Patient 01/06/13 2152     Chief Complaint  Patient presents with  . Medical Clearance   (Consider location/radiation/quality/duration/timing/severity/associated sxs/prior Treatment) The history is provided by the patient.   HPI Comments: [name] is a 52 y.o. female who presents to the Emergency Department complaining of auditory hallucinations onset since running out of medications over a year ago. Reports multiple suicide attempts in the past. Denies suicidal ideations at this time states she "is not going to hurt herself".  Denies HI. Reports she drinks malt beverages everyday.  Reports hx of alcoholism, hypertension, schizophrenia, bipolar, and depression.   Past Medical History  Diagnosis Date  . Hypertension   . Mental disorder   . Depression   . Bipolar 1 disorder   . Schizophrenia    Past Surgical History  Procedure Laterality Date  . Cystectomy    . Tubal ligation     No family history on file. History  Substance Use Topics  . Smoking status: Current Every Day Smoker -- 3.00 packs/day    Types: Cigarettes  . Smokeless tobacco: Not on file  . Alcohol Use: Yes     Comment: every other day   OB History   Grav Para Term Preterm Abortions TAB SAB Ect Mult Living                 Review of Systems  Psychiatric/Behavioral: Positive for hallucinations and sleep disturbance.  All other systems reviewed and are negative.   A complete 10 system review of systems was obtained and all systems are negative except as noted in the HPI and PMH.   Allergies  Penicillins  Home Medications   Current Outpatient Rx  Name  Route  Sig  Dispense   Refill  . citalopram (CELEXA) 20 MG tablet   Oral   Take 1 tablet (20 mg total) by mouth daily.   14 tablet   0    BP 133/100  Pulse 74  Temp(Src) 97.9 F (36.6 C) (Oral)  Resp 16  Ht 5\' 4"  (1.626 m)  Wt 136 lb 7 oz (61.888 kg)  BMI 23.41 kg/m2  SpO2 100% Physical Exam  Nursing note and vitals reviewed. Constitutional: She is oriented to person, place, and time. She appears well-developed and well-nourished. No distress.  HENT:  Head: Normocephalic and atraumatic.  Eyes: EOM are normal. Pupils are equal, round, and reactive to light. Right conjunctiva is injected. Left conjunctiva is injected.  Neck: Normal range of motion. Neck supple.  Cardiovascular: Normal rate, regular rhythm and normal heart sounds.   Pulmonary/Chest: Effort normal and breath sounds normal. No respiratory distress.  Musculoskeletal: Normal range of motion.  Neurological: She is alert and oriented to person, place, and time.  Skin: Skin is warm and dry.  Psychiatric: She has a normal mood and affect. Her speech is rapid and/or pressured. She is actively hallucinating. Thought content is paranoid and delusional. Cognition and memory are impaired. She expresses inappropriate judgment.    ED Course  Procedures (including critical care time) Medications - No data to display  Labs Review Labs Reviewed  CBC - Abnormal; Notable for the following:  MCH 34.1 (*)    All other components within normal limits  COMPREHENSIVE METABOLIC PANEL - Abnormal; Notable for the following:    Total Bilirubin 0.1 (*)    All other components within normal limits  ETHANOL - Abnormal; Notable for the following:    Alcohol, Ethyl (B) 339 (*)    All other components within normal limits  SALICYLATE LEVEL - Abnormal; Notable for the following:    Salicylate Lvl <2.0 (*)    All other components within normal limits  ACETAMINOPHEN LEVEL  URINE RAPID DRUG SCREEN (HOSP PERFORMED)   Imaging Review No results found.  MDM   No diagnosis found. Will have psy assessment   I personally performed the services described in this documentation, which was scribed in my presence. The recorded information has been reviewed and is accurate.  Arman Filter, NP 01/06/13 2246

## 2013-01-07 ENCOUNTER — Encounter (HOSPITAL_COMMUNITY): Payer: Self-pay | Admitting: *Deleted

## 2013-01-07 ENCOUNTER — Encounter (HOSPITAL_COMMUNITY): Payer: Self-pay | Admitting: Registered Nurse

## 2013-01-07 ENCOUNTER — Inpatient Hospital Stay (HOSPITAL_COMMUNITY)
Admission: AD | Admit: 2013-01-07 | Discharge: 2013-01-12 | DRG: 885 | Disposition: A | Discharge status: Home / Self Care | Payer: No Typology Code available for payment source | Source: Intra-hospital | Attending: Psychiatry | Admitting: Psychiatry

## 2013-01-07 DIAGNOSIS — F1210 Cannabis abuse, uncomplicated: Secondary | ICD-10-CM | POA: Diagnosis present

## 2013-01-07 DIAGNOSIS — F17200 Nicotine dependence, unspecified, uncomplicated: Secondary | ICD-10-CM | POA: Diagnosis present

## 2013-01-07 DIAGNOSIS — R45851 Suicidal ideations: Secondary | ICD-10-CM

## 2013-01-07 DIAGNOSIS — F319 Bipolar disorder, unspecified: Secondary | ICD-10-CM | POA: Diagnosis present

## 2013-01-07 DIAGNOSIS — F411 Generalized anxiety disorder: Secondary | ICD-10-CM | POA: Diagnosis present

## 2013-01-07 DIAGNOSIS — G4700 Insomnia, unspecified: Secondary | ICD-10-CM | POA: Diagnosis present

## 2013-01-07 DIAGNOSIS — I10 Essential (primary) hypertension: Secondary | ICD-10-CM | POA: Diagnosis present

## 2013-01-07 DIAGNOSIS — F3189 Other bipolar disorder: Secondary | ICD-10-CM | POA: Diagnosis present

## 2013-01-07 DIAGNOSIS — F22 Delusional disorders: Secondary | ICD-10-CM | POA: Diagnosis present

## 2013-01-07 DIAGNOSIS — F29 Unspecified psychosis not due to a substance or known physiological condition: Secondary | ICD-10-CM

## 2013-01-07 DIAGNOSIS — F1010 Alcohol abuse, uncomplicated: Secondary | ICD-10-CM | POA: Diagnosis present

## 2013-01-07 DIAGNOSIS — F259 Schizoaffective disorder, unspecified: Principal | ICD-10-CM | POA: Diagnosis present

## 2013-01-07 MED ORDER — LABETALOL HCL 100 MG PO TABS
100.0000 mg | ORAL_TABLET | Freq: Once | ORAL | Status: AC
  Administered 2013-01-07: 100 mg via ORAL
  Filled 2013-01-07 (×2): qty 1

## 2013-01-07 MED ORDER — LORAZEPAM 1 MG PO TABS
1.0000 mg | ORAL_TABLET | Freq: Three times a day (TID) | ORAL | Status: DC | PRN
Start: 2013-01-07 — End: 2013-01-12

## 2013-01-07 MED ORDER — NICOTINE 21 MG/24HR TD PT24
21.0000 mg | MEDICATED_PATCH | Freq: Every day | TRANSDERMAL | Status: DC
  Administered 2013-01-09 – 2013-01-11 (×3): 21 mg via TRANSDERMAL
  Filled 2013-01-07 (×8): qty 1

## 2013-01-07 MED ORDER — HYDROCHLOROTHIAZIDE 25 MG PO TABS
25.0000 mg | ORAL_TABLET | Freq: Every day | ORAL | Status: DC
  Administered 2013-01-08 – 2013-01-12 (×5): 25 mg via ORAL
  Filled 2013-01-07 (×7): qty 1

## 2013-01-07 MED ORDER — ACETAMINOPHEN 325 MG PO TABS
650.0000 mg | ORAL_TABLET | Freq: Four times a day (QID) | ORAL | Status: DC | PRN
  Administered 2013-01-07 – 2013-01-10 (×4): 650 mg via ORAL

## 2013-01-07 MED ORDER — MAGNESIUM HYDROXIDE 400 MG/5ML PO SUSP: 30.0000 mL | Freq: Every day | ORAL | Status: DC | PRN

## 2013-01-07 MED ORDER — TRAZODONE HCL 50 MG PO TABS
50.0000 mg | ORAL_TABLET | Freq: Every evening | ORAL | Status: DC | PRN
  Administered 2013-01-08 – 2013-01-11 (×4): 50 mg via ORAL
  Filled 2013-01-07 (×3): qty 1
  Filled 2013-01-07: qty 28
  Filled 2013-01-07 (×2): qty 1

## 2013-01-07 MED ORDER — ALUM & MAG HYDROXIDE-SIMETH 200-200-20 MG/5ML PO SUSP: 30.0000 mL | ORAL | Status: DC | PRN

## 2013-01-07 NOTE — Tx Team (Signed)
Initial Interdisciplinary Treatment Plan  PATIENT STRENGTHS: (choose at least two) Ability for insight Active sense of humor Average or above average intelligence Capable of independent living Communication skills Motivation for treatment/growth Religious Affiliation Special hobby/interest  PATIENT STRESSORS: Financial difficulties Health problems Legal issue Loss of boyfriend about a month ago Medication change or noncompliance   PROBLEM LIST: Problem List/Patient Goals Date to be addressed Date deferred Reason deferred Estimated date of resolution  "Clear up thinking process, get rid of voices and noise in my head." 07 Jan 2013     "Want to stop drinking again." 07 Jan 2013                                                DISCHARGE CRITERIA:  Ability to meet basic life and health needs Adequate post-discharge living arrangements Improved stabilization in mood, thinking, and/or behavior Medical problems require only outpatient monitoring Motivation to continue treatment in a less acute level of care Need for constant or close observation no longer present  PRELIMINARY DISCHARGE PLAN: Outpatient therapy Return to previous living arrangement  PATIENT/FAMIILY INVOLVEMENT: This treatment plan has been presented to and reviewed with the patient, [name], and/or family member.  The patient and family have been given the opportunity to ask questions and make suggestions.  [name] 01/07/2013, 5:43 PM

## 2013-01-07 NOTE — Progress Notes (Signed)
Pt accepted to [facility] 403[facility]1 to Dr. Jannifer Franklin. Pt to transported by [employer] to [facility] under IVC. [facility] to inform nurses station when it is ok to send patient.   Catha Gosselin, LCSW [phone]  ED CSW .01/07/2013 1500pm

## 2013-01-07 NOTE — ED Notes (Signed)
Shuvon NP into see 

## 2013-01-07 NOTE — Progress Notes (Signed)
Patient is hypertensive (BP 176/112, HR 66; taken at 1810). Patient states that she takes hydrochlorothiazide at home but states that she "hasn't taken it in a while." Patient's blood pressure obtained three times and all pressures remain high (above 150/90). Attempted to call Dr. Valora Corporal cell phone (on call MD). There was no response. Writer then paged MD to notify of need for BP medication. Awaiting response. Will continue to monitor patient for safety.

## 2013-01-07 NOTE — Progress Notes (Signed)
[facility] provided patient with a [employer] Orange Card application, highlighting [facility].  °

## 2013-01-07 NOTE — ED Notes (Signed)
[employer] contacted for transport 

## 2013-01-07 NOTE — Progress Notes (Signed)
Patient ID: [name], female   DOB: Sep 13, 1960, 52 y.o.   MRN: [id number] D:  52 year old African American female admitted from [facility] on an involuntary basis.  She states she found her boyfriend dead about a month ago after caring for him for the past two years.  They had been together for about seven years and he was on dialysis for the past two.  States she went into a deep depression and started drinking some.  Also states she has been hearing voices and music in her head, but usually cannot make out what is being said.  States it is like static, but she is not able to make out anything specific.  States she has had voices in the past telling her she is worthless, a bad mother, etc.  States the reason she has been off medications for over a year is that she lost her insurance and has been unable to afford them.  Blood pressure is high on admission, and she states she has not taken HCTZ in several months.  Also states she has had a 50 pound weight loss in the past several months.  States decreased appetite and decreased sleep for the past month.  A:  Completed the admission process.  Oriented patient to the unit, her room, and the unit schedule.  Educated on falls prevention.  Dinner and fluids were brought back to the unit for the patient.   R:  Pleasant and cooperative with the admission process.  Verbalized understanding of all education and was able to repeat back ways to avoid falls.  She was also able to state she will seek out staff if she is having any thoughts of wanting to hurt herself.

## 2013-01-07 NOTE — ED Notes (Signed)
Up to the bathroom 

## 2013-01-07 NOTE — ED Notes (Signed)
Pt ambulatory to [facility] w/ [employer], 1 bag of belongings sent

## 2013-01-07 NOTE — Consult Note (Signed)
Note reviewed and agreed with  

## 2013-01-07 NOTE — ED Notes (Signed)
Up to the desk to use the phone, pt to be transferred to [facility] for admission.

## 2013-01-07 NOTE — Consult Note (Signed)
[facility] Psychiatry Consult   Reason for Consult:  Evaluation for inpatient treatment Referring Physician:  EDP  [name] is an 52 y.o. female.  Assessment: AXIS I:  Psychotic Disorder NOS AXIS II:  Deferred AXIS III:   Past Medical History  Diagnosis Date  . Hypertension   . Mental disorder   . Depression   . Bipolar 1 disorder   . Schizophrenia    AXIS IV:  other psychosocial or environmental problems, problems related to social environment, problems with access to health care services and problems with primary support group AXIS V:  41-50 serious symptoms  Plan:  Recommend psychiatric Inpatient admission when medically cleared.  Subjective:   [name] is a 52 y.o. female   HPI:  Patient states that she has been hearing voice.  "It just a bunch of different voice; a whole lot of people talking and babies crying.  Patient states that she is also having some feelings of paranoia "I just feel like something is going to happen to me; someone is going to hurt me or stab me when I leave the house; sometime I feel like someone is standing behind me and when I turn around no one is there."  Patient states that she has a history of hearing voices and has taken medication before but cant remember what she was taking.  States that she has been off of he medication for 2 years related to coming off of medicaid and unable to afford medication.  Patient also states that she had outpatient services at [facility] which she no longer goes anymore.     HPI Elements:   Location:  [facility] ED. Quality:  Affecting patient mentally and physicially . Severity:  Hearing voices.  Past Psychiatric History: Past Medical History  Diagnosis Date  . Hypertension   . Mental disorder   . Depression   . Bipolar 1 disorder   . Schizophrenia     reports that she has been smoking Cigarettes.  She has been smoking about 3.00 packs per day. She does not have any smokeless tobacco history on  file. She reports that  drinks alcohol. She reports that she uses illicit drugs (Marijuana). No family history on file.         Allergies:   Allergies  Allergen Reactions  . Penicillins     REACTION: swelling of tongue; hives    Past Psychiatric History: Diagnosis:  Bipolar disorder with psychotic features  Hospitalizations:  [facility] 2009  Outpatient Care:  [facility] in past.  Not in last 2 years  Substance Abuse Care:  ETOH and Cocaine  Self-Mutilation:  Denies  Suicidal Attempts:  Overdose in past  Violent Behaviors:  Denies   Objective: Blood pressure 133/100, pulse 74, temperature 97.9 F (36.6 C), temperature source Oral, resp. rate 16, height 5\' 4"  (1.626 m), weight 61.888 kg (136 lb 7 oz), SpO2 100.00%.Body mass index is 23.41 kg/(m^2). Results for orders placed during the hospital encounter of 01/06/13 (from the past 72 hour(s))  ACETAMINOPHEN LEVEL     Status: None   Collection Time    01/06/13  9:20 PM      Result Value Range   Acetaminophen (Tylenol), Serum <15.0  10 - 30 ug/mL   Comment:            THERAPEUTIC CONCENTRATIONS VARY     SIGNIFICANTLY. A RANGE OF 10-30     ug/mL MAY BE AN EFFECTIVE     CONCENTRATION FOR MANY  PATIENTS.     HOWEVER, SOME ARE BEST TREATED     AT CONCENTRATIONS OUTSIDE THIS     RANGE.     ACETAMINOPHEN CONCENTRATIONS     >150 ug/mL AT 4 HOURS AFTER     INGESTION AND >50 ug/mL AT 12     HOURS AFTER INGESTION ARE     OFTEN ASSOCIATED WITH TOXIC     REACTIONS.  CBC     Status: Abnormal   Collection Time    01/06/13  9:20 PM      Result Value Range   WBC 6.4  4.0 - 10.5 K/uL   RBC 4.14  3.87 - 5.11 MIL/uL   Hemoglobin 14.1  12.0 - 15.0 g/dL   HCT 16.1  09.6 - 04.5 %   MCV 97.1  78.0 - 100.0 fL   MCH 34.1 (*) 26.0 - 34.0 pg   MCHC 35.1  30.0 - 36.0 g/dL   RDW 40.9  81.1 - 91.4 %   Platelets 218  150 - 400 K/uL  COMPREHENSIVE METABOLIC PANEL     Status: Abnormal   Collection Time    01/06/13  9:20 PM      Result Value Range    Sodium 138  135 - 145 mEq/L   Potassium 3.6  3.5 - 5.1 mEq/L   Chloride 101  96 - 112 mEq/L   CO2 24  19 - 32 mEq/L   Glucose, Bld 94  70 - 99 mg/dL   BUN 7  6 - 23 mg/dL   Creatinine, Ser 7.82  0.50 - 1.10 mg/dL   Calcium 9.8  8.4 - 95.6 mg/dL   Total Protein 8.1  6.0 - 8.3 g/dL   Albumin 4.1  3.5 - 5.2 g/dL   AST 27  0 - 37 U/L   ALT 20  0 - 35 U/L   Alkaline Phosphatase 64  39 - 117 U/L   Total Bilirubin 0.1 (*) 0.3 - 1.2 mg/dL   GFR calc non Af Amer >90  >90 mL/min   GFR calc Af Amer >90  >90 mL/min   Comment: (NOTE)     The eGFR has been calculated using the CKD EPI equation.     This calculation has not been validated in all clinical situations.     eGFR's persistently <90 mL/min signify possible Chronic Kidney     Disease.  ETHANOL     Status: Abnormal   Collection Time    01/06/13  9:20 PM      Result Value Range   Alcohol, Ethyl (B) 339 (*) 0 - 11 mg/dL   Comment:            LOWEST DETECTABLE LIMIT FOR     SERUM ALCOHOL IS 11 mg/dL     FOR MEDICAL PURPOSES ONLY  SALICYLATE LEVEL     Status: Abnormal   Collection Time    01/06/13  9:20 PM      Result Value Range   Salicylate Lvl <2.0 (*) 2.8 - 20.0 mg/dL  URINE RAPID DRUG SCREEN (HOSP PERFORMED)     Status: None   Collection Time    01/06/13 10:38 PM      Result Value Range   Opiates NONE DETECTED  NONE DETECTED   Cocaine NONE DETECTED  NONE DETECTED   Benzodiazepines NONE DETECTED  NONE DETECTED   Amphetamines NONE DETECTED  NONE DETECTED   Tetrahydrocannabinol NONE DETECTED  NONE DETECTED   Barbiturates NONE DETECTED  NONE DETECTED  Comment:            DRUG SCREEN FOR MEDICAL PURPOSES     ONLY.  IF CONFIRMATION IS NEEDED     FOR ANY PURPOSE, NOTIFY LAB     WITHIN 5 DAYS.                LOWEST DETECTABLE LIMITS     FOR URINE DRUG SCREEN     Drug Class       Cutoff (ng/mL)     Amphetamine      1000     Barbiturate      200     Benzodiazepine   200     Tricyclics       300     Opiates          300      Cocaine          300     THC              50     Current Facility-Administered Medications  Medication Dose Route Frequency Provider Last Rate Last Dose  . acetaminophen (TYLENOL) tablet 650 mg  650 mg Oral Q4H PRN Arman Filter, NP      . ibuprofen (ADVIL,MOTRIN) tablet 600 mg  600 mg Oral Q8H PRN Arman Filter, NP      . LORazepam (ATIVAN) tablet 1 mg  1 mg Oral Q8H PRN Arman Filter, NP      . nicotine (NICODERM CQ - dosed in mg/24 hours) patch 21 mg  21 mg Transdermal Daily Arman Filter, NP   21 mg at 01/07/13 1034  . zolpidem (AMBIEN) tablet 5 mg  5 mg Oral QHS PRN Arman Filter, NP       Current Outpatient Prescriptions  Medication Sig Dispense Refill  . Aspirin-Salicylamide-Caffeine (BC HEADACHE POWDER PO) Take 1 packet by mouth every 4 (four) hours as needed (for pain).        Psychiatric Specialty Exam:     Blood pressure 133/100, pulse 74, temperature 97.9 F (36.6 C), temperature source Oral, resp. rate 16, height 5\' 4"  (1.626 m), weight 61.888 kg (136 lb 7 oz), SpO2 100.00%.Body mass index is 23.41 kg/(m^2).  General Appearance: Disheveled  Eye Solicitor::  Fair  Speech:  Clear and Coherent and Normal Rate  Volume:  Normal  Mood:  Anxious, Depressed and Hopeless  Affect:  Depressed and Tearful  Thought Process:  Circumstantial and Goal Directed  Orientation:  Full (Time, Place, and Person)  Thought Content:  Hallucinations: Auditory, Paranoid Ideation and Rumination  Suicidal Thoughts:  Yes.  without intent/plan  Homicidal Thoughts:  No  Memory:  Immediate;   Fair Recent;   Fair Remote;   Fair  Judgement:  Impaired  Insight:  Fair  Psychomotor Activity:  Normal  Concentration:  Fair  Recall:  Fair  Akathisia:  No  Handed:  Right  AIMS (if indicated):     Assets:  Desire for Improvement Housing  Sleep:      Treatment Plan Summary: Daily contact with patient to assess and evaluate symptoms and progress in treatment Medication management  Disposition:   Inpatient treatment. Patient accepted to [facility]; if no beds available look for placement elsewhere.  1. Admit for crisis management and stabilization.  2. Review and initiate  medications pertinent to patient illness and treatment.  3. Medication management to reduce current symptoms to base line and improve the  patient's overall level of functioning.   Rankin, Shuvon, FNP-BC 01/07/2013 10:40 AM

## 2013-01-07 NOTE — ED Notes (Signed)
Patient calming down and requesting food to eat. Food and drink given to patient. Respirations equal and unlabored. Patient talks about she has been hurt by people and while talking she starts to cry. Encouragement and support given to patient.

## 2013-01-08 ENCOUNTER — Encounter (HOSPITAL_COMMUNITY): Payer: Self-pay | Admitting: Psychiatry

## 2013-01-08 DIAGNOSIS — F259 Schizoaffective disorder, unspecified: Principal | ICD-10-CM

## 2013-01-08 MED ORDER — LISINOPRIL 5 MG PO TABS
5.0000 mg | ORAL_TABLET | Freq: Every day | ORAL | Status: DC
  Administered 2013-01-08 – 2013-01-12 (×5): 5 mg via ORAL
  Filled 2013-01-08 (×7): qty 0.5

## 2013-01-08 MED ORDER — BENZTROPINE MESYLATE 0.5 MG PO TABS
0.5000 mg | ORAL_TABLET | Freq: Every day | ORAL | Status: DC
  Administered 2013-01-08 – 2013-01-11 (×4): 0.5 mg via ORAL
  Filled 2013-01-08 (×6): qty 1

## 2013-01-08 MED ORDER — FLUOXETINE HCL 20 MG PO CAPS
20.0000 mg | ORAL_CAPSULE | Freq: Every day | ORAL | Status: DC
  Administered 2013-01-08 – 2013-01-11 (×4): 20 mg via ORAL
  Filled 2013-01-08 (×5): qty 1

## 2013-01-08 MED ORDER — ENSURE COMPLETE PO LIQD
237.0000 mL | ORAL | Status: DC
  Administered 2013-01-09 – 2013-01-11 (×3): 237 mL via ORAL

## 2013-01-08 MED ORDER — FLUPHENAZINE HCL 5 MG PO TABS
5.0000 mg | ORAL_TABLET | Freq: Every day | ORAL | Status: DC
  Administered 2013-01-08 – 2013-01-11 (×4): 5 mg via ORAL
  Filled 2013-01-08 (×6): qty 1

## 2013-01-08 NOTE — Progress Notes (Addendum)
D: Pt is a pleasant 52 yr old female that presents calm and cooperative this evening. Pt was observed interacting appropriately within the milieu. Pt attended group this evening. Pt reports feeling better today. Pt was hypertensive this evening. Pt's blood pressure was 162/109 sitting. Labetalol 100 mg was administered. Her follow-up BP was 156/90 lying.  A: Writer administered scheduled and prn medications to pt. Continued support and availability as needed was extended to this pt. Staff continue to monitor pt with q38min checks.  R: No adverse drug reactions noted. Pt receptive to treatment. Pt remains safe at this time.
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
## 2013-01-08 NOTE — Progress Notes (Signed)
Adult Psychoeducational Group Note  Date:  01/08/2013 Time:  1:31 AM  Group Topic/Focus:  Wrap-Up Group:   The focus of this group is to help patients review their daily goal of treatment and discuss progress on daily workbooks.  Participation Level:  Active  Participation Quality:  Appropriate  Affect:  Appropriate  Cognitive:  Appropriate  Insight: Appropriate  Engagement in Group:  Engaged  Modes of Intervention:  Discussion  Additional Comments:  Pt attended wrap-up group this evening. Pt was pleasant and participated with peers. Pt described her day as being "pretty good". Pt stated that she would like to focus on getting better and staying on her medication.   Aydenn Gervin A 01/08/2013, 1:31 AM

## 2013-01-08 NOTE — Progress Notes (Signed)
Patient ID: [name], female   DOB: July 30, 1960, 52 y.o.   MRN: [id number] D: Pt is awake and active on the unit this PM. Pt denies SI/HI and A/V hallucinations. Pt mood is anxious and her affect is depressed. Pt is isolative and spending a lot of time in her room and c/o a HA. Pt did not attend wrap up group.   A: Encouraged pt to discuss feelings with staff and administered medication per MD orders. Writer also encouraged pt to participate in groups.  R: Pt did not attend group but is tolerating medications well. Writer will continue to monitor. 15 minute checks are ongoing for safety.
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
## 2013-01-08 NOTE — Progress Notes (Signed)
NUTRITION ASSESSMENT  Pt identified as at risk on the Malnutrition Screen Tool  INTERVENTION: 1. Educated patient on the importance of nutrition and encouraged intake of food and beverages. 2. Discussed weight goals. 3. Supplements: Ensure Complete po once daily, each supplement provides 350 kcal and 13 grams of protein. com  NUTRITION DIAGNOSIS: Unintentional weight loss related to sub-optimal intake as evidenced by pt report.   Goal: Pt to meet >/= 90% of their estimated nutrition needs.  Monitor:  PO intake  Assessment:  Patient admitted with psychotic d/o.  Hx of schizophrenia, bipolar disorder, HTN.  Patient reports a good intake currently but very poor prior to admit.  States that she would cook for others but not eat herself secondary to poor appetite.  Might only eat an egg and 1 can ensure daily.  Chart indicates <3% weight loss in the past 1 1/2 years.  UBW 172 lbs but not recently.  52 y.o. female  Height: Ht Readings from Last 1 Encounters:  01/06/13 5\' 4"  (1.626 m)    Weight: Wt Readings from Last 1 Encounters:  01/06/13 136 lb 7 oz (61.888 kg)    Weight Hx: Wt Readings from Last 10 Encounters:  01/06/13 136 lb 7 oz (61.888 kg)  05/21/11 140 lb (63.504 kg)    BMI:  23.6 Pt meets criteria for wnl based on current BMI.  Estimated Nutritional Needs: Kcal: 25-30 kcal/kg Protein: > 1 gram protein/kg Fluid: 1 ml/kcal  Diet Order: General Pt is also offered choice of unit snacks mid-morning and mid-afternoon.  Pt is eating as desired.   Lab results and medications reviewed.   Oran Rein, RD, LDN Clinical Inpatient Dietitian Pager:  [phone] Weekend and after hours pager:  [phone]
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
## 2013-01-08 NOTE — Progress Notes (Signed)
D: Patient denies SI/HI and visual hallucinations. The patient is positive for auditory hallucinations and reports hearing voices that command her to hurt herself. The patient states that she "would not act on these feelings." The patient has a depressed mood and affect. The patient states that she is depressed about the death of a friend a month ago. The patient is tearful at times but still engages in the milieu and participates in groups. The patient reports that she was his caregiver and that they "were close." The patient's blood pressure remains elevated, BP=155/94 HR=75.  A: Patient given emotional support from RN. Patient encouraged to come to staff with concerns and/or questions. Patient's medication routine continued. Patient's orders and plan of care reviewed. Patient received HCTZ medication this morning. MD notified of patient's blood pressure.  R: Patient remains cooperative. Will continue to monitor patient q15 minutes for safety.

## 2013-01-08 NOTE — Tx Team (Signed)
  Interdisciplinary Treatment Plan Update   Date Reviewed:  01/08/2013  Time Reviewed:  8:08 AM  Progress in Treatment:   Attending groups: Yes Participating in groups: Yes Taking medication as prescribed: Yes  Tolerating medication: Yes Family/Significant other contact made: Yes  Patient understands diagnosis: Yes  As evidenced by asking for help with psychosis and depression Discussing patient identified problems/goals with staff: Yes Medical problems stabilized or resolved: Yes Denies suicidal/homicidal ideation: No but contracts for safety Patient has not harmed self or others: Yes  For review of initial/current patient goals, please see plan of care.  Estimated Length of Stay:  4-5 days  Reason for Continuation of Hospitalization: Depression Hallucinations Medication stabilization Suicidal ideation  New Problems/Goals identified:  N/A  Discharge Plan or Barriers:   return home, follow up outpt  Additional Comments:  52 year old African American female admitted from [facility] on an involuntary basis. She states she found her boyfriend dead about a month ago after caring for him for the past two years. They had been together for about seven years and he was on dialysis for the past two. States she went into a deep depression and started drinking some. Also states she has been hearing voices and music in her head, but usually cannot make out what is being said. States it is like static, but she is not able to make out anything specific. States she has had voices in the past telling her she is worthless, a bad mother, etc. States the reason she has been off medications for over a year is that she lost her insurance and has been unable to afford them.   Attendees:  Signature: Thedore Mins, MD 01/08/2013 8:08 AM   Signature: Richelle Ito, LCSW 01/08/2013 8:08 AM  Signature: Fransisca Kaufmann, NP 01/08/2013 8:08 AM  Signature: Malva Limes, RN 01/08/2013 8:08 AM  Signature: Nestor Ramp, RN  01/08/2013 8:08 AM  Signature:  01/08/2013 8:08 AM  Signature:   01/08/2013 8:08 AM  Signature:    Signature:    Signature:    Signature:    Signature:    Signature:      Scribe for Treatment Team:   Richelle Ito, LCSW  01/08/2013 8:08 AM

## 2013-01-08 NOTE — Progress Notes (Signed)
Adult Psychoeducational Group Note  Date:  01/08/2013 Time:  11:00AM Group Topic/Focus:  Relapse Prevention Planning:   The focus of this group is to define relapse and discuss the need for planning to combat relapse.  Participation Level:  Active  Participation Quality:  Appropriate and Attentive  Affect:  Appropriate  Cognitive:  Alert and Appropriate  Insight: Appropriate  Engagement in Group:  Engaged  Modes of Intervention:  Discussion  Additional Comments:  Pt. Was attentive and appropriate during today's group discussion. Pt was able to discuss relapse prevention. Pt shared that she is learning how to take better care of herself and how she takes on extra task daily for her family and friends. Pt. Shared how sometimes she is so busy that she forget to eat during the day. Pt stated that relapse for her is not having self care.   Bing Plume D 01/08/2013, 1:00 PM

## 2013-01-08 NOTE — [facility] Group Notes (Signed)
[facility] LCSW Group Therapy  01/08/2013  1:05 PM  Type of Therapy:  Group therapy  Participation Level:  Active  Participation Quality:  Attentive  Affect:  Flat  Cognitive:  Oriented  Insight:  Limited  Engagement in Therapy:  Limited  Modes of Intervention:  Discussion, Socialization  Summary of Progress/Problems:  Chaplain was here to lead a group on themes of hope and courage. Stated that her children give her courage because of how they persevere and achieve.  Other pointed out that she got that from her.  It was difficult for her to hear that. Ida Rogue 01/08/2013 1:38 PM

## 2013-01-08 NOTE — Progress Notes (Signed)
Psychoeducational Group Note  Date:  01/08/2013 Time:  2000  Group Topic/Focus:  Wrap-Up Group:   The focus of this group is to help patients review their daily goal of treatment and discuss progress on daily workbooks.  Participation Level: Did Not Attend  Participation Quality:  Not Applicable  Affect:  Not Applicable  Cognitive:  Not Applicable  Insight:  Not Applicable  Engagement in Group: Not Applicable  Additional Comments:  The patient did not attend group since she was sleeping.   Hazle Coca S 01/08/2013, 11:48 PM

## 2013-01-08 NOTE — [facility] Group Notes (Signed)
[facility] LCSW Aftercare Discharge Planning Group Note   01/08/2013 8:08 AM  Participation Quality:  Engaged  Mood/Affect:  Depressed  Depression Rating:  9  Anxiety Rating:  5  Thoughts of Suicide:  Yes Will you contract for safety?   Yes  Current AVH:  Yes  Plan for Discharge/Comments:  Nasya May presents as tearful, hopeless, helpless.  States she quit taking meds a year ago because she lost her MCD when her son turned 71.  Then her partner of 7 years died of complications due to renal failure last month.  She endorses depression, SI and psychosis.  Plans to return home with her sons at d/c, follow up outpt.  Transportation Means: son  Supports: son  Ida Rogue

## 2013-01-09 DIAGNOSIS — F259 Schizoaffective disorder, unspecified: Principal | ICD-10-CM

## 2013-01-09 NOTE — Progress Notes (Signed)
Pt s mood is very anxious this am and states she is hearing music that is not playing. Presently she is in the dayroom with the other pts . She was given 650mg  of tylenol this am for a headache. Pt states her depression is a 6/10 this am and hopelessness a 7/10. She does contract for safety.Pt upon discharge would like to take care of herself and stay on her medications.

## 2013-01-09 NOTE — Progress Notes (Signed)
Patient ID: [name], female   DOB: 08/21/1960, 52 y.o.   MRN: [id number]   Reports poor sleep. Still has AVH. Upset today.    Psychiatric Specialty Exam:  Physical Exam  Psychiatric: Her speech is normal. Her mood appears anxious. She is actively hallucinating. Thought content is paranoid and delusional. Cognition and memory are normal. She expresses impulsivity. She exhibits a depressed mood. She expresses suicidal ideation.    Review of Systems  Constitutional: Negative.  HENT: Negative.  Eyes: Negative.  Respiratory: Negative.  Cardiovascular: Negative.  Gastrointestinal: Negative.  Genitourinary: Negative.  Musculoskeletal: Negative.  Skin: Negative.  Neurological: Negative.  Endo/Heme/Allergies: Negative.  Psychiatric/Behavioral: Positive for depression, suicidal ideas and hallucinations. The patient is nervous/anxious and has insomnia.    Blood pressure 155/94, pulse 75, temperature 99.5 F (37.5 C), temperature source Oral, resp. rate 20, last menstrual period 10/25/2012, SpO2 100.00%.There is no weight on file to calculate BMI.   General Appearance: Disheveled   Eye Contact:: Minimal   Speech: Clear and Coherent   Volume: Decreased   Mood: Anxious  Affect: Constricted and Tearful   Thought Process: Goal Directed and Linear   Orientation: Full (Time, Place, and Person)   Thought Content: Delusions, Hallucinations: Auditory and Paranoid Ideation   Suicidal Thoughts: no   Homicidal Thoughts: No   Memory: Immediate; Fair  Recent; Fair  Remote; Fair   Judgement: Impaired   Insight: Shallow   Psychomotor Activity: Decreased   Concentration: Fair   Recall: Fair   Akathisia: No   Handed: Right   AIMS (if indicated):   Assets: Communication Skills  Desire for Improvement  Social Support               Psychological Evaluations:  Assessment:  DSM5:  Schizophrenia Disorders: Schizophrenia (295.7)  Obsessive-Compulsive Disorders: Denies   Trauma-Stressor Disorders: denies  Substance/Addictive Disorders: Alcohol Intoxication with Use Disorder - Mild ((F10.129) and Cannabis Use Disorder - Mild (305.20)  Depressive Disorders: Major Depressive Disorder - Severe (296.23)  AXIS I: Schizoaffective Disorder  AXIS II: Deferred  AXIS III:  Past Medical History   Diagnosis  Date   .  Hypertension    .  Mental disorder    .  Depression    .  Bipolar 1 disorder    .  Schizophrenia     AXIS IV: economic problems, other psychosocial or environmental problems, problems related to social environment and bereavement  AXIS V: 11-20 some danger of hurting self or others possible OR occasionally fails to maintain minimal personal hygiene OR gross impairment in communication  Treatment Plan/Recommendations: 1. Admit for crisis management and stabilization.  2. Medication management to reduce current symptoms to base line and improve the patient's overall level of functioning    Treatment Plan Summary:  Daily contact with patient to assess and evaluate symptoms and progress in treatment  Medication management

## 2013-01-09 NOTE — B[facility] Group Notes (Signed)
[facility] Group Notes:  (Clinical Social Work)  01/09/2013  11:15-11:45AM  Summary of Progress/Problems:   The main focus of today's process group was for the patient to identify ways in which they have in the past sabotaged their own recovery and reasons they may have done this/what they received from doing it.  We then worked to identify a specific plan to avoid doing this when discharged from the hospital for this admission.  The patient expressed that she hangs out with the wrong people and ends up drinking alcohol.  She reported that she also uses marijuana, but states that it is for her appetite.  She has tried telling these "wrong" people to stay away from her before, has even threatened to take out a restraining order and states that they have laughed at her.  This time when she is discharged from the hospital, she plans to take out a restraining order to keep them from being around and influencing her to drink.  She reported that when she drinks, she hears voices and has suicidal thoughts.  She also has been having an increase in these symptoms since last month when her partner of 7 years had a stroke and died.  Type of Therapy:  Group Therapy - Process  Participation Level:  Active  Participation Quality:  Attentive and Sharing  Affect:  Anxious and Blunted  Cognitive:  Appropriate and Oriented  Insight:  Engaged  Engagement in Therapy:  Engaged  Modes of Intervention:  Clarification, Education, Exploration, Discussion  Sheri Mantle, LCSW 01/09/2013, 1:12 PM

## 2013-01-10 NOTE — Progress Notes (Signed)
Patient ID: [name], female   DOB: 12-Jun-1960, 52 y.o.   MRN: [id number]   S;   Reports poor sleep. Still disorganized. Pleasant and working on puzzles.    Psychiatric Specialty Exam:  Physical Exam  Psychiatric: Her speech is normal. Her mood appears anxious. She is actively hallucinating. Thought content is paranoid and delusional. Cognition and memory are normal. She expresses impulsivity. She exhibits a depressed mood. She expresses suicidal ideation.    Review of Systems  Constitutional: Negative.  HENT: Negative.  Eyes: Negative.  Respiratory: Negative.  Cardiovascular: Negative.  Gastrointestinal: Negative.  Genitourinary: Negative.  Musculoskeletal: Negative.  Skin: Negative.  Neurological: Negative.  Endo/Heme/Allergies: Negative.  Psychiatric/Behavioral: Positive for depression, suicidal ideas and hallucinations. The patient is nervous/anxious and has insomnia.    Blood pressure 155/94, pulse 75, temperature 99.5 F (37.5 C), temperature source Oral, resp. rate 20, last menstrual period 10/25/2012, SpO2 100.00%.There is no weight on file to calculate BMI.   General Appearance: Disheveled   Eye Contact:: Minimal   Speech: Clear and Coherent   Volume: Decreased   Mood: Anxious  Affect: Constricted and Tearful   Thought Process: Goal Directed and Linear   Orientation: Full (Time, Place, and Person)   Thought Content: Delusions, Hallucinations: Auditory and Paranoid Ideation   Suicidal Thoughts: no   Homicidal Thoughts: No   Memory: Immediate; Fair  Recent; Fair  Remote; Fair   Judgement: Impaired   Insight: Shallow   Psychomotor Activity: Decreased   Concentration: Fair   Recall: Fair   Akathisia: No   Handed: Right   AIMS (if indicated):   Assets: Communication Skills  Desire for Improvement  Social Support               Psychological Evaluations:  Assessment:  DSM5:  Schizophrenia Disorders: Schizophrenia (295.7)   Obsessive-Compulsive Disorders: Denies  Trauma-Stressor Disorders: denies  Substance/Addictive Disorders: Alcohol Intoxication with Use Disorder - Mild ((F10.129) and Cannabis Use Disorder - Mild (305.20)  Depressive Disorders: Major Depressive Disorder - Severe (296.23)  AXIS I: Schizoaffective Disorder  AXIS II: Deferred  AXIS III:  Past Medical History   Diagnosis  Date   .  Hypertension    .  Mental disorder    .  Depression    .  Bipolar 1 disorder    .  Schizophrenia     AXIS IV: economic problems, other psychosocial or environmental problems, problems related to social environment and bereavement  AXIS V: 11-20 some danger of hurting self or others possible OR occasionally fails to maintain minimal personal hygiene OR gross impairment in communication  Treatment Plan/Recommendations: 1. Admit for crisis management and stabilization.  2. Medication management to reduce current symptoms to base line and improve the patient's overall level of functioning    Treatment Plan Summary:  Daily contact with patient to assess and evaluate symptoms and progress in treatment  Continue current meds

## 2013-01-10 NOTE — Progress Notes (Signed)
[facility] Group Notes:  (Nursing/MHT/Case Management/Adjunct)  Date:  01/09/2013 Time:  2000  Type of Therapy:  Psychoeducational Skills  Participation Level:  Minimal  Participation Quality:  Resistant  Affect:  Blunted  Cognitive:  Appropriate  Insight:  Improving  Engagement in Group:  Lacking  Modes of Intervention:  Education  Summary of Progress/Problems: The patient verbalized in group that she had a pretty good day. She attributed her positive to a number of factors. To begin with, she stated that she laughed a great deal today. Secondly, she acknowledged that attending groups helped a great deal. Finally, she had a good talk with her son over the telephone. Her goal for tomorrow is to eat more and to continue attending the groups.   Rachyl Wuebker S 01/10/2013, 12:07 AM

## 2013-01-10 NOTE — [facility] Group Notes (Signed)
[facility] Group Notes:  (Clinical Social Work)  01/10/2013   11:15am-12:00pm  Summary of Progress/Problems:  The main focus of today's process group was to listen to a variety of genres of music and to identify that different types of music provoke different responses.  The patient then was able to identify personally what was soothing for them, as well as energizing.  Handouts were used to record feelings evoked, as well as how patient can personally use this knowledge in sleep habits, with depression, and with other symptoms.  The patient expressed understanding of concepts, as well as knowledge of how each type of music affected them and how this can be used when they are at home as a tool in their recovery.  She was interactive throughout group, enjoyed many kinds of music and expressed intention to use music as a tool at home.  Type of Therapy:  Music Therapy   Participation Level:  Active  Participation Quality:  Attentive and Sharing  Affect:  Appropriate  Cognitive:  Oriented  Insight:  Engaged  Engagement in Therapy:  Engaged  Modes of Intervention:   Activity, Exploration  Ambrose Mantle, LCSW 01/10/2013, 12:43 PM

## 2013-01-10 NOTE — Progress Notes (Signed)
[facility] Group Notes:  (Nursing/MHT/Case Management/Adjunct)  Date:  01/10/2013  Time:  2000  Type of Therapy:  Psychoeducational Skills  Participation Level:  Minimal  Participation Quality:  Attentive  Affect:  Blunted  Cognitive:  Appropriate  Insight:  Improving  Engagement in Group:  Limited  Modes of Intervention:  Education  Summary of Progress/Problems: The patient shared with the group that she attended all of the groups today. Her goal for tomorrow is to try to get more rest.   Santanna Whitford S 01/10/2013, 9:04 PM

## 2013-01-10 NOTE — Progress Notes (Addendum)
D   Pt is pleasant on approach and interacts well with others   She attends and participates in groups   She was dancing in group this morning   She had a nursing student and was interacting well with the student as she was being interviewed A   Verbal support offered   Continue to evaluate the effectiveness of her medications and observe Q 15 minute checks R   Pt safe at present Student nurse was discussing discharge plans with patient and pt expressed concern for getting her medications because she had stopped her medications and got sick and came in the hospital   She understands the importance of medication compliance  Student nurse was able to provide solutions to pt regarding if pharmacy did not have her medication  And wrote them down on a sheet of paper for her

## 2013-01-10 NOTE — Progress Notes (Signed)
Pt up and visible in milieu tonight. Affect is anxious with congruent mood. Continues to endorse AH but denies they are command in nature. Supported, encouraged. Medicated per orders without difficulty. Denies SI/HI/VH and remains safe. Lawrence Marseilles

## 2013-01-11 MED ORDER — FLUOXETINE HCL 20 MG PO CAPS
30.0000 mg | ORAL_CAPSULE | Freq: Every day | ORAL | Status: DC
  Administered 2013-01-12: 30 mg via ORAL
  Filled 2013-01-11 (×3): qty 1

## 2013-01-11 NOTE — Progress Notes (Signed)
The focus of this group is to help patients review their daily goal of treatment and discuss progress on daily workbooks. Pt attended the evening group and responded to all discussion prompts from the Writer. Pt reported having a good day on the unit to due her "work out," which meant walking up and down the hallway for exercise. Pt also reported being happy about her pending discharge tomorrow and felt that everything was in order regarding her aftercare plan. 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 Pt's affect was appropriate and she volunteered several encouraging comments to her peers.

## 2013-01-11 NOTE — Progress Notes (Addendum)
Pt was found lying in her bed and is very pleasant/ She stated she had visiitors today and had a very good day. Pt did attend group this pm and laughed during most of the group. She is pleasant and contracts for safety,No SI or HI. Pt is excited about being discharged tomorrow.

## 2013-01-11 NOTE — Progress Notes (Signed)
Pt pleasant and appropriate on unit tonight. Affect is anxious with congruent mood. She is able to deny AVH tonight. Attended group and was participative. Pt's only complaint is poor sleep. Medicated per orders and given trazadone prn. Instructed pt to return for follow up dose if not asleep in 1 hour however pt did not. When this RN went to check on and offer follow up trazadone, pt was asleep. Asked MHT to alert this writer if patient awakens. Pt denies SI/HI and remains safe. Lawrence Marseilles

## 2013-01-11 NOTE — Progress Notes (Signed)
Patient ID: [name], female   DOB: 01-Oct-1960, 52 y.o.   MRN: [id number] D: Patient has been pleasant and cooperative.  She presents with depressed mood and sad affect.  Patient rates her depression as a 5.  Her plan is to continue her medication when she is discharged.  Patient denies any SI/HI/AVH. A:  Continue to monitor medication management and MD orders.  Safety checks completed every 15 minutes per protocol. R: Patient's behavior has been appropriate.

## 2013-01-11 NOTE — Progress Notes (Addendum)
[facility] MD Progress Note  01/11/2013 2:35 PM [name]  MRN:  [id number] Subjective:   Patient states "I am still feeling depressed. I was off my medications too long because I could not afford them. I would rate my depression at five today. The voices are decreasing in intensity. I would say they are not all the time now but still there. I just want to make sure I can stay on my medications after I leave here because I do so much better."   Objective:  Patient remains depressed, hopeless, and psychotic. She presents with depressed mood. Patient is starting to interact more on the unit but needs further adjustments to her medications to improve stability of mood.   Diagnosis:   Axis I: Schizoaffective Disorder Axis II: Deferred Axis III:  Past Medical History  Diagnosis Date  . Hypertension   . Mental disorder   . Depression   . Bipolar 1 disorder   . Schizophrenia    Axis IV: economic problems, other psychosocial or environmental problems, problems related to social environment and and bereavement Axis V: 51-60 moderate symptoms  ADL's:  Intact  Sleep: Good  Appetite:  Good  Suicidal Ideation:  Denies Homicidal Ideation:  Denies AEB (as evidenced by):  Psychiatric Specialty Exam: Review of Systems  Constitutional: Negative.  Negative for fever, chills, weight loss and malaise/fatigue.  HENT: Negative.  Negative for hearing loss, ear pain, nosebleeds, neck pain, tinnitus and ear discharge.   Eyes: Negative.  Negative for blurred vision, double vision, photophobia, pain and discharge.  Respiratory: Negative.  Negative for cough, hemoptysis, sputum production and shortness of breath.   Cardiovascular: Negative.  Negative for chest pain, palpitations, orthopnea, claudication and leg swelling.  Gastrointestinal: Negative.  Negative for heartburn, nausea, vomiting, abdominal pain, diarrhea and constipation.  Genitourinary: Negative.  Negative for dysuria, urgency, frequency  and hematuria.  Musculoskeletal: Negative.  Negative for myalgias, back pain and joint pain.  Skin: Negative.  Negative for itching and rash.  Neurological: Negative.  Negative for dizziness, tingling, tremors, sensory change, speech change, focal weakness and headaches.  Endo/Heme/Allergies: Negative.  Negative for environmental allergies. Does not bruise/bleed easily.  Psychiatric/Behavioral: Positive for depression and hallucinations. Negative for suicidal ideas, memory loss and substance abuse. The patient does not have insomnia.     Blood pressure 116/83, pulse 78, temperature 97.2 F (36.2 C), temperature source Oral, resp. rate 20, last menstrual period 10/25/2012, SpO2 100.00%.There is no weight on file to calculate BMI.  General Appearance: Casual  Eye Contact::  Good  Speech:  Clear and Coherent and Slow  Volume:  Normal  Mood:  Depressed and Dysphoric  Affect:  Flat  Thought Process:  Disorganized  Orientation:  Full (Time, Place, and Person)  Thought Content:  Rumination  Suicidal Thoughts:  No  Homicidal Thoughts:  No  Memory:  Immediate;   Fair Recent;   Good Remote;   Fair  Judgement:  Fair  Insight:  Lacking  Psychomotor Activity:  Normal  Concentration:  Fair  Recall:  Fair  Akathisia:  No  Handed:  Right  AIMS (if indicated):     Assets:  Communication Skills Desire for Improvement Leisure Time Resilience  Sleep:  Number of Hours: 6.25   Current Medications: Current Facility-Administered Medications  Medication Dose Route Frequency Provider Last Rate Last Dose  . acetaminophen (TYLENOL) tablet 650 mg  650 mg Oral Q6H PRN Shuvon Rankin, NP   650 mg at 01/10/13 1538  . alum & mag  hydroxide-simeth (MAALOX/MYLANTA) 200-200-20 MG/5ML suspension 30 mL  30 mL Oral Q4H PRN Shuvon Rankin, NP      . benztropine (COGENTIN) tablet 0.5 mg  0.5 mg Oral QHS Mojeed Akintayo   0.5 mg at 01/10/13 2123  . feeding supplement (ENSURE COMPLETE) liquid 237 mL  237 mL Oral Q24H  Jeoffrey Massed, RD   237 mL at 01/10/13 1600  . FLUoxetine (PROZAC) capsule 20 mg  20 mg Oral Daily Mojeed Akintayo   20 mg at 01/11/13 0810  . fluPHENAZine (PROLIXIN) tablet 5 mg  5 mg Oral QHS Mojeed Akintayo   5 mg at 01/10/13 2123  . hydrochlorothiazide (HYDRODIURIL) tablet 25 mg  25 mg Oral Daily Mojeed Akintayo   25 mg at 01/11/13 0810  . lisinopril (PRINIVIL,ZESTRIL) tablet 5 mg  5 mg Oral Daily Fransisca Kaufmann, NP   5 mg at 01/11/13 0810  . LORazepam (ATIVAN) tablet 1 mg  1 mg Oral Q8H PRN Shuvon Rankin, NP      . magnesium hydroxide (MILK OF MAGNESIA) suspension 30 mL  30 mL Oral Daily PRN Shuvon Rankin, NP      . nicotine (NICODERM CQ - dosed in mg/24 hours) patch 21 mg  21 mg Transdermal Daily Shuvon Rankin, NP   21 mg at 01/11/13 0808  . traZODone (DESYREL) tablet 50 mg  50 mg Oral QHS PRN,MR X 1 Mojeed Akintayo   50 mg at 01/10/13 2123    Lab Results: No results found for this or any previous visit (from the past 48 hour(s)).  Physical Findings: AIMS: Facial and Oral Movements Muscles of Facial Expression: None, normal Lips and Perioral Area: None, normal Jaw: None, normal Tongue: None, normal,Extremity Movements Upper (arms, wrists, hands, fingers): None, normal Lower (legs, knees, ankles, toes): None, normal, Trunk Movements Neck, shoulders, hips: None, normal, Overall Severity Severity of abnormal movements (highest score from questions above): None, normal Incapacitation due to abnormal movements: None, normal Patient's awareness of abnormal movements (rate only patient's report): No Awareness, Dental Status Current problems with teeth and/or dentures?: Yes Does patient usually wear dentures?: No  CIWA:    COWS:     Treatment Plan Summary: Daily contact with patient to assess and evaluate symptoms and progress in treatment Medication management  Plan: Continue crisis management and stabilization.  Medication management: Reviewed with patient who stated no untoward  effects. Increase Prozac to 30 mg for continued depressive symptoms. Continue Prolixin 5 mg at hs for psychosis.  Encouraged patient to attend groups and participate in group counseling sessions and activities.  Discharge plan in progress.  Continue current treatment plan.  Address health issues: Vitals reviewed and stable. Started on Lisinopril 5 mg daily which has stabilized patient's blood pressure.   Medical Decision Making Problem Points:  Established problem, stable/improving (1) and Review of psycho-social stressors (1) Data Points:  Review of medication regiment & side effects (2)  I certify that inpatient services furnished can reasonably be expected to improve the patient's condition.   Raniah Karan NP-C 01/11/2013, 2:35 PM

## 2013-01-11 NOTE — Progress Notes (Deleted)
Patient ID: [name], female   DOB: Jun 20, 1960, 52 y.o.   MRN: [id number] D: Patient has been appropriate today with no aggressive behavior.  She remains suspicious and paranoid.  She is religiously preoccupied writing a long note to staff regarding her prayers and faith.  On her self inventory she listed herself as "a child of God."  She has been cooperative today.  She remains with disorganized thought processes.  She denies any SI/HI/AVH. A:  Continue to monitor medication management and MD orders.  Safety checks completed every 15 minutes per protocol.  R: Patient is receptive to staff today and interacting well.

## 2013-01-12 DIAGNOSIS — F1010 Alcohol abuse, uncomplicated: Secondary | ICD-10-CM

## 2013-01-12 MED ORDER — FLUOXETINE HCL 10 MG PO CAPS
30.0000 mg | ORAL_CAPSULE | Freq: Every day | ORAL | Status: DC
  Filled 2013-01-12: qty 42

## 2013-01-12 MED ORDER — HYDROCHLOROTHIAZIDE 25 MG PO TABS
25.0000 mg | ORAL_TABLET | Freq: Every day | ORAL | Status: DC
  Filled 2013-01-12: qty 14

## 2013-01-12 MED ORDER — BENZTROPINE MESYLATE 0.5 MG PO TABS: 0.5000 mg | ORAL_TABLET | Freq: Every day | ORAL | Status: DC

## 2013-01-12 MED ORDER — FLUPHENAZINE HCL 5 MG PO TABS: 5.0000 mg | ORAL_TABLET | Freq: Every day | ORAL | Status: DC

## 2013-01-12 MED ORDER — FLUOXETINE HCL 10 MG PO CAPS: 30.0000 mg | ORAL_CAPSULE | Freq: Every day | ORAL | Status: DC

## 2013-01-12 MED ORDER — LISINOPRIL 5 MG PO TABS: 5.0000 mg | ORAL_TABLET | Freq: Every day | ORAL | Status: DC

## 2013-01-12 MED ORDER — TRAZODONE HCL 50 MG PO TABS: 50.0000 mg | ORAL_TABLET | Freq: Every evening | ORAL | Status: DC | PRN

## 2013-01-12 MED ORDER — HYDROCHLOROTHIAZIDE 25 MG PO TABS: 25.0000 mg | ORAL_TABLET | Freq: Every day | ORAL | Status: DC

## 2013-01-12 NOTE — Progress Notes (Signed)
[facility] Adult Case Management Discharge Plan :  Will you be returning to the same living situation after discharge: Yes,  home At discharge, do you have transportation home?:Yes,  family Do you have the ability to pay for your medications:Yes,  mental health  Release of information consent forms completed and in the chart;  Patient's signature needed at discharge.  Patient to Follow up at: Follow-up Information   Follow up with [facility]. (Go to the walk-in clinic between 8and 9AM M-F for your hospital follow up appointment)    Contact information:   [address]  [facility]  [phone]      Patient denies SI/HI:   Yes,  yes    Safety Planning and Suicide Prevention discussed:  Yes,  yes  Daryel Gerald B 01/12/2013, 10:30 AM

## 2013-01-12 NOTE — Progress Notes (Signed)
Seen and agreed. Jaely Silman, MD 

## 2013-01-12 NOTE — Progress Notes (Signed)
Patient ID: [name], female   DOB: June 16, 1960, 52 y.o.   MRN: [id number] Patient discharged home per MD order.  Patient received discharge instructions, medication samples and prescriptions.  Medications were reviewed with patient and personal belongings returned.  Patient denies any SI/HI/AVH.  She was appreciative of all the staff's assistance and thanked everyone.  She plans to follow up with [facility].  Advised her to follow up with her PCP regarding her high blood pressure.  Patient left ambulatory with her son.

## 2013-01-12 NOTE — Progress Notes (Signed)
Pt resting in bed with eyes closed.  No distress observed.  Safety maintained with q15 minute checks. 

## 2013-01-12 NOTE — [facility] Suicide Risk Assessment (Signed)
Suicide Risk Assessment  Discharge Assessment     Demographic Factors:  Low socioeconomic status, Unemployed and female  Mental Status Per Nursing Assessment::   On Admission:  Self-harm thoughts  Current Mental Status by Physician: patient denies suicidal ideation, intent or plan  Loss Factors: Financial problems/change in socioeconomic status  Historical Factors: Family history of mental illness or substance abuse and Impulsivity  Risk Reduction Factors:   Sense of responsibility to family, Living with another person, especially a relative and Positive social support  Continued Clinical Symptoms:  Alcohol/Substance Abuse/Dependencies  Cognitive Features That Contribute To Risk:  Closed-mindedness Polarized thinking    Suicide Risk:  Minimal: No identifiable suicidal ideation.  Patients presenting with no risk factors but with morbid ruminations; may be classified as minimal risk based on the severity of the depressive symptoms  Discharge Diagnoses:   AXIS I:  Alcohol Abuse. Schizoaffective disorder, unspecified condition  AXIS II:  Deferred AXIS III:   Past Medical History  Diagnosis Date  . Hypertension    AXIS IV:  other psychosocial or environmental problems and problems related to social environment AXIS V:  61-70 mild symptoms  Plan Of Care/Follow-up recommendations:  Activity:  as tolerated Diet:  healthy Tests:  routine Other:  patient to keep her after care appointment  Is patient on multiple antipsychotic therapies at discharge:  No   Has Patient had three or more failed trials of antipsychotic monotherapy by history:  No  Recommended Plan for Multiple Antipsychotic Therapies: N/A  Ashaya Raftery,MD 01/12/2013, 10:23 AM

## 2013-01-12 NOTE — Discharge Summary (Signed)
Physician Discharge Summary Note  Patient:  [name] is an 52 y.o., female MRN:  [id number] DOB:  01-Apr-1961 Patient phone:  [phone] (home)  Patient address:   [address] [place] [address]   Date of Admission:  01/07/2013 Date of Discharge: 01/12/13  Discharge Diagnoses: Principal Problem:   Schizoaffective disorder, unspecified condition  Axis Diagnosis:  AXIS I: Alcohol Abuse. Schizoaffective disorder, unspecified condition  AXIS II: Deferred  AXIS III:  Past Medical History   Diagnosis  Date   .  Hypertension    AXIS IV: other psychosocial or environmental problems and problems related to social environment  AXIS V: 61-70 mild symptoms   Level of Care:  OP  Hospital Course:   Patient is a 52 year old African American female admitted to [facility] behavioral Health on an involuntary basis. She reports prior history of schizophrenia diagnosed in 2001 and Alcohol dependence with occasional Marijuana abuse. She reports worsening depressive symptoms since she found her boyfriend dead about a month ago after caring for him for the past two years. They had been together for about seven years and he was on dialysis for the past two. She also reports that she has been hearing voices, seeing shadows , feeling paranoid and has been experiencing recurrent suicidal thoughts with no specific plan. However, she reports prior history of suicide attempt by overdosing on pills. She reports history of taking medication for Schizophrenia and depression in the past. But she stopped taking her medication few years ago after she lost her medicaid. She reports worsening depressive symptoms, poor appetite with weight loss, feeling hopeless and worthless.  While a patient in this hospital, [name] was enrolled in group counseling and activities as well as received the following medication Current facility-administered medications:acetaminophen (TYLENOL) tablet 650 mg, 650 mg, Oral, Q6H  PRN, Shuvon Rankin, NP, 650 mg at 01/10/13 1538;  alum & mag hydroxide-simeth (MAALOX/MYLANTA) 200-200-20 MG/5ML suspension 30 mL, 30 mL, Oral, Q4H PRN, Shuvon Rankin, NP;  benztropine (COGENTIN) tablet 0.5 mg, 0.5 mg, Oral, QHS, Mojeed Akintayo, 0.5 mg at 01/11/13 2111 feeding supplement (ENSURE COMPLETE) liquid 237 mL, 237 mL, Oral, Q24H, Anastasia Fiedler Jobe, RD, 237 mL at 01/11/13 1600;  FLUoxetine (PROZAC) capsule 30 mg, 30 mg, Oral, Daily, Fransisca Kaufmann, NP, 30 mg at 01/12/13 9562;  fluPHENAZine (PROLIXIN) tablet 5 mg, 5 mg, Oral, QHS, Mojeed Akintayo, 5 mg at 01/11/13 2112;  hydrochlorothiazide (HYDRODIURIL) tablet 25 mg, 25 mg, Oral, Daily, Mojeed Akintayo, 25 mg at 01/12/13 0814 lisinopril (PRINIVIL,ZESTRIL) tablet 5 mg, 5 mg, Oral, Daily, Fransisca Kaufmann, NP, 5 mg at 01/12/13 0813;  LORazepam (ATIVAN) tablet 1 mg, 1 mg, Oral, Q8H PRN, Shuvon Rankin, NP;  magnesium hydroxide (MILK OF MAGNESIA) suspension 30 mL, 30 mL, Oral, Daily PRN, Shuvon Rankin, NP;  nicotine (NICODERM CQ - dosed in mg/24 hours) patch 21 mg, 21 mg, Transdermal, Daily, Shuvon Rankin, NP, 21 mg at 01/11/13 0808 traZODone (DESYREL) tablet 50 mg, 50 mg, Oral, QHS PRN,MR X 1, Mojeed Akintayo, 50 mg at 01/11/13 2113 The patient was started on medications as she had not been taking any for some time. Patient was started on Prozac and Prolixin. Her medications were adjusted by the MD based on patient symptoms. She was also started on medication to control her blood pressure as it was elevated on admission. Patient was noted to have disorganized thought processes and observed responding to internal stimuli. The patient gradually began to respond to treatment and reported the voices  had stopped. She also reported that her symptoms of depression had decreased. Patient appeared invested in staying on medication after discharge and will follow up with [facility]. Patient received prescriptions and sample medications at discharge. She expressed an interest in  going to hospice for grief counseling to better deal with the death of her boyfriend.  Patient attended treatment team meeting this am and met with treatment team members. Pt symptoms, treatment plan and response to treatment discussed. [name] endorsed that their symptoms have improved. Pt also stated that they are stable for discharge.  In other to control Principal Problem:   Schizoaffective disorder, unspecified condition , they will continue psychiatric care on outpatient basis. They will follow-up at  Follow-up Information   Follow up with [facility]. (Go to the walk-in clinic between 8and 9AM M-F for your hospital follow up appointment)    Contact information:   [address]  [facility]  [phone]      Follow up with Hospice has counseling services. (They are at [facility] and the phone number is [phone].)     .  In addition they were instructed to take all your medications as prescribed by your mental healthcare provider, to report any adverse effects and or reactions from your medicines to your outpatient provider promptly, patient is instructed and cautioned to not engage in alcohol and or illegal drug use while on prescription medicines, in the event of worsening symptoms, patient is instructed to call the crisis hotline, 911 and or go to the nearest ED for appropriate evaluation and treatment of symptoms.   Upon discharge, patient adamantly denies suicidal, homicidal ideations, auditory, visual hallucinations and or delusional thinking. They left [facility] with all personal belongings in no apparent distress.  Consults:  See electronic record for details  Significant Diagnostic Studies:  See electronic record for details  Discharge Vitals:   Blood pressure 115/81, pulse 68, temperature 97 F (36.1 C), temperature source Oral, resp. rate 20, last menstrual period 10/25/2012, SpO2 100.00%..  Mental Status Exam: See Mental Status Examination and Suicide Risk Assessment  completed by Attending Physician prior to discharge.  Discharge destination:  Home  Is patient on multiple antipsychotic therapies at discharge:  No  Has Patient had three or more failed trials of antipsychotic monotherapy by history: N/A Recommended Plan for Multiple Antipsychotic Therapies: N/A     Discharge Orders   Future Orders Complete By Expires   Discharge instructions  As directed    Comments:     Please follow up with your Primary Care Provider after discharge for continued management of your high blood pressure.       Medication List    STOP taking these medications       BC HEADACHE POWDER PO      TAKE these medications     Indication   benztropine 0.5 MG tablet  Commonly known as:  COGENTIN  Take 1 tablet (0.5 mg total) by mouth at bedtime.   Indication:  Extrapyramidal Reaction caused by Medications     FLUoxetine 10 MG capsule  Commonly known as:  PROZAC  Take 3 capsules (30 mg total) by mouth daily.   Indication:  Depression     fluPHENAZine 5 MG tablet  Commonly known as:  PROLIXIN  Take 1 tablet (5 mg total) by mouth at bedtime.   Indication:  Psychosis     hydrochlorothiazide 25 MG tablet  Commonly known as:  HYDRODIURIL  Take 1 tablet (  25 mg total) by mouth daily.   Indication:  High Blood Pressure     lisinopril 5 MG tablet  Commonly known as:  PRINIVIL,ZESTRIL  Take 1 tablet (5 mg total) by mouth daily.   Indication:  High Blood Pressure     traZODone 50 MG tablet  Commonly known as:  DESYREL  Take 1 tablet (50 mg total) by mouth at bedtime as needed and may repeat dose one time if needed for sleep.   Indication:  Trouble Sleeping       Follow-up Information   Follow up with [facility]. (Go to the walk-in clinic between 8and 9AM M-F for your hospital follow up appointment)    Contact information:   [address]  [facility]  [phone]      Follow up with Hospice has counseling services. (They are at [facility] and the phone  number is [phone].)      Follow-up recommendations:   Activities: Resume typical activities Diet: Resume typical diet Tests: none Other: Follow up with outpatient provider and report any side effects to out patient prescriber.  Comments:  Take all your medications as prescribed by your mental healthcare provider. Report any adverse effects and or reactions from your medicines to your outpatient provider promptly. Patient is instructed and cautioned to not engage in alcohol and or illegal drug use while on prescription medicines. In the event of worsening symptoms, patient is instructed to call the crisis hotline, 911 and or go to the nearest ED for appropriate evaluation and treatment of symptoms. Follow-up with your primary care provider for your other medical issues, concerns and or health care needs.  SignedFransisca Kaufmann NP-C 01/12/2013 11:16 AM

## 2013-01-12 NOTE — [facility] Suicide Risk Assessment (Signed)
[facility] INPATIENT:  Family/Significant Other Suicide Prevention Education  Suicide Prevention Education:  Education Completed; [name], son, [phone] has been identified by the patient as the family member/significant other with whom the patient will be residing, and identified as the person(s) who will aid the patient in the event of a mental health crisis (suicidal ideations/suicide attempt).  With written consent from the patient, the family member/significant other has been provided the following suicide prevention education, prior to the and/or following the discharge of the patient.  The suicide prevention education provided includes the following:  Suicide risk factors  Suicide prevention and interventions  National Suicide Hotline telephone number  [facility] assessment telephone number  [facility] Emergency Assistance 911  [facility] and/or Residential Mobile Crisis Unit telephone number  Request made of family/significant other to:  Remove weapons (e.g., guns, rifles, knives), all items previously/currently identified as safety concern.    Remove drugs/medications (over-the-counter, prescriptions, illicit drugs), all items previously/currently identified as a safety concern.  The family member/significant other verbalizes understanding of the suicide prevention education information provided.  The family member/significant other agrees to remove the items of safety concern listed above.  [name] 01/12/2013, 10:28 AM

## 2013-01-12 NOTE — Tx Team (Signed)
  Interdisciplinary Treatment Plan Update   Date Reviewed:  01/12/2013  Time Reviewed:  10:30 AM  Progress in Treatment:   Attending groups: Yes Participating in groups: Yes Taking medication as prescribed: Yes  Tolerating medication: Yes Family/Significant other contact made: Yes  Patient understands diagnosis: Yes  Discussing patient identified problems/goals with staff: Yes Medical problems stabilized or resolved: Yes Denies suicidal/homicidal ideation: Yes Patient has not harmed self or others: Yes  For review of initial/current patient goals, please see plan of care.  Estimated Length of Stay:  D/C today  Reason for Continuation of Hospitalization:   New Problems/Goals identified:  N/A  Discharge Plan or Barriers:   return home, follow up outpt  Additional Comments:  Attendees:  Signature: Thedore Mins, MD 01/12/2013 10:30 AM   Signature: Richelle Ito, LCSW 01/12/2013 10:30 AM  Signature: Fransisca Kaufmann, NP 01/12/2013 10:30 AM  Signature: Joslyn Devon, RN 01/12/2013 10:30 AM  Signature: Liborio Nixon, RN 01/12/2013 10:30 AM  Signature:  01/12/2013 10:30 AM  Signature:   01/12/2013 10:30 AM  Signature:    Signature:    Signature:    Signature:    Signature:    Signature:      Scribe for Treatment Team:   Richelle Ito, LCSW  01/12/2013 10:30 AM

## 2013-01-14 NOTE — Discharge Summary (Signed)
Seen and agreed. Panagiotis Oelkers, MD 

## 2013-01-15 NOTE — Progress Notes (Signed)
Patient Discharge Instructions:  After Visit Summary (AVS):   Faxed to:  01/15/13 Discharge Summary Note:   Faxed to:  01/15/13 Psychiatric Admission Assessment Note:   Faxed to:  01/15/13 Suicide Risk Assessment - Discharge Assessment:   Faxed to:  01/15/13 Faxed/Sent to the Next Level Care provider:  01/15/13 Faxed to [facility] @ [phone]  Jerelene Redden, 01/15/2013, 3:49 PM

## 2014-06-07 ENCOUNTER — Encounter (HOSPITAL_COMMUNITY): Payer: Self-pay | Admitting: Emergency Medicine

## 2014-06-07 ENCOUNTER — Emergency Department (HOSPITAL_COMMUNITY)
Admission: EM | Admit: 2014-06-07 | Discharge: 2014-06-07 | Disposition: A | Discharge status: Home / Self Care | Payer: Self-pay | Attending: Emergency Medicine | Admitting: Emergency Medicine

## 2014-06-07 ENCOUNTER — Emergency Department (HOSPITAL_COMMUNITY): Payer: Self-pay

## 2014-06-07 DIAGNOSIS — Z720 Tobacco use: Secondary | ICD-10-CM | POA: Insufficient documentation

## 2014-06-07 DIAGNOSIS — M546 Pain in thoracic spine: Secondary | ICD-10-CM | POA: Insufficient documentation

## 2014-06-07 DIAGNOSIS — F329 Major depressive disorder, single episode, unspecified: Secondary | ICD-10-CM | POA: Insufficient documentation

## 2014-06-07 DIAGNOSIS — I10 Essential (primary) hypertension: Secondary | ICD-10-CM | POA: Insufficient documentation

## 2014-06-07 DIAGNOSIS — Z880 Allergy status to penicillin: Secondary | ICD-10-CM | POA: Insufficient documentation

## 2014-06-07 DIAGNOSIS — R0789 Other chest pain: Secondary | ICD-10-CM | POA: Insufficient documentation

## 2014-06-07 DIAGNOSIS — Z79899 Other long term (current) drug therapy: Secondary | ICD-10-CM | POA: Insufficient documentation

## 2014-06-07 DIAGNOSIS — F209 Schizophrenia, unspecified: Secondary | ICD-10-CM | POA: Insufficient documentation

## 2014-06-07 LAB — BASIC METABOLIC PANEL
ANION GAP: 9 (ref 5–15)
BUN: <5 mg/dL — ABNORMAL LOW (ref 6–23)
CO2: 27 mmol/L (ref 19–32)
CREATININE: 0.74 mg/dL (ref 0.50–1.10)
Calcium: 9.2 mg/dL (ref 8.4–10.5)
Chloride: 104 mmol/L (ref 96–112)
GFR calc Af Amer: >90 mL/min (ref >90)
Glucose, Bld: 136 mg/dL — ABNORMAL HIGH (ref 70–99)
Potassium: 3.3 mmol/L — ABNORMAL LOW (ref 3.5–5.1)
Sodium: 140 mmol/L (ref 135–145)

## 2014-06-07 LAB — CBC
HCT: 34.4 % — ABNORMAL LOW (ref 36.0–46.0)
Hemoglobin: 11.7 g/dL — ABNORMAL LOW (ref 12.0–15.0)
MCH: 33.1 pg (ref 26.0–34.0)
MCHC: 34 g/dL (ref 30.0–36.0)
MCV: 97.5 fL (ref 78.0–100.0)
Platelets: 241 10*3/uL (ref 150–400)
RBC: 3.53 MIL/uL — ABNORMAL LOW (ref 3.87–5.11)
RDW: 13 % (ref 11.5–15.5)
WBC: 5.4 10*3/uL (ref 4.0–10.5)

## 2014-06-07 LAB — I-STAT TROPONIN, ED: Troponin i, poc: 0 ng/mL (ref 0.00–0.08)

## 2014-06-07 LAB — BRAIN NATRIURETIC PEPTIDE: B Natriuretic Peptide: 214.2 pg/mL — ABNORMAL HIGH (ref 0.0–100.0)

## 2014-06-07 MED ORDER — KETOROLAC TROMETHAMINE 30 MG/ML IJ SOLN
30.0000 mg | Freq: Once | INTRAMUSCULAR | Status: AC
  Administered 2014-06-07: 30 mg via INTRAVENOUS
  Filled 2014-06-07: qty 1

## 2014-06-07 MED ORDER — PREDNISONE 20 MG PO TABS: 60.0000 mg | ORAL_TABLET | Freq: Every day | ORAL | Status: AC

## 2014-06-07 MED ORDER — SODIUM CHLORIDE 0.9 % IV BOLUS (SEPSIS)
1000.0000 mL | Freq: Once | INTRAVENOUS | Status: AC
  Administered 2014-06-07: 1000 mL via INTRAVENOUS

## 2014-06-07 MED ORDER — PREDNISONE 20 MG PO TABS
60.0000 mg | ORAL_TABLET | ORAL | Status: AC
  Administered 2014-06-07: 60 mg via ORAL
  Filled 2014-06-07: qty 3

## 2014-06-07 NOTE — Discharge Instructions (Signed)
As discussed, your evaluation today has been largely reassuring.  But, it is important that you monitor your condition carefully, and do not hesitate to return to the ED if you develop new, or concerning changes in your condition.  Your pain is likely due to inflammation of the chest wall, though with your history of cigarette smoking, there is likely a component of bronchitis as well.  It is important that you follow-up with both a primary care physician and a cardiologist for further evaluation, management of your condition.   Chest Pain (Nonspecific) It is often hard to give a diagnosis for the cause of chest pain. There is always a chance that your pain could be related to something serious, such as a heart attack or a blood clot in the lungs. You need to follow up with your doctor. HOME CARE  If antibiotic medicine was given, take it as directed by your doctor. Finish the medicine even if you start to feel better.  For the next few days, avoid activities that bring on chest pain. Continue physical activities as told by your doctor.  Do not use any tobacco products. This includes cigarettes, chewing tobacco, and e-cigarettes.  Avoid drinking alcohol.  Only take medicine as told by your doctor.  Follow your doctor's suggestions for more testing if your chest pain does not go away.  Keep all doctor visits you made. GET HELP IF:  Your chest pain does not go away, even after treatment.  You have a rash with blisters on your chest.  You have a fever. GET HELP RIGHT AWAY IF:   You have more pain or pain that spreads to your arm, neck, jaw, back, or belly (abdomen).  You have shortness of breath.  You cough more than usual or cough up blood.  You have very bad back or belly pain.  You feel sick to your stomach (nauseous) or throw up (vomit).  You have very bad weakness.  You pass out (faint).  You have chills. This is an emergency. Do not wait to see if the problems will  go away. Call your local emergency services (911 in [place] Do not drive yourself to the hospital. MAKE SURE YOU:   Understand these instructions.  Will watch your condition.  Will get help right away if you are not doing well or get worse. Document Released: 10/16/2007 Document Revised: 05/04/2013 Document Reviewed: 10/16/2007 [facility] Patient Information 2015 [place], [place]. This information is not intended to replace advice given to you by your health care provider. Make sure you discuss any questions you have with your health care provider.

## 2014-06-07 NOTE — ED Notes (Signed)
EDP at bedside  

## 2014-06-07 NOTE — ED Notes (Signed)
Pt c/o right sided CP worse with cough and movement

## 2014-06-07 NOTE — ED Notes (Signed)
Pt refused wheelchair at discharge.  Gait steady.  Pt ambulated to exit with family member and Therapist, sports.

## 2014-06-07 NOTE — ED Provider Notes (Signed)
CSN: [id number]     Arrival date & time 06/07/14  8329 History   First MD Initiated Contact with Patient 06/07/14 [id number]     Chief Complaint  Patient presents with  . Chest Pain     (Consider location/radiation/quality/duration/timing/severity/associated sxs/prior Treatment) HPI Patient presents with concern of new chest pain.  Pain began approximately 10 days ago.  Since onset pain has been persistent, worse with positioning, or coughing. The pain is focally about the right upper chest, with occasional right upper back pain. There is no left-sided chest pain, no syncope, near syncope. No nausea, vomiting, fever, chills. Patient recalls bending over picking something up the night prior to the onset of pain. Patient has a 19 pack years of smoking history. Patient was counseled on the need for smoking cessation.  Past Medical History  Diagnosis Date  . Hypertension   . Mental disorder   . Depression   . Bipolar 1 disorder   . Schizophrenia    Past Surgical History  Procedure Laterality Date  . Tubal ligation     History reviewed. No pertinent family history. History  Substance Use Topics  . Smoking status: Current Every Day Smoker -- 0.50 packs/day for 38 years    Types: Cigarettes  . Smokeless tobacco: Not on file  . Alcohol Use: 2.4 oz/week    4 Cans of beer per week     Comment: every other day   OB History    No data available     Review of Systems  Constitutional:       Per HPI, otherwise negative  HENT:       Per HPI, otherwise negative  Respiratory:       Per HPI, otherwise negative  Cardiovascular:       Per HPI, otherwise negative  Gastrointestinal: Negative for vomiting.  Endocrine:       Negative aside from HPI  Genitourinary:       Neg aside from HPI   Musculoskeletal:       Per HPI, otherwise negative  Skin: Negative.   Neurological: Negative for syncope.      Allergies  Penicillins  Home Medications   Prior to Admission medications    Medication Sig Start Date End Date Taking? Authorizing Provider  benztropine (COGENTIN) 0.5 MG tablet Take 1 tablet (0.5 mg total) by mouth at bedtime. 01/12/13   Elmarie Shiley, NP  FLUoxetine (PROZAC) 10 MG capsule Take 3 capsules (30 mg total) by mouth daily. 01/12/13   Elmarie Shiley, NP  fluPHENAZine (PROLIXIN) 5 MG tablet Take 1 tablet (5 mg total) by mouth at bedtime. 01/12/13   Elmarie Shiley, NP  hydrochlorothiazide (HYDRODIURIL) 25 MG tablet Take 1 tablet (25 mg total) by mouth daily. 01/12/13   Elmarie Shiley, NP  lisinopril (PRINIVIL,ZESTRIL) 5 MG tablet Take 1 tablet (5 mg total) by mouth daily. 01/12/13   Elmarie Shiley, NP  traZODone (DESYREL) 50 MG tablet Take 1 tablet (50 mg total) by mouth at bedtime as needed and may repeat dose one time if needed for sleep. 01/12/13   Elmarie Shiley, NP   BP 154/95 mmHg  Pulse 69  Temp(Src) 98.4 F (36.9 C) (Oral)  Resp 22  SpO2 100% Physical Exam  Constitutional: She is oriented to person, place, and time. She appears well-developed and well-nourished. No distress.  HENT:  Head: Normocephalic and atraumatic.  Eyes: Conjunctivae and EOM are normal.  Cardiovascular: Normal rate and regular rhythm.   Pulmonary/Chest: Effort normal and breath sounds normal.  No stridor. No respiratory distress.  Tenderness to palpation about the right upper chest, without deformity  Abdominal: She exhibits no distension. There is no tenderness.  Musculoskeletal: She exhibits no edema.  Neurological: She is alert and oriented to person, place, and time. No cranial nerve deficit.  Skin: Skin is warm and dry.  Psychiatric: She has a normal mood and affect.  Nursing note and vitals reviewed.   ED Course  Procedures (including critical care time) Labs Review Labs Reviewed  CBC - Abnormal; Notable for the following:    RBC 3.53 (*)    Hemoglobin 11.7 (*)    HCT 34.4 (*)    All other components within normal limits  BASIC METABOLIC PANEL - Abnormal; Notable for the following:     Potassium 3.3 (*)    Glucose, Bld 136 (*)    BUN <5 (*)    All other components within normal limits  BRAIN NATRIURETIC PEPTIDE - Abnormal; Notable for the following:    B Natriuretic Peptide 214.2 (*)    All other components within normal limits  I-STAT TROPOININ, ED    Imaging Review I reviewed the x-ray, agree with the interpretation.   EKG Interpretation   Date/Time:  Tuesday June 07 2014 09:20:05 EST Ventricular Rate:  65 PR Interval:  126 QRS Duration: 76 QT Interval:  430 QTC Calculation: 447 R Axis:   63 Text Interpretation:  Normal sinus rhythm Septal infarct , age  undetermined Abnormal ECG Sinus rhythm Artifact Abnormal ekg Confirmed by  Carmin Muskrat  MD [phone]) on 06/07/2014 12:03:36 PM     12:03 PM On repeat exam the patient is in no distress.  MDM  Patient presents with ongoing right-sided chest pain. On exam patient is awake, alert, hemodynamically stable, in no distress. Patient is a smoker, and has no recent cardiology evaluation, but given the chronicity of her pain, the absence of ischemic changes, the reassuring troponin, there is low suspicion for ongoing coronary ischemia. Patient's reproducible pain, suggests inflammatory condition. Patient was discharged in stable condition to follow-up with primary care, after initiation of steroid therapy, for her likely inflammatory chest pain.  Carmin Muskrat, MD 06/07/14 [phone]

## 2014-10-17 ENCOUNTER — Other Ambulatory Visit (HOSPITAL_COMMUNITY): Payer: Self-pay | Admitting: Primary Care

## 2014-10-17 DIAGNOSIS — Z1231 Encounter for screening mammogram for malignant neoplasm of breast: Secondary | ICD-10-CM

## 2014-10-25 ENCOUNTER — Ambulatory Visit (HOSPITAL_COMMUNITY)
Admission: RE | Admit: 2014-10-25 | Discharge: 2014-10-25 | Disposition: A | Discharge status: Home / Self Care | Payer: Self-pay | Source: Ambulatory Visit | Attending: Primary Care | Admitting: Primary Care

## 2014-10-25 DIAGNOSIS — Z1231 Encounter for screening mammogram for malignant neoplasm of breast: Secondary | ICD-10-CM

## 2015-02-02 ENCOUNTER — Emergency Department (HOSPITAL_COMMUNITY)
Admission: EM | Admit: 2015-02-02 | Discharge: 2015-02-02 | Disposition: A | Discharge status: Home / Self Care | Payer: Self-pay | Attending: Emergency Medicine | Admitting: Emergency Medicine

## 2015-02-02 ENCOUNTER — Encounter (HOSPITAL_COMMUNITY): Payer: Self-pay | Admitting: *Deleted

## 2015-02-02 DIAGNOSIS — R51 Headache: Secondary | ICD-10-CM | POA: Insufficient documentation

## 2015-02-02 DIAGNOSIS — H9201 Otalgia, right ear: Secondary | ICD-10-CM | POA: Insufficient documentation

## 2015-02-02 DIAGNOSIS — Z720 Tobacco use: Secondary | ICD-10-CM | POA: Insufficient documentation

## 2015-02-02 DIAGNOSIS — F329 Major depressive disorder, single episode, unspecified: Secondary | ICD-10-CM | POA: Insufficient documentation

## 2015-02-02 DIAGNOSIS — K088 Other specified disorders of teeth and supporting structures: Secondary | ICD-10-CM | POA: Insufficient documentation

## 2015-02-02 DIAGNOSIS — K029 Dental caries, unspecified: Secondary | ICD-10-CM | POA: Insufficient documentation

## 2015-02-02 DIAGNOSIS — Z880 Allergy status to penicillin: Secondary | ICD-10-CM | POA: Insufficient documentation

## 2015-02-02 DIAGNOSIS — K0889 Other specified disorders of teeth and supporting structures: Secondary | ICD-10-CM

## 2015-02-02 DIAGNOSIS — I10 Essential (primary) hypertension: Secondary | ICD-10-CM | POA: Insufficient documentation

## 2015-02-02 DIAGNOSIS — Z79899 Other long term (current) drug therapy: Secondary | ICD-10-CM | POA: Insufficient documentation

## 2015-02-02 DIAGNOSIS — F99 Mental disorder, not otherwise specified: Secondary | ICD-10-CM | POA: Insufficient documentation

## 2015-02-02 DIAGNOSIS — F209 Schizophrenia, unspecified: Secondary | ICD-10-CM | POA: Insufficient documentation

## 2015-02-02 DIAGNOSIS — J029 Acute pharyngitis, unspecified: Secondary | ICD-10-CM | POA: Insufficient documentation

## 2015-02-02 MED ORDER — HYDROCODONE-ACETAMINOPHEN 5-325 MG PO TABS: 2.0000 | ORAL_TABLET | ORAL | Status: DC | PRN

## 2015-02-02 MED ORDER — HYDROCODONE-ACETAMINOPHEN 5-325 MG PO TABS
2.0000 | ORAL_TABLET | Freq: Once | ORAL | Status: AC
  Administered 2015-02-02: 2 via ORAL
  Filled 2015-02-02: qty 2

## 2015-02-02 NOTE — ED Notes (Signed)
Pt in c/o toothache for the last three days, denies fever, no distress noted

## 2015-02-02 NOTE — ED Notes (Signed)
Pt c/o right lower dental pain x 3 days.

## 2015-02-02 NOTE — Discharge Instructions (Signed)
Dental Pain Follow up with a dentist. Return for fever, facial or gum swelling. A tooth ache may be caused by cavities (tooth decay). Cavities expose the nerve of the tooth to air and hot or cold temperatures. It may come from an infection or abscess (also called a boil or furuncle) around your tooth. It is also often caused by dental caries (tooth decay). This causes the pain you are having. DIAGNOSIS  Your caregiver can diagnose this problem by exam. TREATMENT   If caused by an infection, it may be treated with medications which kill germs (antibiotics) and pain medications as prescribed by your caregiver. Take medications as directed.  Only take over-the-counter or prescription medicines for pain, discomfort, or fever as directed by your caregiver.  Whether the tooth ache today is caused by infection or dental disease, you should see your dentist as soon as possible for further care. SEEK MEDICAL CARE IF: The exam and treatment you received today has been provided on an emergency basis only. This is not a substitute for complete medical or dental care. If your problem worsens or new problems (symptoms) appear, and you are unable to meet with your dentist, call or return to this location. SEEK IMMEDIATE MEDICAL CARE IF:   You have a fever.  You develop redness and swelling of your face, jaw, or neck.  You are unable to open your mouth.  You have severe pain uncontrolled by pain medicine. MAKE SURE YOU:   Understand these instructions.  Will watch your condition.  Will get help right away if you are not doing well or get worse. Document Released: 04/29/2005 Document Revised: 07/22/2011 Document Reviewed: 12/16/2007 [facility] Patient Information 2015 [place], [place]. This information is not intended to replace advice given to you by your health care provider. Make sure you discuss any questions you have with your health care provider.

## 2015-02-02 NOTE — ED Provider Notes (Signed)
CSN: [id number]     Arrival date & time 02/02/15  1721 History  This chart was scribed for non-physician practitioner Ottie Glazier, PA-C working with No att. providers found by Meriel Pica, ED Scribe. This patient was seen in room TR04C/TR04C and the patient's care was started at 5:38 PM.    Chief Complaint  Patient presents with  . Dental Pain    The history is provided by the patient. No language interpreter was used.   HPI Comments: [name] is a 54 y.o. female who presents to the Emergency Department complaining of worsening, constant, lower right dental pain onset 3 days ago. Pt reports an associated right-sided sore throat, right-sided headache and right-sided otalgia. Pt has taken several OTC pain medication including 800mg  ibuprofen without relief. Pt has not been evaluated by a dentist. Denies surrounding gingival swelling or drainage from the area.   Past Medical History  Diagnosis Date  . Hypertension   . Mental disorder   . Depression   . Bipolar 1 disorder   . Schizophrenia    Past Surgical History  Procedure Laterality Date  . Tubal ligation     History reviewed. No pertinent family history. Social History  Substance Use Topics  . Smoking status: Current Every Day Smoker -- 0.50 packs/day for 38 years    Types: Cigarettes  . Smokeless tobacco: None  . Alcohol Use: 2.4 oz/week    4 Cans of beer per week     Comment: every other day   OB History    No data available     Review of Systems  HENT: Positive for dental problem ( lower right), ear pain ( right-sided) and sore throat.   Neurological: Positive for headaches ( right-sided).   Allergies  Penicillins  Home Medications   Prior to Admission medications   Medication Sig Start Date End Date Taking? Authorizing Provider  benztropine (COGENTIN) 0.5 MG tablet Take 1 tablet (0.5 mg total) by mouth at bedtime. 01/12/13   Niel Hummer, NP  FLUoxetine (PROZAC) 10 MG capsule Take 3 capsules (30  mg total) by mouth daily. 01/12/13   Niel Hummer, NP  fluPHENAZine (PROLIXIN) 5 MG tablet Take 1 tablet (5 mg total) by mouth at bedtime. 01/12/13   Niel Hummer, NP  hydrochlorothiazide (HYDRODIURIL) 25 MG tablet Take 1 tablet (25 mg total) by mouth daily. 01/12/13   Niel Hummer, NP  HYDROcodone-acetaminophen (NORCO/VICODIN) 5-325 MG per tablet Take 2 tablets by mouth every 4 (four) hours as needed. 02/02/15   Hanna Patel-Mills, PA-C  lisinopril (PRINIVIL,ZESTRIL) 5 MG tablet Take 1 tablet (5 mg total) by mouth daily. 01/12/13   Niel Hummer, NP  traZODone (DESYREL) 50 MG tablet Take 1 tablet (50 mg total) by mouth at bedtime as needed and may repeat dose one time if needed for sleep. 01/12/13   Niel Hummer, NP   BP 148/96 mmHg  Pulse 80  Temp(Src) 98.5 F (36.9 C) (Oral)  Resp 20  SpO2 100% Physical Exam  Constitutional: She is oriented to person, place, and time. She appears well-developed and well-nourished. No distress.  HENT:  Head: Normocephalic and atraumatic.  Right Ear: Tympanic membrane, external ear and ear canal normal.  Left Ear: Tympanic membrane and ear canal normal.  Mouth/Throat: Uvula is midline. She has dentures. Abnormal dentition. Dental caries present. No dental abscesses or uvula swelling.    Poor dentition and dental caries throughout, TTP to last tooth on lower right, no fluctuance.  No facial swelling or gum swelling. No fluctuance on palpation of the teeth. Patent airway. No anterior cervical swelling.  She has partial lower dentures. Several missing teeth.  Normal ear canal, external ear, and TMs bilaterally.  Eyes: Conjunctivae are normal.  Neck: Normal range of motion. Neck supple.  Cardiovascular: Normal rate.   Pulmonary/Chest: Effort normal. No respiratory distress.  Musculoskeletal: Normal range of motion.  Neurological: She is alert and oriented to person, place, and time. Coordination normal.  Skin: Skin is warm.  Psychiatric: She has a normal  mood and affect. Her behavior is normal.  Nursing note and vitals reviewed.   ED Course  Procedures  DIAGNOSTIC STUDIES: Oxygen Saturation is 100% on RA, normal by my interpretation.    COORDINATION OF CARE: 5:41 PM Discussed treatment plan with pt at bedside which includes to give referral to dentist. Pt acknowledged  and pt agreed to plan.   MDM   Final diagnoses:  Pain, dental  Patient presents for dental pain and right ear pain. I believe her ear pain is radiating pain from her tooth pain. She has no signs of otitis external or media. She has no signs of dental abscess or Ludwig's angina. She is afebrile and well-appearing. I discussed following up with a dentist and gave her several referrals. Patient verbally agrees with the plan. Medications  HYDROcodone-acetaminophen (NORCO/VICODIN) 5-325 MG per tablet 2 tablet (2 tablets Oral Given 02/02/15 1748)   Filed Vitals:   02/02/15 1748  BP: 151/93  Pulse: 64  Temp: 98.6 F (37 C)  Resp: 18   I personally performed the services described in this documentation, which was scribed in my presence. The recorded information has been reviewed and is accurate.    Ottie Glazier, PA-C 02/02/15 2127  Wandra Arthurs, MD 02/02/15 [phone]

## 2015-08-22 ENCOUNTER — Encounter (HOSPITAL_COMMUNITY): Payer: Self-pay | Admitting: Emergency Medicine

## 2015-08-22 ENCOUNTER — Ambulatory Visit (INDEPENDENT_AMBULATORY_CARE_PROVIDER_SITE_OTHER): Payer: Self-pay

## 2015-08-22 ENCOUNTER — Ambulatory Visit (HOSPITAL_COMMUNITY)
Admission: EM | Admit: 2015-08-22 | Discharge: 2015-08-22 | Disposition: A | Discharge status: Home / Self Care | Payer: Self-pay | Attending: Family Medicine | Admitting: Family Medicine

## 2015-08-22 DIAGNOSIS — R002 Palpitations: Secondary | ICD-10-CM

## 2015-08-22 DIAGNOSIS — R0602 Shortness of breath: Secondary | ICD-10-CM

## 2015-08-22 NOTE — ED Notes (Signed)
The patient presented to the [facility] with a complaint of shortness of breath x 3 weeks.

## 2015-08-22 NOTE — ED Provider Notes (Addendum)
CSN: [id number]     Arrival date & time 08/22/15  1618 History   First MD Initiated Contact with Patient 08/22/15 1710     Chief Complaint  Patient presents with  . Shortness of Breath   (Consider location/radiation/quality/duration/timing/severity/associated sxs/prior Treatment) Patient is a 55 y.o. female presenting with shortness of breath. The history is provided by the patient.  Shortness of Breath Severity:  Moderate Onset quality:  Gradual Duration:  3 weeks Progression:  Worsening Chronicity:  Chronic Context: smoke exposure   Context comment:  Amoker since age 58. Relieved by:  None tried Worsened by:  Nothing tried Ineffective treatments:  None tried Associated symptoms: no chest pain, no claudication, no fever, no PND, no rash and no wheezing   Risk factors: alcohol use and tobacco use     Past Medical History  Diagnosis Date  . Hypertension   . Mental disorder   . Depression   . Bipolar 1 disorder ([place])   . Schizophrenia [facility])    Past Surgical History  Procedure Laterality Date  . Tubal ligation     History reviewed. No pertinent family history. Social History  Substance Use Topics  . Smoking status: Current Every Day Smoker -- 0.50 packs/day for 38 years    Types: Cigarettes  . Smokeless tobacco: None  . Alcohol Use: 2.4 oz/week    4 Cans of beer per week     Comment: every other day   OB History    No data available     Review of Systems  Constitutional: Negative.  Negative for fever.  Respiratory: Positive for shortness of breath. Negative for wheezing.   Cardiovascular: Positive for palpitations. Negative for chest pain, claudication, leg swelling and PND.  Skin: Negative for rash.  All other systems reviewed and are negative.   Allergies  Penicillins  Home Medications   Prior to Admission medications   Medication Sig Start Date End Date Taking? Authorizing Provider  benztropine (COGENTIN) 0.5 MG tablet Take 1 tablet (0.5 mg total) by  mouth at bedtime. 01/12/13   Niel Hummer, NP  FLUoxetine (PROZAC) 10 MG capsule Take 3 capsules (30 mg total) by mouth daily. 01/12/13   Niel Hummer, NP  fluPHENAZine (PROLIXIN) 5 MG tablet Take 1 tablet (5 mg total) by mouth at bedtime. 01/12/13   Niel Hummer, NP  hydrochlorothiazide (HYDRODIURIL) 25 MG tablet Take 1 tablet (25 mg total) by mouth daily. 01/12/13   Niel Hummer, NP  HYDROcodone-acetaminophen (NORCO/VICODIN) 5-325 MG per tablet Take 2 tablets by mouth every 4 (four) hours as needed. 02/02/15   Hanna Patel-Mills, PA-C  lisinopril (PRINIVIL,ZESTRIL) 5 MG tablet Take 1 tablet (5 mg total) by mouth daily. 01/12/13   Niel Hummer, NP  traZODone (DESYREL) 50 MG tablet Take 1 tablet (50 mg total) by mouth at bedtime as needed and may repeat dose one time if needed for sleep. 01/12/13   Niel Hummer, NP   Meds Ordered and Administered this Visit  Medications - No data to display  BP 136/95 mmHg  Pulse 87  Temp(Src) 98.5 F (36.9 C) (Oral)  SpO2 97% No data found.   Physical Exam  Constitutional: She is oriented to person, place, and time. She appears well-developed and well-nourished.  Neck: Normal range of motion. Neck supple.  Cardiovascular: Normal rate, regular rhythm, normal heart sounds and intact distal pulses.   Pulmonary/Chest: Effort normal. She has decreased breath sounds. She has no wheezes.  Lymphadenopathy:  She has no cervical adenopathy.  Neurological: She is alert and oriented to person, place, and time.  Skin: Skin is warm and dry.  Nursing note and vitals reviewed.   ED Course  Procedures (including critical care time)  Labs Review Labs Reviewed - No data to display  Imaging Review No results found.   Visual Acuity Review  Right Eye Distance:   Left Eye Distance:   Bilateral Distance:    Right Eye Near:   Left Eye Near:    Bilateral Near:         MDM  No diagnosis found.     Billy Fischer, MD 08/24/15 2143  Billy Fischer,  MD 09/03/15 1257

## 2015-09-26 ENCOUNTER — Other Ambulatory Visit: Payer: Self-pay

## 2015-10-03 ENCOUNTER — Other Ambulatory Visit: Payer: Self-pay | Admitting: Primary Care

## 2015-10-03 DIAGNOSIS — Z1231 Encounter for screening mammogram for malignant neoplasm of breast: Secondary | ICD-10-CM

## 2015-10-27 ENCOUNTER — Ambulatory Visit
Admission: RE | Admit: 2015-10-27 | Discharge: 2015-10-27 | Disposition: A | Discharge status: Home / Self Care | Payer: No Typology Code available for payment source | Source: Ambulatory Visit | Attending: Primary Care | Admitting: Primary Care

## 2015-10-27 DIAGNOSIS — Z1231 Encounter for screening mammogram for malignant neoplasm of breast: Secondary | ICD-10-CM

## 2016-09-04 ENCOUNTER — Ambulatory Visit (HOSPITAL_COMMUNITY)
Admission: EM | Admit: 2016-09-04 | Discharge: 2016-09-04 | Disposition: A | Discharge status: Home / Self Care | Payer: No Typology Code available for payment source | Attending: Family Medicine | Admitting: Family Medicine

## 2016-09-04 ENCOUNTER — Encounter (HOSPITAL_COMMUNITY): Payer: Self-pay | Admitting: Emergency Medicine

## 2016-09-04 DIAGNOSIS — M791 Myalgia: Secondary | ICD-10-CM

## 2016-09-04 DIAGNOSIS — M7918 Myalgia, other site: Secondary | ICD-10-CM

## 2016-09-04 DIAGNOSIS — M545 Low back pain: Secondary | ICD-10-CM

## 2016-09-04 MED ORDER — CYCLOBENZAPRINE HCL 10 MG PO TABS: 10.0000 mg | ORAL_TABLET | Freq: Two times a day (BID) | ORAL | 0 refills | Status: DC | PRN

## 2016-09-04 MED ORDER — DICLOFENAC SODIUM 75 MG PO TBEC: 75.0000 mg | DELAYED_RELEASE_TABLET | Freq: Two times a day (BID) | ORAL | 0 refills | Status: DC
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
## 2016-09-17 ENCOUNTER — Other Ambulatory Visit: Payer: Self-pay | Admitting: Primary Care

## 2016-09-18 ENCOUNTER — Other Ambulatory Visit: Payer: Self-pay | Admitting: Primary Care

## 2016-09-18 DIAGNOSIS — Z1231 Encounter for screening mammogram for malignant neoplasm of breast: Secondary | ICD-10-CM

## 2016-10-28 ENCOUNTER — Ambulatory Visit
Admission: RE | Admit: 2016-10-28 | Discharge: 2016-10-28 | Disposition: A | Discharge status: Home / Self Care | Payer: No Typology Code available for payment source | Source: Ambulatory Visit | Attending: Primary Care | Admitting: Primary Care

## 2016-10-28 DIAGNOSIS — Z1231 Encounter for screening mammogram for malignant neoplasm of breast: Secondary | ICD-10-CM

## 2017-09-29 ENCOUNTER — Other Ambulatory Visit: Payer: Self-pay | Admitting: Primary Care

## 2017-09-29 DIAGNOSIS — Z1231 Encounter for screening mammogram for malignant neoplasm of breast: Secondary | ICD-10-CM

## 2017-12-03 ENCOUNTER — Ambulatory Visit
Admission: RE | Admit: 2017-12-03 | Discharge: 2017-12-03 | Disposition: A | Discharge status: Home / Self Care | Payer: No Typology Code available for payment source | Source: Ambulatory Visit | Attending: Primary Care | Admitting: Primary Care

## 2017-12-03 DIAGNOSIS — Z1231 Encounter for screening mammogram for malignant neoplasm of breast: Secondary | ICD-10-CM

## 2018-05-03 ENCOUNTER — Encounter (HOSPITAL_COMMUNITY): Payer: Self-pay | Admitting: Emergency Medicine

## 2018-05-03 ENCOUNTER — Other Ambulatory Visit: Payer: Self-pay

## 2018-05-03 ENCOUNTER — Emergency Department (HOSPITAL_COMMUNITY)
Admission: EM | Admit: 2018-05-03 | Discharge: 2018-05-03 | Disposition: A | Discharge status: Home / Self Care | Payer: Medicaid Other | Attending: Emergency Medicine | Admitting: Emergency Medicine

## 2018-05-03 DIAGNOSIS — Z79899 Other long term (current) drug therapy: Secondary | ICD-10-CM | POA: Insufficient documentation

## 2018-05-03 DIAGNOSIS — F17210 Nicotine dependence, cigarettes, uncomplicated: Secondary | ICD-10-CM | POA: Diagnosis not present

## 2018-05-03 DIAGNOSIS — M545 Low back pain, unspecified: Secondary | ICD-10-CM

## 2018-05-03 DIAGNOSIS — I10 Essential (primary) hypertension: Secondary | ICD-10-CM | POA: Diagnosis not present

## 2018-05-03 LAB — URINALYSIS, ROUTINE W REFLEX MICROSCOPIC
BILIRUBIN URINE: NEGATIVE
GLUCOSE, UA: NEGATIVE mg/dL
Hgb urine dipstick: NEGATIVE
Ketones, ur: NEGATIVE mg/dL
Leukocytes, UA: NEGATIVE
Nitrite: NEGATIVE
PH: 8 (ref 5.0–8.0)
Protein, ur: NEGATIVE mg/dL
Specific Gravity, Urine: 1.01 (ref 1.005–1.030)

## 2018-05-03 MED ORDER — METHOCARBAMOL 500 MG PO TABS: 500.0000 mg | ORAL_TABLET | Freq: Every evening | ORAL | 0 refills | Status: DC | PRN

## 2018-05-03 MED ORDER — METHOCARBAMOL 500 MG PO TABS
500.0000 mg | ORAL_TABLET | Freq: Once | ORAL | Status: AC
  Administered 2018-05-03: 500 mg via ORAL
  Filled 2018-05-03: qty 1

## 2018-05-03 MED ORDER — NAPROXEN 500 MG PO TABS: 500.0000 mg | ORAL_TABLET | Freq: Two times a day (BID) | ORAL | 0 refills | Status: AC

## 2018-05-03 MED ORDER — KETOROLAC TROMETHAMINE 15 MG/ML IJ SOLN
15.0000 mg | Freq: Once | INTRAMUSCULAR | Status: AC
  Administered 2018-05-03: 15 mg via INTRAVENOUS
  Filled 2018-05-03: qty 1

## 2018-05-03 NOTE — Discharge Instructions (Addendum)
Take naproxen 2 times a day with meals.  Do not take other anti-inflammatories at the same time (Advil, Motrin, ibuprofen, Aleve). You may supplement with Tylenol if you need further pain control. Use Robaxin as needed for muscle stiffness or soreness. Have caution, as this may make you tired or groggy. Do not drive or operate heavy machinery while taking this medication.  Use muscle creams (bengay, icy hot, salonpas) as needed for pain.  Use heat/ice and stretches to relax your muscles.  Follow up with primary care doctor if pain is not improving with this treatment in 2 weeks. There is information for a clinic listed below.  Return to the ER if you develop high fevers, numbness, loss of bowel or bladder control, or any new or concerning symptoms.

## 2018-05-03 NOTE — ED Triage Notes (Signed)
Pt c/o left lower back pain x 2 days. Denies injury/trauma, no urinary symptoms. Ambulatory.

## 2018-05-03 NOTE — ED Provider Notes (Signed)
[facility] Provider Note   CSN: [id number] Arrival date & time: 05/03/18  0247     History   Chief Complaint Chief Complaint  Patient presents with  . Back Pain    HPI [name] is a 57 y.o. female presenting for evaluation of back pain.   States she developed low back pain 2 days ago.  It is on the left low side, and occasionally radiates down her left leg.  She denies fall, trauma, or injury.  Pain began after she walked to and from the store twice and was standing cooking dinner.  She denies precipitating twist or turn.  She denies fevers, chills, nausea, vomiting, anterior domino pain, urinary symptoms, normal bowel movements.  Pain is worse with movement, nothing makes it better.  She is taken a BC powder without improvement of symptoms.  She denies history of kidney stones.  Patient states she has no medical problems, takes no medications daily.  Additional history obtained from chart review, patient with a documented history of hypertension, bipolar, and schizophrenia.  She is supposed to be on multiple different medications, including blood pressure medication.  HPI  Past Medical History:  Diagnosis Date  . Bipolar 1 disorder ([place])   . Depression   . Hypertension   . Mental disorder   . Schizophrenia [facility])     Patient Active Problem List   Diagnosis Date Noted  . Schizoaffective disorder, unspecified condition 01/08/2013  . Severe bipolar disorder with psychotic features ([place]) 01/07/2013  . ALCOHOLISM 11/04/2009  . DEPRESSION, RECURRENT 11/04/2009  . ESSENTIAL HYPERTENSION, BENIGN 11/04/2009  . ABDOMINAL PAIN, GENERALIZED 11/04/2009    Past Surgical History:  Procedure Laterality Date  . TUBAL LIGATION       OB History   No obstetric history on file.      Home Medications    Prior to Admission medications   Medication Sig Start Date End Date Taking? Authorizing Provider  benztropine (COGENTIN) 0.5 MG  tablet Take 1 tablet (0.5 mg total) by mouth at bedtime. 01/12/13   Niel Hummer, NP  cyclobenzaprine (FLEXERIL) 10 MG tablet Take 1 tablet (10 mg total) by mouth 2 (two) times daily as needed for muscle spasms. 09/04/16   Barnet Glasgow, NP  diclofenac (VOLTAREN) 75 MG EC tablet Take 1 tablet (75 mg total) by mouth 2 (two) times daily. 09/04/16   Barnet Glasgow, NP  FLUoxetine (PROZAC) 10 MG capsule Take 3 capsules (30 mg total) by mouth daily. 01/12/13   Niel Hummer, NP  fluPHENAZine (PROLIXIN) 5 MG tablet Take 1 tablet (5 mg total) by mouth at bedtime. 01/12/13   Niel Hummer, NP  hydrochlorothiazide (HYDRODIURIL) 25 MG tablet Take 1 tablet (25 mg total) by mouth daily. 01/12/13   Niel Hummer, NP  HYDROcodone-acetaminophen (NORCO/VICODIN) 5-325 MG per tablet Take 2 tablets by mouth every 4 (four) hours as needed. 02/02/15   Patel-Mills, Orvil Feil, PA-C  lisinopril (PRINIVIL,ZESTRIL) 5 MG tablet Take 1 tablet (5 mg total) by mouth daily. 01/12/13   Niel Hummer, NP  methocarbamol (ROBAXIN) 500 MG tablet Take 1 tablet (500 mg total) by mouth at bedtime as needed for muscle spasms. 05/03/18   Jakhai Fant, PA-C  naproxen (NAPROSYN) 500 MG tablet Take 1 tablet (500 mg total) by mouth 2 (two) times daily with a meal for 7 days. 05/03/18 05/10/18  Corrisa Gibby, PA-C  traZODone (DESYREL) 50 MG tablet Take 1 tablet (50 mg total) by mouth at  bedtime as needed and may repeat dose one time if needed for sleep. 01/12/13   Niel Hummer, NP    Family History Family History  Problem Relation Age of Onset  . Breast cancer Maternal Aunt     Social History Social History   Tobacco Use  . Smoking status: Current Every Day Smoker    Packs/day: 0.50    Years: 38.00    Pack years: 19.00    Types: Cigarettes  . Smokeless tobacco: Never Used  Substance Use Topics  . Alcohol use: Yes    Alcohol/week: 4.0 standard drinks    Types: 4 Cans of beer per week    Comment: every other day  . Drug use:  Yes    Types: Marijuana    Comment: THC about once per week     Allergies   Penicillins   Review of Systems Review of Systems  Musculoskeletal: Positive for back pain.  All other systems reviewed and are negative.    Physical Exam Updated Vital Signs BP (!) 158/102   Pulse 71   Temp 98.3 F (36.8 C) (Oral)   Resp 18   SpO2 98%   Physical Exam Vitals signs and nursing note reviewed.  Constitutional:      General: She is not in acute distress.    Appearance: She is well-developed.     Comments: Pt appears uncomfortable due to pain, in NAD  HENT:     Head: Normocephalic and atraumatic.  Eyes:     Conjunctiva/sclera: Conjunctivae normal.     Pupils: Pupils are equal, round, and reactive to light.  Neck:     Musculoskeletal: Normal range of motion and neck supple.  Cardiovascular:     Rate and Rhythm: Normal rate and regular rhythm.  Pulmonary:     Effort: Pulmonary effort is normal. No respiratory distress.     Breath sounds: Normal breath sounds. No wheezing.  Abdominal:     General: There is no distension.     Palpations: Abdomen is soft.     Tenderness: There is no abdominal tenderness.  Musculoskeletal: Normal range of motion.        General: Tenderness present.     Comments: TTP of L low back and flank. No TTP of midline spine. Strength of lower extremities intact bilaterally. Sensation intact bilaterally. No saddle paresthesias.    Skin:    General: Skin is warm and dry.     Capillary Refill: Capillary refill takes less than 2 seconds.  Neurological:     Mental Status: She is alert and oriented to person, place, and time.      ED Treatments / Results  Labs (all labs ordered are listed, but only abnormal results are displayed) Labs Reviewed  URINALYSIS, ROUTINE W REFLEX MICROSCOPIC    EKG None  Radiology No results found.  Procedures Procedures (including critical care time)  Medications Ordered in ED Medications  ketorolac (TORADOL) 15  MG/ML injection 15 mg (15 mg Intravenous Given 05/03/18 0534)  methocarbamol (ROBAXIN) tablet 500 mg (500 mg Oral Given 05/03/18 0534)     Initial Impression / Assessment and Plan / ED Course  I have reviewed the triage vital signs and the nursing notes.  Pertinent labs & imaging results that were available during my care of the patient were reviewed by me and considered in my medical decision making (see chart for details).     Patient presenting for evaluation of low back pain.  Physical exam reassuring, neurovascularly intact.  No red flags for back pain.  Pain is reproducible with palpation of the musculature.  Likely musculoskeletal.  Doubt fracture, I do not believe x-rays will be beneficial.  Doubt vertebral injury, infection, spinal cord compression, myelopathy, or cauda equina syndrome. However, as pain extends to flank, will order UA to r/u UTI and kidney stone.  UA negative for blood or infection. Will treat symptomatically with NSAIDs, muscle relaxers, muscle creams.  Patient given information to follow-up with primary care.  At this time, patient appears safe for discharge.  Return precautions given.  Patient states she understands agrees plan.   Final Clinical Impressions(s) / ED Diagnoses   Final diagnoses:  Acute left-sided low back pain without sciatica    ED Discharge Orders         Ordered    naproxen (NAPROSYN) 500 MG tablet  2 times daily with meals     05/03/18 0534    methocarbamol (ROBAXIN) 500 MG tablet  At bedtime PRN     05/03/18 0534           Franchot Heidelberg, PA-C 05/03/18 0745    Horton, Barbette Hair, MD 05/03/18 2350

## 2018-06-28 ENCOUNTER — Encounter (HOSPITAL_COMMUNITY): Payer: Self-pay | Admitting: Radiology

## 2018-06-28 ENCOUNTER — Emergency Department (HOSPITAL_COMMUNITY)
Admission: EM | Admit: 2018-06-28 | Discharge: 2018-06-28 | Disposition: A | Discharge status: Home / Self Care | Payer: Medicaid Other | Attending: Emergency Medicine | Admitting: Emergency Medicine

## 2018-06-28 ENCOUNTER — Emergency Department (HOSPITAL_COMMUNITY): Payer: Medicaid Other

## 2018-06-28 DIAGNOSIS — Y998 Other external cause status: Secondary | ICD-10-CM | POA: Insufficient documentation

## 2018-06-28 DIAGNOSIS — Y939 Activity, unspecified: Secondary | ICD-10-CM | POA: Insufficient documentation

## 2018-06-28 DIAGNOSIS — Z79899 Other long term (current) drug therapy: Secondary | ICD-10-CM | POA: Diagnosis not present

## 2018-06-28 DIAGNOSIS — S3992XA Unspecified injury of lower back, initial encounter: Secondary | ICD-10-CM | POA: Diagnosis present

## 2018-06-28 DIAGNOSIS — S39012A Strain of muscle, fascia and tendon of lower back, initial encounter: Secondary | ICD-10-CM | POA: Diagnosis not present

## 2018-06-28 DIAGNOSIS — I10 Essential (primary) hypertension: Secondary | ICD-10-CM | POA: Insufficient documentation

## 2018-06-28 DIAGNOSIS — F17210 Nicotine dependence, cigarettes, uncomplicated: Secondary | ICD-10-CM | POA: Insufficient documentation

## 2018-06-28 DIAGNOSIS — Y929 Unspecified place or not applicable: Secondary | ICD-10-CM | POA: Diagnosis not present

## 2018-06-28 DIAGNOSIS — R1032 Left lower quadrant pain: Secondary | ICD-10-CM | POA: Insufficient documentation

## 2018-06-28 DIAGNOSIS — Y33XXXA Other specified events, undetermined intent, initial encounter: Secondary | ICD-10-CM | POA: Insufficient documentation

## 2018-06-28 LAB — COMPREHENSIVE METABOLIC PANEL
ALT: 13 U/L (ref 0–44)
AST: 20 U/L (ref 15–41)
Albumin: 3.9 g/dL (ref 3.5–5.0)
Alkaline Phosphatase: 58 U/L (ref 38–126)
Anion gap: 15 (ref 5–15)
BUN: 6 mg/dL (ref 6–20)
CO2: 22 mmol/L (ref 22–32)
CREATININE: 0.73 mg/dL (ref 0.44–1.00)
Calcium: 9.8 mg/dL (ref 8.9–10.3)
Chloride: 101 mmol/L (ref 98–111)
GFR calc Af Amer: >60 mL/min (ref >60)
GFR calc non Af Amer: >60 mL/min (ref >60)
Glucose, Bld: 99 mg/dL (ref 70–99)
Potassium: 3.3 mmol/L — ABNORMAL LOW (ref 3.5–5.1)
Sodium: 138 mmol/L (ref 135–145)
TOTAL PROTEIN: 7.8 g/dL (ref 6.5–8.1)
Total Bilirubin: 0.9 mg/dL (ref 0.3–1.2)

## 2018-06-28 LAB — CBC WITH DIFFERENTIAL/PLATELET
Abs Immature Granulocytes: 0.03 K/uL (ref 0.00–0.07)
Basophils Absolute: 0 K/uL (ref 0.0–0.1)
Basophils Relative: 0 %
Eosinophils Absolute: 0 K/uL (ref 0.0–0.5)
Eosinophils Relative: 0 %
HCT: 39 % (ref 36.0–46.0)
Hemoglobin: 13.6 g/dL (ref 12.0–15.0)
Immature Granulocytes: 0 %
Lymphocytes Relative: 18 %
Lymphs Abs: 1.8 K/uL (ref 0.7–4.0)
MCH: 33.9 pg (ref 26.0–34.0)
MCHC: 34.9 g/dL (ref 30.0–36.0)
MCV: 97.3 fL (ref 80.0–100.0)
Monocytes Absolute: 0.5 K/uL (ref 0.1–1.0)
Monocytes Relative: 5 %
Neutro Abs: 7.2 K/uL (ref 1.7–7.7)
Neutrophils Relative %: 77 %
Platelets: 198 K/uL (ref 150–400)
RBC: 4.01 MIL/uL (ref 3.87–5.11)
RDW: 12.8 % (ref 11.5–15.5)
WBC: 9.5 K/uL (ref 4.0–10.5)
nRBC: 0 % (ref 0.0–0.2)

## 2018-06-28 LAB — URINALYSIS, ROUTINE W REFLEX MICROSCOPIC
Bilirubin Urine: NEGATIVE
Glucose, UA: NEGATIVE mg/dL
Hgb urine dipstick: NEGATIVE
Ketones, ur: NEGATIVE mg/dL
Leukocytes,Ua: NEGATIVE
Nitrite: NEGATIVE
PH: 8 (ref 5.0–8.0)
Protein, ur: NEGATIVE mg/dL
Specific Gravity, Urine: 1.006 (ref 1.005–1.030)

## 2018-06-28 LAB — LIPASE, BLOOD: Lipase: 19 U/L (ref 11–51)

## 2018-06-28 MED ORDER — SODIUM CHLORIDE 0.9 % IV BOLUS
1000.0000 mL | Freq: Once | INTRAVENOUS | Status: AC
Start: 2018-06-28 — End: 2018-06-28
  Administered 2018-06-28: 1000 mL via INTRAVENOUS

## 2018-06-28 MED ORDER — MORPHINE SULFATE (PF) 4 MG/ML IV SOLN
4.0000 mg | Freq: Once | INTRAVENOUS | Status: AC
  Administered 2018-06-28: 4 mg via INTRAVENOUS
  Filled 2018-06-28: qty 1

## 2018-06-28 MED ORDER — IOHEXOL 300 MG/ML  SOLN
100.0000 mL | Freq: Once | INTRAMUSCULAR | Status: AC | PRN
  Administered 2018-06-28: 100 mL via INTRAVENOUS

## 2018-06-28 MED ORDER — CYCLOBENZAPRINE HCL 10 MG PO TABS: 10.0000 mg | ORAL_TABLET | Freq: Two times a day (BID) | ORAL | 0 refills | Status: DC | PRN

## 2018-06-28 MED ORDER — ONDANSETRON HCL 4 MG/2ML IJ SOLN
4.0000 mg | Freq: Once | INTRAMUSCULAR | Status: AC
  Administered 2018-06-28: 4 mg via INTRAVENOUS
  Filled 2018-06-28: qty 2

## 2018-06-28 NOTE — Discharge Instructions (Addendum)
Apply warm compresses to your left lower back for 20 minutes at a time. Take Aleve as directed for 10 days. Take Flexeril as needed as prescribed, do not drive or operate machinery while taking Flexeril. Follow up with your doctor for recheck, return to ER for new or worsening symptoms.

## 2018-06-28 NOTE — ED Provider Notes (Signed)
[place] EMERGENCY DEPARTMENT Provider Note   CSN: [id number] Arrival date & time: 06/28/18  1230     History   Chief Complaint Chief Complaint  Patient presents with  . Back Pain    HPI [name] is a 58 y.o. female.  58 year old female presents with complaint of pain in her left lower back which radiates to her left lower quadrant associated with nonbloody diarrhea.  Patient states pain started yesterday, denies falls or injuries.  Patient reports some discomfort on her right lower back as well.  Pain is worse with movement. Patient denies nausea, vomiting, fevers, chills, changes in bladder habits.  Patient took Tylenol without relief of her pain.  Denies loss of bowel or bladder control or groin numbness.  No other complaints or concerns.  Prior abdominal surgeries include tubal ligation.     Past Medical History:  Diagnosis Date  . Bipolar 1 disorder ([place])   . Depression   . Hypertension   . Mental disorder   . Schizophrenia [facility])     Patient Active Problem List   Diagnosis Date Noted  . Schizoaffective disorder, unspecified condition 01/08/2013  . Severe bipolar disorder with psychotic features ([place]) 01/07/2013  . ALCOHOLISM 11/04/2009  . DEPRESSION, RECURRENT 11/04/2009  . ESSENTIAL HYPERTENSION, BENIGN 11/04/2009  . ABDOMINAL PAIN, GENERALIZED 11/04/2009    Past Surgical History:  Procedure Laterality Date  . TUBAL LIGATION       OB History   No obstetric history on file.      Home Medications    Prior to Admission medications   Medication Sig Start Date End Date Taking? Authorizing Provider  benztropine (COGENTIN) 0.5 MG tablet Take 1 tablet (0.5 mg total) by mouth at bedtime. 01/12/13   Niel Hummer, NP  cyclobenzaprine (FLEXERIL) 10 MG tablet Take 1 tablet (10 mg total) by mouth 2 (two) times daily as needed for muscle spasms. 06/28/18   Tacy Learn, PA-C  diclofenac (VOLTAREN) 75 MG EC tablet Take 1 tablet (75 mg  total) by mouth 2 (two) times daily. 09/04/16   Barnet Glasgow, NP  FLUoxetine (PROZAC) 10 MG capsule Take 3 capsules (30 mg total) by mouth daily. 01/12/13   Niel Hummer, NP  fluPHENAZine (PROLIXIN) 5 MG tablet Take 1 tablet (5 mg total) by mouth at bedtime. 01/12/13   Niel Hummer, NP  hydrochlorothiazide (HYDRODIURIL) 25 MG tablet Take 1 tablet (25 mg total) by mouth daily. 01/12/13   Niel Hummer, NP  HYDROcodone-acetaminophen (NORCO/VICODIN) 5-325 MG per tablet Take 2 tablets by mouth every 4 (four) hours as needed. 02/02/15   Patel-Mills, Orvil Feil, PA-C  lisinopril (PRINIVIL,ZESTRIL) 5 MG tablet Take 1 tablet (5 mg total) by mouth daily. 01/12/13   Niel Hummer, NP  traZODone (DESYREL) 50 MG tablet Take 1 tablet (50 mg total) by mouth at bedtime as needed and may repeat dose one time if needed for sleep. 01/12/13   Niel Hummer, NP    Family History Family History  Problem Relation Age of Onset  . Breast cancer Maternal Aunt     Social History Social History   Tobacco Use  . Smoking status: Current Every Day Smoker    Packs/day: 0.50    Years: 38.00    Pack years: 19.00    Types: Cigarettes  . Smokeless tobacco: Never Used  Substance Use Topics  . Alcohol use: Yes    Alcohol/week: 4.0 standard drinks    Types: 4 Cans of  beer per week    Comment: every other day  . Drug use: Yes    Types: Marijuana    Comment: THC about once per week     Allergies   Penicillins   Review of Systems Review of Systems  Constitutional: Negative for appetite change, chills and fever.  Respiratory: Negative for shortness of breath.   Cardiovascular: Negative for chest pain.  Gastrointestinal: Positive for abdominal pain and diarrhea. Negative for blood in stool, constipation, nausea and vomiting.  Genitourinary: Negative for dysuria, frequency, urgency and vaginal discharge.  Musculoskeletal: Positive for back pain. Negative for arthralgias and myalgias.  Skin: Negative for rash and  wound.  Neurological: Negative for weakness.  Hematological: Does not bruise/bleed easily.  Psychiatric/Behavioral: Negative for confusion.  All other systems reviewed and are negative.    Physical Exam Updated Vital Signs BP (!) 141/101 (BP Location: Right Arm)   Pulse 92   Temp 97.8 F (36.6 C) (Oral)   Resp 20   SpO2 100%   Physical Exam Vitals signs and nursing note reviewed.  Constitutional:      General: She is not in acute distress.    Appearance: She is well-developed. She is not diaphoretic.     Comments: Appears to be in pain  HENT:     Head: Normocephalic and atraumatic.  Cardiovascular:     Rate and Rhythm: Normal rate and regular rhythm.     Pulses: Normal pulses.     Heart sounds: Normal heart sounds. No murmur.  Pulmonary:     Effort: Pulmonary effort is normal.     Breath sounds: Normal breath sounds.  Abdominal:     Tenderness: There is abdominal tenderness in the left upper quadrant and left lower quadrant. There is no right CVA tenderness or left CVA tenderness.  Musculoskeletal:        General: Tenderness present.     Lumbar back: She exhibits tenderness. She exhibits no bony tenderness.       Back:  Skin:    General: Skin is warm and dry.     Findings: No erythema or rash.  Neurological:     Mental Status: She is alert and oriented to person, place, and time.  Psychiatric:        Behavior: Behavior normal.      ED Treatments / Results  Labs (all labs ordered are listed, but only abnormal results are displayed) Labs Reviewed  COMPREHENSIVE METABOLIC PANEL - Abnormal; Notable for the following components:      Result Value   Potassium 3.3 (*)    All other components within normal limits  CBC WITH DIFFERENTIAL/PLATELET  LIPASE, BLOOD  URINALYSIS, ROUTINE W REFLEX MICROSCOPIC    EKG None  Radiology Ct Abdomen Pelvis W Contrast  Result Date: 06/28/2018 CLINICAL DATA:  Left-sided back and abdominal pain with nausea and vomiting.  EXAM: CT ABDOMEN AND PELVIS WITH CONTRAST TECHNIQUE: Multidetector CT imaging of the abdomen and pelvis was performed using the standard protocol following bolus administration of intravenous contrast. CONTRAST:  146mL OMNIPAQUE IOHEXOL 300 MG/ML  SOLN COMPARISON:  Abdominopelvic CT 11/04/2009. FINDINGS: Lower chest: Mild emphysematous changes at the lung bases with superimposed linear scarring or atelectasis. No significant pleural or pericardial effusion. Hepatobiliary: The liver is normal in density without suspicious focal abnormality. No evidence of gallstones, gallbladder wall thickening or biliary dilatation. Pancreas: Unremarkable. No pancreatic ductal dilatation or surrounding inflammatory changes. Spleen: Normal in size without focal abnormality. Adrenals/Urinary Tract: Both adrenal glands appear normal.  There are 2 small right renal cyst. The left kidney appears normal. No evidence of hydronephrosis or ureteral calculus. The bladder appears normal. Stomach/Bowel: No evidence of bowel wall thickening, distention or surrounding inflammatory change. The appendix is not clearly visualized. There is no pericecal inflammation. Mild sigmoid diverticulosis. Vascular/Lymphatic: There are no enlarged abdominal or pelvic lymph nodes. Aortic and branch vessel atherosclerosis. Reproductive: The uterus and ovaries appear normal. No adnexal mass. Other: Previously demonstrated ascites has resolved. No inflammatory changes are identified. Musculoskeletal: No acute or significant osseous findings. Mild lumbar facet disease. IMPRESSION: 1. No acute findings or explanation for the patient's symptoms. 2. Sigmoid diverticulosis without evidence of acute inflammation. 3. Previously demonstrated ascites has resolved. 4.  Aortic Atherosclerosis (ICD10-I70.0). Electronically Signed   By: Richardean Sale M.D.   On: 06/28/2018 15:07    Procedures Procedures (including critical care time)  Medications Ordered in  ED Medications  morphine 4 MG/ML injection 4 mg (4 mg Intravenous Given 06/28/18 1250)  ondansetron (ZOFRAN) injection 4 mg (4 mg Intravenous Given 06/28/18 1250)  sodium chloride 0.9 % bolus 1,000 mL (0 mLs Intravenous Stopped 06/28/18 1350)  iohexol (OMNIPAQUE) 300 MG/ML solution 100 mL (100 mLs Intravenous Contrast Given 06/28/18 1442)     Initial Impression / Assessment and Plan / ED Course  I have reviewed the triage vital signs and the nursing notes.  Pertinent labs & imaging results that were available during my care of the patient were reviewed by me and considered in my medical decision making (see chart for details).  Clinical Course as of Jun 28 1516  Sun Jun 28, 2018  1516 58 yo female with left lower back pain and abdominal pain with diarrhea. Pain reproduced with palpation of left lower back and SI area and well as left upper and lower abdominal tenderness. CT abdomen and pelvis without acute or significant findings, does show diverticulosis without diverticulitis. UA, lipase, CMP without significant findings. Pain has definitely improved with pain medication given in the emergency room.  Patient likely muscle strain in her low back versus sciatica and recommend warm compresses, anti-inflammatory, muscle relaxer.  Patient should recheck with her PCP and return to the ER for new or worsening symptoms.   [LM]    Clinical Course User Index [LM] Tacy Learn, PA-C   Final Clinical Impressions(s) / ED Diagnoses   Final diagnoses:  Strain of lumbar region, initial encounter  Left lower quadrant abdominal pain    ED Discharge Orders         Ordered    cyclobenzaprine (FLEXERIL) 10 MG tablet  2 times daily PRN     06/28/18 1514           Tacy Learn, PA-C 06/28/18 1518    Malvin Johns, MD 06/28/18 1534

## 2018-06-28 NOTE — ED Triage Notes (Signed)
Pt to ED for evaluation of L lower back pain starting yesterday. Denies injury. Has taken Tylenol without relief. Denies any urinary symptoms or incontinence. Endorses L sided abdominal pain and diarrhea this morning.

## 2018-10-26 ENCOUNTER — Other Ambulatory Visit: Payer: Self-pay | Admitting: Primary Care

## 2018-10-26 DIAGNOSIS — Z1231 Encounter for screening mammogram for malignant neoplasm of breast: Secondary | ICD-10-CM

## 2018-10-29 ENCOUNTER — Other Ambulatory Visit (HOSPITAL_COMMUNITY): Payer: Self-pay | Admitting: *Deleted

## 2018-10-29 DIAGNOSIS — Z1231 Encounter for screening mammogram for malignant neoplasm of breast: Secondary | ICD-10-CM

## 2018-12-24 ENCOUNTER — Ambulatory Visit: Payer: Self-pay

## 2018-12-24 ENCOUNTER — Other Ambulatory Visit: Payer: Self-pay

## 2018-12-24 ENCOUNTER — Encounter (HOSPITAL_COMMUNITY): Payer: Self-pay

## 2018-12-24 ENCOUNTER — Other Ambulatory Visit (HOSPITAL_COMMUNITY): Payer: Self-pay | Admitting: *Deleted

## 2018-12-24 ENCOUNTER — Ambulatory Visit (HOSPITAL_COMMUNITY)
Admission: RE | Admit: 2018-12-24 | Discharge: 2018-12-24 | Disposition: A | Discharge status: Home / Self Care | Payer: Medicaid Other | Source: Ambulatory Visit | Attending: Obstetrics and Gynecology | Admitting: Obstetrics and Gynecology

## 2018-12-24 DIAGNOSIS — Z1239 Encounter for other screening for malignant neoplasm of breast: Secondary | ICD-10-CM | POA: Insufficient documentation

## 2018-12-24 DIAGNOSIS — N6452 Nipple discharge: Secondary | ICD-10-CM | POA: Insufficient documentation

## 2018-12-24 NOTE — Progress Notes (Signed)
Complaints of right clear breast discharge x 2 months when expressed. Complaints of right breast pain x 2-3 months that comes and goes. Patient rates the pain at a 4 out of 10.  Pap Smear: Pap smear not completed today. Last Pap smear was in October 2018 at [facility] and normal per patient. Per patient has no history of an abnormal Pap smear. Last Pap smear result is not in Epic. Previous Pap smear result from 05/31/2009 is in [place].  Physical exam: Breasts Breasts symmetrical. No skin abnormalities bilateral breasts. No nipple retraction bilateral breasts. No nipple discharge left breast. Expressed a scant amount of clear discharge from the right breast on exam. Unable to express enough discharge to send sample to Cytology for evaluation. No lymphadenopathy. No lumps palpated bilateral breasts. No complaints of pain or tenderness on exam. Referred patient to the [place] [facility] for a diagnostic mammogram and right breast ultrasound. Appointment scheduled for Friday, January 01, 2019 at 1340.        Pelvic/Bimanual No Pap smear completed today since last Pap smear was in October 2018 per patient. Pap smear not indicated per BCCCP guidelines.   Smoking History: Patient is a current smoker. Discussed smoking cessation with patient. Referred to the [facility] Quitline and gave resources to the free smoking cessation classes at [facility].  Patient Navigation: Patient education provided. Access to services provided for patient through [place] program.   Colorectal Cancer Screening: Per patient has never had a colonoscopy completed. No complaints today.   Breast and Cervical Cancer Risk Assessment: Patient has a family history of three maternal aunts and a cousin having breast cancer. Patient has no known genetic mutations or history of radiation treatment to the chest before age 55. Patient has no history of cervical dysplasia, immunocompromised, or DES exposure in-utero.  Risk Assessment    Risk Scores      12/24/2018   Last edited by: Armond Hang, LPN   5-year risk: 1.4 %   Lifetime risk: 7.3 %

## 2018-12-24 NOTE — Patient Instructions (Signed)
Explained breast self awareness with Daphine Deutscher. Patient did not need a Pap smear today due to last Pap smear was in October 2018 per patient. Let her know BCCCP will cover Pap smears every 3 years unless has a history of abnormal Pap smears. Referred patient to the [facility] for a diagnostic mammogram and right breast ultrasound. Appointment scheduled for Friday, January 01, 2019 at 1340. Patient aware of appointment and will be there.  Discussed smoking cessation with patient. Referred to the [facility] Quitline and gave resources to the free smoking cessation classes at [facility]. [place] verbalized understanding.  [place], Arvil Chaco, RN 3:40 PM

## 2018-12-30 ENCOUNTER — Encounter (HOSPITAL_COMMUNITY): Payer: Self-pay | Admitting: *Deleted

## 2019-01-01 ENCOUNTER — Other Ambulatory Visit: Payer: Medicaid Other

## 2019-01-04 ENCOUNTER — Telehealth: Payer: Self-pay

## 2019-01-04 ENCOUNTER — Ambulatory Visit: Payer: Medicaid Other

## 2019-01-04 NOTE — Telephone Encounter (Signed)
Returned patient's phone regarding rescheduling [facility] appointment. Let name and number for patient to call back if she wants to reschedule missed appointment.

## 2019-01-12 ENCOUNTER — Other Ambulatory Visit: Payer: Self-pay

## 2019-01-12 ENCOUNTER — Ambulatory Visit
Admission: RE | Admit: 2019-01-12 | Discharge: 2019-01-12 | Disposition: A | Discharge status: Home / Self Care | Payer: Medicaid Other | Source: Ambulatory Visit | Attending: Obstetrics and Gynecology | Admitting: Obstetrics and Gynecology

## 2019-01-12 DIAGNOSIS — N6452 Nipple discharge: Secondary | ICD-10-CM

## 2019-01-25 ENCOUNTER — Ambulatory Visit: Payer: Medicaid Other

## 2019-01-27 ENCOUNTER — Ambulatory Visit: Payer: Medicaid Other

## 2019-02-22 ENCOUNTER — Other Ambulatory Visit: Payer: Self-pay | Admitting: Acute Care

## 2019-02-22 ENCOUNTER — Other Ambulatory Visit: Payer: Self-pay

## 2019-02-22 DIAGNOSIS — Z122 Encounter for screening for malignant neoplasm of respiratory organs: Secondary | ICD-10-CM

## 2019-02-22 DIAGNOSIS — F17210 Nicotine dependence, cigarettes, uncomplicated: Secondary | ICD-10-CM

## 2019-02-22 NOTE — Addendum Note (Signed)
Addended by: Doroteo Glassman D on: 02/22/2019 05:42 PM   Modules accepted: Orders

## 2019-02-22 NOTE — Progress Notes (Signed)
Shared Decision-Making Visit for Lung Cancer Screening [phone] )  Lung Cancer Screening Criteria 35-89-years of age 59+ pack year smoking history No Recent History of coughing up blood   No Unexplained weight loss of > 15 pounds in the last 6 months. No Prior History Lung / other cancer  (Diagnosis within the last 5 years already requiring surveillance chest CT Scans). Pt is a current smoker, or former smoker who has quit within the last 15 years.  This patient meets the criterial noted above and is asymptomatic for any signs or symptoms of lung cancer.  The Shared Decision-Making Visit discussion included risks and benefits of screening, potential for follow up diagnostic testing for abnormal scans, potential for false positive tests, over diagnosis, and discussion about total radiation exposure. Patient stated willingness to undergo diagnostics and treatment as needed. Current smokers were counseled on smoking cessation as the single most powerful action they can take to decrease their risk of lung cancer, pulmonary disease, heart disease and stroke. They were given a resource card with information on receiving free nicotine replacement therapy , and information about free smoking cessation classes.  Pt understands that this scan is being paid for by a grant obtained by the [facility]. We discussed  that there is no guarantee of additional grant money from year to year as these grants are not guaranteed to programs on an annual basis.They have to be applied for and they are awarded based on county need, and utilization of the previous years funds.Shaune Leeks # 1 Ticket to ride the scanner provided Be stronger than your excuses card provided  Magdalen Spatz, MSN, AGACNP-BC [place] Pager # [phone] After 4 pm please call [phone] 02/22/2019

## 2019-02-22 NOTE — Addendum Note (Signed)
Addended by: Eric Form F on: 02/22/2019 06:14 PM   Modules accepted: Level of Service

## 2019-03-02 ENCOUNTER — Ambulatory Visit
Admission: RE | Admit: 2019-03-02 | Discharge: 2019-03-02 | Disposition: A | Discharge status: Home / Self Care | Payer: No Typology Code available for payment source | Source: Ambulatory Visit | Attending: Acute Care | Admitting: Acute Care

## 2019-03-02 DIAGNOSIS — Z122 Encounter for screening for malignant neoplasm of respiratory organs: Secondary | ICD-10-CM

## 2019-03-02 DIAGNOSIS — F17210 Nicotine dependence, cigarettes, uncomplicated: Secondary | ICD-10-CM

## 2019-03-04 ENCOUNTER — Telehealth: Payer: Self-pay | Admitting: Acute Care

## 2019-03-04 NOTE — Telephone Encounter (Signed)
Triage, Can you call the patient and see if you can get her on my schedule for Monday morning as a televisit  To discuss the results of her CT scan? Thanks so much. [name] called yesterday and she left a message. She is with [name] today and they have a busy schedule. I would like to speak with her Monday if at all possible. Thanks so much.

## 2019-03-04 NOTE — Telephone Encounter (Signed)
Called and spoke to pt. Appt made with SG on 10/26 at 1030 as a televisit. Pt verbalized understanding and denied any further questions or concerns at this time.

## 2019-03-08 ENCOUNTER — Ambulatory Visit (INDEPENDENT_AMBULATORY_CARE_PROVIDER_SITE_OTHER): Payer: Medicaid Other | Admitting: Acute Care

## 2019-03-08 ENCOUNTER — Other Ambulatory Visit: Payer: Self-pay

## 2019-03-08 ENCOUNTER — Encounter: Payer: Self-pay | Admitting: Acute Care

## 2019-03-08 DIAGNOSIS — Z122 Encounter for screening for malignant neoplasm of respiratory organs: Secondary | ICD-10-CM

## 2019-03-08 DIAGNOSIS — J441 Chronic obstructive pulmonary disease with (acute) exacerbation: Secondary | ICD-10-CM | POA: Diagnosis not present

## 2019-03-08 DIAGNOSIS — F17210 Nicotine dependence, cigarettes, uncomplicated: Secondary | ICD-10-CM

## 2019-03-08 MED ORDER — DOXYCYCLINE HYCLATE 100 MG PO TABS: 100.0000 mg | ORAL_TABLET | Freq: Two times a day (BID) | ORAL | 0 refills | Status: DC

## 2019-03-08 NOTE — Progress Notes (Signed)
Virtual Visit via Telephone Note  I connected with [name] on 03/08/19 at 10:30 AM EDT by telephone and verified that I am speaking with the correct person using two identifiers.  Location: Patient: At home Provider: At the office at [address] [place], [address]   I discussed the limitations, risks, security and privacy concerns of performing an evaluation and management service by telephone and the availability of in person appointments. I also discussed with the patient that there may be a patient responsible charge related to this service. The patient expressed understanding and agreed to proceed.   History of Present Illness: Pt. Was seen through the [facility] clinic for Lung Cancer. Her low dose CT was read as a LR 0, incomplete . She had an infection in her RUL that needed treatment before screening CT could be properly evaluated.  . She states she was asymptomatic, and that she had not had any fever or worsening of her baseline status. She agreed with treatment and follow up before a 3 month follow up LDCT. She denies chest pain, orthopnea or hemoptysis. She will have a follow up scan through the grant program    Observations/Objective: Low Dose Lung Cancer Screening 03/02/2019 Lung RADS 0 Additional lung cancer screening CT images/or comparison to prior chest CT examinations is needed.  Focal patchy opacity in the medial right upper lobe, favoring infection. Repeat low-dose lung cancer screening CT chest is suggested in 3 months after appropriate antimicrobial therapy.  Scattered multi cystic lucency throughout the lungs, favoring areas of fibrosis, likely related to post infectious/inflammatory scarring. Cystic lung disease is considered less likely.  Emphysema (ICD10-J43.9).  Assessment and Plan: We will call in Doxycycline 100 mg BID x 7 days. Take with a full glass of water Follow up Lung Screening CT scan in 3 months ( Near the end of January  2021) Someone will call you to get this set up. Follow up call prior to next scan being completed.  Follow up call with you next week to ensure you have done ok with the antibiotics.  Denise please let Altha Harm know  Patient needs a 3 month follow up Scan ( Will it be paid for by the grant?)  Follow Up Instructions: Follow up LDCT 05/2019   I discussed the assessment and treatment plan with the patient. The patient was provided an opportunity to ask questions and all were answered. The patient agreed with the plan and demonstrated an understanding of the instructions.   The patient was advised to call back or seek an in-person evaluation if the symptoms worsen or if the condition fails to improve as anticipated.  I provided 23  minutes of non-face-to-face time during this encounter.   Magdalen Spatz, NP 03/08/2019 5:27 PM

## 2019-03-08 NOTE — Patient Instructions (Addendum)
It was good to talk with you today. Your Low Dose CT could not be read due to infection in your lungs. We will treat the infection with an antibiotic.  We have sent on a prescription for Doxycycline 100 mg two times daily for 7 days ( Once in the morning and once in the evening) Please take until gone Take with a full glass of water We will reschedule a follow up CT Chest ( Low Dose CT) in 3 months ( January 2021) Someone will call you closer to the time to get it scheduled.  Please contact office for sooner follow up if symptoms do not improve or worsen or seek emergency care  Our office number is [phone] We will do a follow up with one of our pulmonary MD's after the scan in 05/2019.

## 2019-04-20 ENCOUNTER — Other Ambulatory Visit: Payer: Self-pay

## 2019-04-20 ENCOUNTER — Ambulatory Visit: Payer: Medicaid Other | Attending: Nurse Practitioner | Admitting: Nurse Practitioner

## 2019-04-20 ENCOUNTER — Encounter: Payer: Self-pay | Admitting: Nurse Practitioner

## 2019-04-20 VITALS — Ht 64.0 in

## 2019-04-20 DIAGNOSIS — E782 Mixed hyperlipidemia: Secondary | ICD-10-CM

## 2019-04-20 DIAGNOSIS — Z131 Encounter for screening for diabetes mellitus: Secondary | ICD-10-CM

## 2019-04-20 DIAGNOSIS — Z130 Encounter for screening for diseases of the blood and blood-forming organs and certain disorders involving the immune mechanism: Secondary | ICD-10-CM | POA: Diagnosis not present

## 2019-04-20 DIAGNOSIS — F17210 Nicotine dependence, cigarettes, uncomplicated: Secondary | ICD-10-CM | POA: Diagnosis not present

## 2019-04-20 DIAGNOSIS — Z1211 Encounter for screening for malignant neoplasm of colon: Secondary | ICD-10-CM | POA: Diagnosis not present

## 2019-04-20 DIAGNOSIS — I10 Essential (primary) hypertension: Secondary | ICD-10-CM | POA: Diagnosis not present

## 2019-04-20 DIAGNOSIS — Z79899 Other long term (current) drug therapy: Secondary | ICD-10-CM | POA: Insufficient documentation

## 2019-04-20 MED ORDER — AMLODIPINE BESYLATE 5 MG PO TABS: 5.0000 mg | ORAL_TABLET | Freq: Every day | ORAL | 3 refills | Status: DC

## 2019-04-20 MED ORDER — BLOOD PRESSURE MONITOR DEVI: 0 refills | Status: AC

## 2019-04-20 NOTE — Progress Notes (Signed)
Virtual Visit via Telephone Note Due to national recommendations of social distancing due to [place] 19, telehealth visit is felt to be most appropriate for this patient at this time.  I discussed the limitations, risks, security and privacy concerns of performing an evaluation and management service by telephone and the availability of in person appointments. I also discussed with the patient that there may be a patient responsible charge related to this service. The patient expressed understanding and agreed to proceed.    I connected with [name] on 04/20/19  at   1:50 PM EST  EDT by telephone and verified that I am speaking with the correct person using two identifiers.   Consent I discussed the limitations, risks, security and privacy concerns of performing an evaluation and management service by telephone and the availability of in person appointments. I also discussed with the patient that there may be a patient responsible charge related to this service. The patient expressed understanding and agreed to proceed.   Location of Patient: Private residence   Location of Provider: [place] participating in Telemedicine visit: [name]    History of Present Illness: Telemedicine visit for: Establish Care  She had abnormal CT of the chest on March 02, 2019. Focal patchy opacity in the medial right upper lobe, favoring infection. Repeat low-dose lung cancer screening CT chest is suggested in 3 months after appropriate antimicrobial therapy. Repeat chest CT has been ordered for January 2021.  Essential Hypertension She stopped taking blood pressure several years ago due to cost. She does not monitor her blood pressure at home.  Previous antihypertensives include hydrochlorothiazide 25 mg daily and lisinopril 5 mg daily.  As she is not taking either 1 of these medications I will start her on  amlodipine 5 mg.  She will return to the office for Pap smear and blood pressure check in 3 to 4 weeks. Denies chest pain, shortness of breath, palpitations, lightheadedness, dizziness, headaches or BLE edema.  She does currently smoke up to a pack a day. BP Readings from Last 3 Encounters:  12/24/18 110/74  06/28/18 (!) 146/112  05/03/18 (!) 158/102     Depression She was taking several antipsychotics in the past but states for her understanding she was only being treated for depression.  She was [facility] to behavioral [facility] approximately 6 years ago for schizoaffective disorder, EtOH abuse and depression.  At that time she reported a previous history of schizophrenia diagnosed in 2001 as well as alcohol dependence and marijuana abuse.  She was hospitalized for 4 days and discharged home in stable condition with instructions to take Cogentin, Prozac, Prolixin and trazodone.  Currently she is not taking any of these medications and reports she has not taken any medication for her mood and a few years.  She declines a referral today to [facility] or our collaborative in-house psychiatric program.  She currently denies any thoughts of self-harm, insomnia, or worsening depression.    Past Medical History:  Diagnosis Date  . Bipolar 1 disorder ([place])   . Depression   . Hypertension   . Mental disorder   . Schizophrenia [facility])     Past Surgical History:  Procedure Laterality Date  . TUBAL LIGATION      Family History  Problem Relation Age of Onset  . Breast cancer Maternal Aunt   . Breast cancer Maternal Aunt   . Breast cancer Maternal  Aunt   . Diabetes Mother     Social History   Socioeconomic History  . Marital status: Divorced    Spouse name: Not on file  . Number of children: 3  . Years of education: Not on file  . Highest education level: 9th grade  Occupational History  . Not on file  Social Needs  . Financial resource strain: Not on file  . Food insecurity    Worry:  Not on file    Inability: Not on file  . Transportation needs    Medical: No    Non-medical: No  Tobacco Use  . Smoking status: Current Every Day Smoker    Packs/day: 0.75    Years: 42.00    Pack years: 31.50    Types: Cigarettes  . Smokeless tobacco: Never Used  Substance and Sexual Activity  . Alcohol use: Yes    Alcohol/week: 4.0 standard drinks    Types: 4 Cans of beer per week    Comment: every other day  . Drug use: Yes    Frequency: 2.0 times per week    Types: Marijuana    Comment: THC about once per week  . Sexual activity: Yes    Birth control/protection: Surgical  Lifestyle  . Physical activity    Days per week: Not on file    Minutes per session: Not on file  . Stress: Not on file  Relationships  . Social Herbalist on phone: Not on file    Gets together: Not on file    Attends religious service: Not on file    Active member of club or organization: Not on file    Attends meetings of clubs or organizations: Not on file    Relationship status: Not on file  Other Topics Concern  . Not on file  Social History Narrative  . Not on file     Observations/Objective: Awake, alert and oriented x 3   Review of Systems  Constitutional: Negative for fever, malaise/fatigue and weight loss.  HENT: Negative.  Negative for nosebleeds.   Eyes: Negative.  Negative for blurred vision, double vision and photophobia.  Respiratory: Negative.  Negative for cough and shortness of breath.   Cardiovascular: Negative.  Negative for chest pain, palpitations and leg swelling.  Gastrointestinal: Negative.  Negative for heartburn, nausea and vomiting.  Musculoskeletal: Negative.  Negative for myalgias.  Neurological: Negative.  Negative for dizziness, focal weakness, seizures and headaches.  Psychiatric/Behavioral: Negative.  Negative for suicidal ideas.    Assessment and Plan: [name] was seen today for new patient (initial visit).  Diagnoses and all orders for this  visit:  Essential hypertension -     Blood Pressure Monitor DEVI; Please provide patient with insurance approved blood pressure monitor -     amLODipine (NORVASC) 5 MG tablet; Take 1 tablet (5 mg total) by mouth daily. -     CMP14+EGFR; Future Continue all antihypertensives as prescribed.  Remember to bring in your blood pressure log with you for your follow up appointment.  DASH/Mediterranean Diets are healthier choices for HTN.    Colon cancer screening -     Ambulatory referral to Gastroenterology  Mixed hyperlipidemia -     Lipid Panel; Future  Screening for deficiency anemia -     CBC; Future  Screening for diabetes mellitus (DM) -     A1c; Future     Follow Up Instructions Return in about 4 weeks (around 05/18/2019) for PAP SMEAR, BP recheck.  I discussed the assessment and treatment plan with the patient. The patient was provided an opportunity to ask questions and all were answered. The patient agreed with the plan and demonstrated an understanding of the instructions.   The patient was advised to call back or seek an in-person evaluation if the symptoms worsen or if the condition fails to improve as anticipated.  I provided 21 minutes of non-face-to-face time during this encounter including median intraservice time, reviewing previous notes, labs, imaging, medications and explaining diagnosis and management.  Gildardo Pounds, FNP-BC

## 2019-04-22 ENCOUNTER — Telehealth: Payer: Self-pay

## 2019-04-22 NOTE — Telephone Encounter (Signed)
CMA faxed over supplies to [facility]:  Blood pressure monitor

## 2019-04-29 ENCOUNTER — Other Ambulatory Visit: Payer: Medicaid Other

## 2019-05-05 ENCOUNTER — Other Ambulatory Visit: Payer: Self-pay

## 2019-05-05 ENCOUNTER — Ambulatory Visit: Payer: Medicaid Other | Attending: Nurse Practitioner

## 2019-05-05 DIAGNOSIS — Z131 Encounter for screening for diabetes mellitus: Secondary | ICD-10-CM

## 2019-05-05 DIAGNOSIS — Z130 Encounter for screening for diseases of the blood and blood-forming organs and certain disorders involving the immune mechanism: Secondary | ICD-10-CM

## 2019-05-05 DIAGNOSIS — E782 Mixed hyperlipidemia: Secondary | ICD-10-CM

## 2019-05-05 DIAGNOSIS — I10 Essential (primary) hypertension: Secondary | ICD-10-CM

## 2019-05-06 LAB — CMP14+EGFR
ALT: 50 IU/L — ABNORMAL HIGH (ref 0–32)
AST: 130 IU/L — ABNORMAL HIGH (ref 0–40)
Albumin/Globulin Ratio: 1.7 (ref 1.2–2.2)
Albumin: 5 g/dL — ABNORMAL HIGH (ref 3.8–4.9)
Alkaline Phosphatase: 82 IU/L (ref 39–117)
BUN/Creatinine Ratio: 9 (ref 9–23)
BUN: 6 mg/dL (ref 6–24)
Bilirubin Total: 0.4 mg/dL (ref 0.0–1.2)
CO2: 24 mmol/L (ref 20–29)
Calcium: 10.2 mg/dL (ref 8.7–10.2)
Chloride: 97 mmol/L (ref 96–106)
Creatinine, Ser: 0.67 mg/dL (ref 0.57–1.00)
GFR calc Af Amer: 111 mL/min/{1.73_m2} (ref >59)
GFR calc non Af Amer: 97 mL/min/{1.73_m2} (ref >59)
Globulin, Total: 3 g/dL (ref 1.5–4.5)
Glucose: 70 mg/dL (ref 65–99)
Potassium: 4.4 mmol/L (ref 3.5–5.2)
Sodium: 139 mmol/L (ref 134–144)
Total Protein: 8 g/dL (ref 6.0–8.5)

## 2019-05-06 LAB — CBC
Hematocrit: 38.9 % (ref 34.0–46.6)
Hemoglobin: 13.7 g/dL (ref 11.1–15.9)
MCH: 35 pg — ABNORMAL HIGH (ref 26.6–33.0)
MCHC: 35.2 g/dL (ref 31.5–35.7)
MCV: 100 fL — ABNORMAL HIGH (ref 79–97)
Platelets: 130 10*3/uL — ABNORMAL LOW (ref 150–450)
RBC: 3.91 x10E6/uL (ref 3.77–5.28)
RDW: 12.8 % (ref 11.7–15.4)
WBC: 4.5 10*3/uL (ref 3.4–10.8)

## 2019-05-06 LAB — LIPID PANEL
Chol/HDL Ratio: 1.6 ratio (ref 0.0–4.4)
Cholesterol, Total: 269 mg/dL — ABNORMAL HIGH (ref 100–199)
HDL: 173 mg/dL (ref >39)
LDL Chol Calc (NIH): 87 mg/dL (ref 0–99)
Triglycerides: 55 mg/dL (ref 0–149)
VLDL Cholesterol Cal: 9 mg/dL (ref 5–40)

## 2019-05-06 LAB — HEMOGLOBIN A1C
Est. average glucose Bld gHb Est-mCnc: 94 mg/dL
Hgb A1c MFr Bld: 4.9 % (ref 4.8–5.6)

## 2019-06-03 ENCOUNTER — Inpatient Hospital Stay: Admission: RE | Admit: 2019-06-03 | Payer: Medicaid Other | Source: Ambulatory Visit

## 2019-06-18 ENCOUNTER — Ambulatory Visit
Admission: RE | Admit: 2019-06-18 | Discharge: 2019-06-18 | Disposition: A | Discharge status: Home / Self Care | Payer: Medicaid Other | Source: Ambulatory Visit | Attending: Acute Care | Admitting: Acute Care

## 2019-06-18 DIAGNOSIS — Z122 Encounter for screening for malignant neoplasm of respiratory organs: Secondary | ICD-10-CM

## 2019-06-18 DIAGNOSIS — F17210 Nicotine dependence, cigarettes, uncomplicated: Secondary | ICD-10-CM

## 2019-06-22 ENCOUNTER — Telehealth: Payer: Self-pay | Admitting: Acute Care

## 2019-06-22 NOTE — Telephone Encounter (Signed)
I have attempted to call the patient. ( 2/8, and 2/9)  There was no answer . I left a message with the office contact number requesting that the patient call the office for results. If she does not return the call we will attempt to contact her again tomorrow.

## 2019-07-01 ENCOUNTER — Telehealth: Payer: Self-pay | Admitting: Acute Care

## 2019-07-01 NOTE — Telephone Encounter (Signed)
Denise, I need to call the patient and let her know her scan is abnormal. I have attempted to call her several times. I called again today, and left another message with the office contact number. Can you please send a certified letter to the patient letting her know she needs to call the office for the results of her scan? .  Dr. Lamonte Sakai reviewed the scan and said that he feels the patient needs an appointment with one of the pulmonary docs to plan follow up scanning. Can you please get her scheduled for a follow up appointment with either [place], [facility] or Tamala Julian, whoever has the earliest availability, so we can get her taken care ?  Thanks so much.

## 2019-07-02 NOTE — Telephone Encounter (Signed)
I have a new consult lung nodule slot open next week on 25th, AM or PM Thanks Garner Nash, DO [facility] 07/02/2019 9:33 AM

## 2019-07-05 NOTE — Telephone Encounter (Signed)
See telephone note 07/01/19.

## 2019-07-05 NOTE — Telephone Encounter (Signed)
Spoke with [name] and advised that she needs appt with our office to review CT results and decide on further f/u. [name] given appt with Dr Valeta Harms 07/08/19 2:30. I stressed to [name] that it is very important that she keep this appt. [name] verbalized understanding. Nothing further needed at this time.

## 2019-07-08 ENCOUNTER — Encounter: Payer: Self-pay | Admitting: Pulmonary Disease

## 2019-07-08 ENCOUNTER — Other Ambulatory Visit: Payer: Self-pay

## 2019-07-08 ENCOUNTER — Ambulatory Visit: Payer: Medicaid Other | Admitting: Pulmonary Disease

## 2019-07-08 VITALS — BP 120/74 | HR 106 | Temp 97.3°F | Ht 64.0 in | Wt 130.0 lb

## 2019-07-08 DIAGNOSIS — R918 Other nonspecific abnormal finding of lung field: Secondary | ICD-10-CM | POA: Diagnosis not present

## 2019-07-08 DIAGNOSIS — R634 Abnormal weight loss: Secondary | ICD-10-CM

## 2019-07-08 DIAGNOSIS — Z720 Tobacco use: Secondary | ICD-10-CM | POA: Diagnosis not present

## 2019-07-08 DIAGNOSIS — R911 Solitary pulmonary nodule: Secondary | ICD-10-CM

## 2019-07-08 MED ORDER — ANORO ELLIPTA 62.5-25 MCG/INH IN AEPB: 1.0000 | INHALATION_SPRAY | Freq: Every day | RESPIRATORY_TRACT | 0 refills | Status: DC

## 2019-07-08 NOTE — Progress Notes (Signed)
I have attempted to call the patient again today. I have left a message on her voice mail to have her call the office at [phone] so that we can discuss the results of her scan. I will await her return call.

## 2019-07-08 NOTE — Progress Notes (Signed)
Synopsis: Referred in Feb 2021 for abnormal lung cancer screening, PCP: Gildardo Pounds, NP  Subjective:   PATIENT ID: [name] GENDER: female DOB: 08/04/1960, MRN: [id number]  Chief Complaint  Patient presents with  . Consult    Lung Nodule referred by Dr. Raul Del . Having some SOB.    59 year old female past medical history of hypertension, type I bipolar disease, Labeled with schizophrenia, family history of breast cancer.  Patient has a longstanding history of smoking.  She also has pretty regular daily alcohol use.  She has spoke since she was a teenager.  She was enrolled in our lung cancer screening program in October 2020.  This showed a right upper lobe peripheral lesion.  This unfortunately was repeat imaged here in February which showed enlargement/persistence of the lesion.  Patient was referred for evaluation of abnormality of within the lung cancer screening CT.    Past Medical History:  Diagnosis Date  . Bipolar 1 disorder ([place])   . Depression   . Hypertension   . Mental disorder   . Schizophrenia ([place])      Family History  Problem Relation Age of Onset  . Breast cancer Maternal Aunt   . Breast cancer Maternal Aunt   . Breast cancer Maternal Aunt   . Diabetes Mother      Past Surgical History:  Procedure Laterality Date  . TUBAL LIGATION      Social History   Socioeconomic History  . Marital status: Divorced    Spouse name: Not on file  . Number of children: 3  . Years of education: Not on file  . Highest education level: 9th grade  Occupational History  . Not on file  Tobacco Use  . Smoking status: Current Every Day Smoker    Packs/day: 0.75    Years: 42.00    Pack years: 31.50    Types: Cigarettes  . Smokeless tobacco: Never Used  . Tobacco comment: 10 per day   Substance and Sexual Activity  . Alcohol use: Yes    Alcohol/week: 4.0 standard drinks    Types: 4 Cans of beer per week    Comment: every other day  . Drug use: Yes   Frequency: 2.0 times per week    Types: Marijuana    Comment: THC about once per week  . Sexual activity: Yes    Birth control/protection: Surgical  Other Topics Concern  . Not on file  Social History Narrative  . Not on file   Social Determinants of Health   Financial Resource Strain:   . Difficulty of Paying Living Expenses: Not on file  Food Insecurity:   . Worried About Charity fundraiser in the Last Year: Not on file  . Ran Out of Food in the Last Year: Not on file  Transportation Needs: No Transportation Needs  . Lack of Transportation (Medical): No  . Lack of Transportation (Non-Medical): No  Physical Activity:   . Days of Exercise per Week: Not on file  . Minutes of Exercise per Session: Not on file  Stress:   . Feeling of Stress : Not on file  Social Connections:   . Frequency of Communication with Friends and Family: Not on file  . Frequency of Social Gatherings with Friends and Family: Not on file  . Attends Religious Services: Not on file  . Active Member of Clubs or Organizations: Not on file  . Attends Archivist Meetings: Not on file  . Marital  Status: Not on file  Intimate Partner Violence:   . Fear of Current or Ex-Partner: Not on file  . Emotionally Abused: Not on file  . Physically Abused: Not on file  . Sexually Abused: Not on file     Allergies  Allergen Reactions  . Penicillins     REACTION: swelling of tongue; hives     Outpatient Medications Prior to Visit  Medication Sig Dispense Refill  . amLODipine (NORVASC) 5 MG tablet Take 1 tablet (5 mg total) by mouth daily. 90 tablet 3  . Blood Pressure Monitor DEVI Please provide patient with insurance approved blood pressure monitor 1 each 0   No facility-administered medications prior to visit.    Review of Systems  Constitutional: Positive for weight loss. Negative for chills, fever and malaise/fatigue.  HENT: Negative for hearing loss, sore throat and tinnitus.   Eyes: Negative  for blurred vision and double vision.  Respiratory: Negative for cough, hemoptysis, sputum production, shortness of breath, wheezing and stridor.   Cardiovascular: Negative for chest pain, palpitations, orthopnea, leg swelling and PND.  Gastrointestinal: Negative for abdominal pain, constipation, diarrhea, heartburn, nausea and vomiting.  Genitourinary: Negative for dysuria, hematuria and urgency.  Musculoskeletal: Negative for joint pain and myalgias.  Skin: Negative for itching and rash.  Neurological: Negative for dizziness, tingling, weakness and headaches.  Endo/Heme/Allergies: Negative for environmental allergies. Does not bruise/bleed easily.  Psychiatric/Behavioral: Negative for depression. The patient is not nervous/anxious and does not have insomnia.   All other systems reviewed and are negative.    Objective:  Physical Exam Vitals reviewed.  Constitutional:      General: She is not in acute distress.    Appearance: She is well-developed.  HENT:     Head: Normocephalic and atraumatic.     Mouth/Throat:     Pharynx: No oropharyngeal exudate.  Eyes:     Conjunctiva/sclera: Conjunctivae normal.     Pupils: Pupils are equal, round, and reactive to light.  Neck:     Vascular: No JVD.     Trachea: No tracheal deviation.     Comments: Loss of supraclavicular fat Cardiovascular:     Rate and Rhythm: Normal rate and regular rhythm.     Heart sounds: S1 normal and S2 normal.     Comments: Distant heart tones Pulmonary:     Effort: No tachypnea or accessory muscle usage.     Breath sounds: No stridor. Decreased breath sounds (throughout all lung fields) present. No wheezing, rhonchi or rales.  Abdominal:     General: Bowel sounds are normal. There is no distension.     Palpations: Abdomen is soft.     Tenderness: There is no abdominal tenderness.  Musculoskeletal:        General: Deformity (muscle wasting ) present.  Skin:    General: Skin is warm and dry.     Capillary  Refill: Capillary refill takes less than 2 seconds.     Findings: No rash.  Neurological:     Mental Status: She is alert and oriented to person, place, and time.  Psychiatric:        Behavior: Behavior normal.      Vitals:   07/08/19 1455  BP: 120/74  Pulse: (!) 106  Temp: (!) 97.3 F (36.3 C)  TempSrc: Temporal  SpO2: 99%  Weight: 130 lb (59 kg)  Height: 5\' 4"  (1.626 m)   99% on RA BMI Readings from Last 3 Encounters:  07/08/19 22.31 kg/m  04/20/19 23.34 kg/m  12/24/18 23.34 kg/m   Wt Readings from Last 3 Encounters:  07/08/19 130 lb (59 kg)  12/24/18 136 lb (61.7 kg)  01/06/13 136 lb 7 oz (61.9 kg)     CBC    Component Value Date/Time   WBC 4.5 05/05/2019 1355   WBC 9.5 06/28/2018 1251   RBC 3.91 05/05/2019 1355   RBC 4.01 06/28/2018 1251   HGB 13.7 05/05/2019 1355   HCT 38.9 05/05/2019 1355   PLT 130 (L) 05/05/2019 1355   MCV 100 (H) 05/05/2019 1355   MCH 35.0 (H) 05/05/2019 1355   MCH 33.9 06/28/2018 1251   MCHC 35.2 05/05/2019 1355   MCHC 34.9 06/28/2018 1251   RDW 12.8 05/05/2019 1355   LYMPHSABS 1.8 06/28/2018 1251   MONOABS 0.5 06/28/2018 1251   EOSABS 0.0 06/28/2018 1251   BASOSABS 0.0 06/28/2018 1251    Chest Imaging: Oct 2020 LDCT: RUL nodule, very peripheral. The patient's images have been independently reviewed by me.   Feb 2021 LDCT: RUL nodule, peripheral, still persistent, The patient's images have been independently reviewed by me.    Pulmonary Functions Testing Results: No flowsheet data found.  FeNO: None   Pathology: none   Echocardiogram: None   Heart Catheterization: none     Assessment & Plan:     ICD-10-CM   1. Lung nodule  R91.1 Pulmonary Function Test    NM PET Image Initial (PI) Skull Base To Thigh  2. Tobacco abuse  Z72.0   3. Abnormal CT scan of lung  R91.8   4. Weight loss  R63.4     Discussion: 59 yo fm, long standing history of smoking, RUL peripheral nodule, with airway going to the lesion.  Persistent on previous lung cancer screening ct images. Today in the office we discussed various options for evaluation. We discussed the possibility of a new lung cancer diagnosis and the fact this could represent inflammatory scarring.   Either way. Smoking cessation is a must. She states that she will quit smoking today. CT imaging with areas of centrilobular emphysema.   Plan: PET CT ASAP PFTs ASAP Pending these if pet avid would consider referral to Dr. Kipp Brood to consider robotic VATs If needed we would be glad to offer ICG dye marking via navigational bronchoscopy.  However, these decisions would need to wait until PET imaging has been complete.  Start samples of Anoro  >50% of this patients 45 min office visit was spent face to face discussing images and agreeing on desired management plan as stated above. Patient was instructed on inhaler use.    Current Outpatient Medications:  .  amLODipine (NORVASC) 5 MG tablet, Take 1 tablet (5 mg total) by mouth daily., Disp: 90 tablet, Rfl: 3 .  Blood Pressure Monitor DEVI, Please provide patient with insurance approved blood pressure monitor, Disp: 1 each, Rfl: 0   Garner Nash, DO [facility] 07/08/2019 3:29 PM

## 2019-07-08 NOTE — Patient Instructions (Signed)
Thank you for visiting Dr. Valeta Harms at [facility]. Today we recommend the following:  Orders Placed This Encounter  Procedures  . NM PET Image Initial (PI) Skull Base To Thigh  . Pulmonary Function Test   We need PFTs ASAP and PET/CT We cannot have complete surgical clearance until PFTs complete. Smoking cessation is a must.   Return in about 3 weeks (around 07/29/2019) for with APP or Dr. Valeta Harms. telephone visit     Please do your part to reduce the spread of COVID-19.

## 2019-07-12 ENCOUNTER — Other Ambulatory Visit (HOSPITAL_COMMUNITY)
Admission: RE | Admit: 2019-07-12 | Discharge: 2019-07-12 | Disposition: A | Discharge status: Home / Self Care | Payer: Medicaid Other | Source: Ambulatory Visit | Attending: Pulmonary Disease | Admitting: Pulmonary Disease

## 2019-07-12 DIAGNOSIS — Z20822 Contact with and (suspected) exposure to covid-19: Secondary | ICD-10-CM | POA: Insufficient documentation

## 2019-07-12 DIAGNOSIS — Z01812 Encounter for preprocedural laboratory examination: Secondary | ICD-10-CM | POA: Diagnosis not present

## 2019-07-13 LAB — SARS CORONAVIRUS 2 (TAT 6-24 HRS): SARS Coronavirus 2: NEGATIVE

## 2019-07-15 ENCOUNTER — Ambulatory Visit (HOSPITAL_COMMUNITY)
Admission: RE | Admit: 2019-07-15 | Discharge: 2019-07-15 | Disposition: A | Discharge status: Home / Self Care | Payer: Medicaid Other | Source: Ambulatory Visit | Attending: Pulmonary Disease | Admitting: Pulmonary Disease

## 2019-07-15 ENCOUNTER — Other Ambulatory Visit: Payer: Self-pay

## 2019-07-15 DIAGNOSIS — R911 Solitary pulmonary nodule: Secondary | ICD-10-CM | POA: Diagnosis present

## 2019-07-15 LAB — PULMONARY FUNCTION TEST
DL/VA % pred: 84 %
DL/VA: 3.59 ml/min/mmHg/L
DLCO unc % pred: 76 %
DLCO unc: 15.62 ml/min/mmHg
FEF 25-75 Post: 1.58 L/sec
FEF 25-75 Pre: 1.43 L/sec
FEF2575-%Change-Post: 10 %
FEF2575-%Pred-Post: 74 %
FEF2575-%Pred-Pre: 67 %
FEV1-%Change-Post: -2 %
FEV1-%Pred-Post: 96 %
FEV1-%Pred-Pre: 98 %
FEV1-Post: 2.04 L
FEV1-Pre: 2.09 L
FEV1FVC-%Change-Post: 0 %
FEV1FVC-%Pred-Pre: 89 %
FEV6-%Change-Post: -1 %
FEV6-%Pred-Post: 108 %
FEV6-%Pred-Pre: 110 %
FEV6-Post: 2.82 L
FEV6-Pre: 2.87 L
FEV6FVC-%Change-Post: 1 %
FEV6FVC-%Pred-Post: 102 %
FEV6FVC-%Pred-Pre: 101 %
FVC-%Change-Post: -2 %
FVC-%Pred-Post: 106 %
FVC-%Pred-Pre: 109 %
FVC-Post: 2.85 L
FVC-Pre: 2.94 L
Post FEV1/FVC ratio: 71 %
Post FEV6/FVC ratio: 99 %
Pre FEV1/FVC ratio: 71 %
Pre FEV6/FVC Ratio: 98 %
RV % pred: 281 %
RV: 5.53 L
TLC % pred: 167 %
TLC: 8.44 L

## 2019-07-15 MED ORDER — ALBUTEROL SULFATE (2.5 MG/3ML) 0.083% IN NEBU
2.5000 mg | INHALATION_SOLUTION | Freq: Once | RESPIRATORY_TRACT | Status: AC
  Administered 2019-07-15: 2.5 mg via RESPIRATORY_TRACT

## 2019-07-15 NOTE — Progress Notes (Signed)
Seen in office by Dr. Valeta Harms 2/25, PET scan scheduled for 3/5. See Telephone notes and OV note ( 07/08/2019)

## 2019-07-16 ENCOUNTER — Encounter (HOSPITAL_COMMUNITY): Admission: RE | Admit: 2019-07-16 | Payer: Medicaid Other | Source: Ambulatory Visit

## 2019-07-19 ENCOUNTER — Ambulatory Visit (HOSPITAL_COMMUNITY)
Admission: RE | Admit: 2019-07-19 | Discharge: 2019-07-19 | Disposition: A | Discharge status: Home / Self Care | Payer: Medicaid Other | Source: Ambulatory Visit | Attending: Pulmonary Disease | Admitting: Pulmonary Disease

## 2019-07-19 ENCOUNTER — Other Ambulatory Visit: Payer: Self-pay

## 2019-07-19 DIAGNOSIS — R911 Solitary pulmonary nodule: Secondary | ICD-10-CM | POA: Insufficient documentation

## 2019-07-19 LAB — GLUCOSE, CAPILLARY: Glucose-Capillary: 76 mg/dL (ref 70–99)

## 2019-07-19 MED ORDER — FLUDEOXYGLUCOSE F - 18 (FDG) INJECTION
6.5000 | Freq: Once | INTRAVENOUS | Status: AC | PRN
  Administered 2019-07-19: 11:00:00 6.5 via INTRAVENOUS

## 2019-07-26 ENCOUNTER — Telehealth: Payer: Self-pay | Admitting: Pulmonary Disease

## 2019-07-26 DIAGNOSIS — R911 Solitary pulmonary nodule: Secondary | ICD-10-CM

## 2019-07-26 DIAGNOSIS — R918 Other nonspecific abnormal finding of lung field: Secondary | ICD-10-CM

## 2019-07-26 NOTE — Telephone Encounter (Signed)
PCCM:  PET scan reviewed, peripheral upperlobe lesion  PFTs reviewed  PFT Results Latest Ref Rng & Units 07/15/2019  FVC-Pre L 2.94  FVC-Predicted Pre % 109  FVC-Post L 2.85  FVC-Predicted Post % 106  Pre FEV1/FVC % % 71  Post FEV1/FCV % % 71  FEV1-Pre L 2.09  FEV1-Predicted Pre % 98  FEV1-Post L 2.04  DLCO UNC% % 76  DLCO COR %Predicted % 84  TLC L 8.44  TLC % Predicted % 167  RV % Predicted % 281     Discussed both with patient via phone.  Referral placed to cardiothoracic surgery, Dr. Kipp Brood for consideration of robotic VATS.  Per patient she has quit smoking.    [facility] 07/26/2019 6:50 PM

## 2019-07-27 NOTE — Telephone Encounter (Signed)
Thanks so much for seeing her

## 2019-08-04 ENCOUNTER — Encounter: Payer: Self-pay | Admitting: Pulmonary Disease

## 2019-08-04 ENCOUNTER — Other Ambulatory Visit: Payer: Self-pay

## 2019-08-04 ENCOUNTER — Ambulatory Visit (INDEPENDENT_AMBULATORY_CARE_PROVIDER_SITE_OTHER): Payer: Medicaid Other | Admitting: Pulmonary Disease

## 2019-08-04 VITALS — BP 118/70 | HR 71 | Ht 64.0 in | Wt 131.8 lb

## 2019-08-04 DIAGNOSIS — R942 Abnormal results of pulmonary function studies: Secondary | ICD-10-CM

## 2019-08-04 DIAGNOSIS — R911 Solitary pulmonary nodule: Secondary | ICD-10-CM | POA: Diagnosis not present

## 2019-08-04 DIAGNOSIS — Z720 Tobacco use: Secondary | ICD-10-CM | POA: Diagnosis not present

## 2019-08-04 DIAGNOSIS — R918 Other nonspecific abnormal finding of lung field: Secondary | ICD-10-CM | POA: Diagnosis not present

## 2019-08-04 NOTE — Progress Notes (Signed)
Synopsis: Referred in Feb 2021 for abnormal lung cancer screening, PCP: Gildardo Pounds, NP  Subjective:   PATIENT ID: Daphine Deutscher GENDER: female DOB: 01/08/1961, MRN: [id number]  Chief Complaint  Patient presents with  . Follow-up    Pt states she has been doing okay since last visit and denies any real complaints.    59 year old female past medical history of hypertension, type I bipolar disease, Labeled with schizophrenia, family history of breast cancer.  Patient has a longstanding history of smoking.  She also has pretty regular daily alcohol use.  She has spoke since she was a teenager.  She was enrolled in our lung cancer screening program in October 2020.  This showed a right upper lobe peripheral lesion.  This unfortunately was repeat imaged here in February which showed enlargement/persistence of the lesion.  Patient was referred for evaluation of abnormality of within the lung cancer screening CT.  OV 08/04/2019: Patient here today for follow-up after recent pet imaging as well as pulmonary function test. Patient had pulmonary function test on 07/15/2019 which revealed a normal FEV1 FVC ratio as well as FEV1 2.04 L, 96% predicted, 2.85 L, 106% predicted no significant bronchodilator response, she does have significant air trapping, elevated RV 281%, DLCO 76%. 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 Please she has been able to almost quit smoking. She states that she has only smoked 1 cigarette every other day. At this point is ready to lay down. Graduated her on this. She does have a appointment with cardiothoracic surgery on Friday of this week.    Past Medical History:  Diagnosis Date  . Bipolar 1 disorder ([place])   . Depression   . Hypertension   . Mental disorder   . Schizophrenia ([place])      Family History  Problem Relation  Age of Onset  . Breast cancer Maternal Aunt   . Breast cancer Maternal Aunt   . Breast cancer Maternal Aunt   . Diabetes Mother      Past Surgical History:  Procedure Laterality Date  . TUBAL LIGATION      Social History   Socioeconomic History  . Marital status: Divorced    Spouse name: Not on file  . Number of children: 3  . Years of education: Not on file  . Highest education level: 9th grade  Occupational History  . Not on file  Tobacco Use  . Smoking status: Current Every Day Smoker    Packs/day: 0.75    Years: 42.00    Pack years: 31.50    Types: Cigarettes  . Smokeless tobacco: Never Used  . Tobacco comment: pt smoking about 1 cig per day  Substance and Sexual Activity  . Alcohol use: Yes    Alcohol/week: 4.0 standard drinks    Types: 4 Cans of beer per week    Comment: every other day  . Drug use: Yes    Frequency: 2.0 times per week    Types: Marijuana    Comment: THC about once per week  . Sexual activity: Yes    Birth control/protection: Surgical  Other Topics Concern  . Not on file  Social History Narrative  . Not on file   Social Determinants of Health   Financial Resource Strain:   . Difficulty of Paying Living Expenses:   Food Insecurity:   .  Worried About Charity fundraiser in the Last Year:   . Arboriculturist in the Last Year:   Transportation Needs: No Transportation Needs  . Lack of Transportation (Medical): No  . Lack of Transportation (Non-Medical): No  Physical Activity:   . Days of Exercise per Week:   . Minutes of Exercise per Session:   Stress:   . Feeling of Stress :   Social Connections:   . Frequency of Communication with Friends and Family:   . Frequency of Social Gatherings with Friends and Family:   . Attends Religious Services:   . Active Member of Clubs or Organizations:   . Attends Archivist Meetings:   Marland Kitchen Marital Status:   Intimate Partner Violence:   . Fear of Current or Ex-Partner:   . Emotionally  Abused:   Marland Kitchen Physically Abused:   . Sexually Abused:      Allergies  Allergen Reactions  . Penicillins     REACTION: swelling of tongue; hives     Outpatient Medications Prior to Visit  Medication Sig Dispense Refill  . amLODipine (NORVASC) 5 MG tablet Take 1 tablet (5 mg total) by mouth daily. 90 tablet 3  . Blood Pressure Monitor DEVI Please provide patient with insurance approved blood pressure monitor 1 each 0  . umeclidinium-vilanterol (ANORO ELLIPTA) 62.5-25 MCG/INH AEPB Inhale 1 puff into the lungs daily. 60 each 0   No facility-administered medications prior to visit.    Review of Systems  Constitutional: Negative for chills, fever, malaise/fatigue and weight loss.  HENT: Negative for hearing loss, sore throat and tinnitus.   Eyes: Negative for blurred vision and double vision.  Respiratory: Positive for cough and shortness of breath. Negative for hemoptysis, sputum production, wheezing and stridor.   Cardiovascular: Negative for chest pain, palpitations, orthopnea, leg swelling and PND.  Gastrointestinal: Negative for abdominal pain, constipation, diarrhea, heartburn, nausea and vomiting.  Genitourinary: Negative for dysuria, hematuria and urgency.  Musculoskeletal: Negative for joint pain and myalgias.  Skin: Negative for itching and rash.  Neurological: Negative for dizziness, tingling, weakness and headaches.  Endo/Heme/Allergies: Negative for environmental allergies. Does not bruise/bleed easily.  Psychiatric/Behavioral: Negative for depression. The patient is not nervous/anxious and does not have insomnia.   All other systems reviewed and are negative.    Objective:  Physical Exam Vitals reviewed.  Constitutional:      General: She is not in acute distress.    Appearance: She is well-developed.  HENT:     Head: Normocephalic and atraumatic.     Mouth/Throat:     Pharynx: No oropharyngeal exudate.  Eyes:     Conjunctiva/sclera: Conjunctivae normal.      Pupils: Pupils are equal, round, and reactive to light.  Neck:     Vascular: No JVD.     Trachea: No tracheal deviation.     Comments: Loss of supraclavicular fat Cardiovascular:     Rate and Rhythm: Normal rate and regular rhythm.     Heart sounds: S1 normal and S2 normal.     Comments: Distant heart tones Pulmonary:     Effort: No tachypnea or accessory muscle usage.     Breath sounds: No stridor. Decreased breath sounds (throughout all lung fields) present. No wheezing, rhonchi or rales.  Abdominal:     General: Bowel sounds are normal. There is no distension.     Palpations: Abdomen is soft.     Tenderness: There is no abdominal tenderness.  Musculoskeletal:  General: No deformity (muscle wasting ).  Skin:    General: Skin is warm and dry.     Capillary Refill: Capillary refill takes less than 2 seconds.     Findings: No rash.  Neurological:     Mental Status: She is alert and oriented to person, place, and time.  Psychiatric:        Behavior: Behavior normal.      Vitals:   08/04/19 1555  BP: 118/70  Pulse: 71  SpO2: 99%  Weight: 131 lb 12.8 oz (59.8 kg)  Height: 5\' 4"  (1.626 m)   99% on RA BMI Readings from Last 3 Encounters:  08/04/19 22.62 kg/m  07/08/19 22.31 kg/m  04/20/19 23.34 kg/m   Wt Readings from Last 3 Encounters:  08/04/19 131 lb 12.8 oz (59.8 kg)  07/08/19 130 lb (59 kg)  12/24/18 136 lb (61.7 kg)     CBC    Component Value Date/Time   WBC 4.5 05/05/2019 1355   WBC 9.5 06/28/2018 1251   RBC 3.91 05/05/2019 1355   RBC 4.01 06/28/2018 1251   HGB 13.7 05/05/2019 1355   HCT 38.9 05/05/2019 1355   PLT 130 (L) 05/05/2019 1355   MCV 100 (H) 05/05/2019 1355   MCH 35.0 (H) 05/05/2019 1355   MCH 33.9 06/28/2018 1251   MCHC 35.2 05/05/2019 1355   MCHC 34.9 06/28/2018 1251   RDW 12.8 05/05/2019 1355   LYMPHSABS 1.8 06/28/2018 1251   MONOABS 0.5 06/28/2018 1251   EOSABS 0.0 06/28/2018 1251   BASOSABS 0.0 06/28/2018 1251    Chest  Imaging: Oct 2020 LDCT: RUL nodule, very peripheral. The patient's images have been independently reviewed by me.   Feb 2021 LDCT: RUL nodule, peripheral, still persistent, The patient's images have been independently reviewed by me.    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 The patient's images have been independently reviewed by me.    Pulmonary Functions Testing Results: PFT Results Latest Ref Rng & Units 07/15/2019  FVC-Pre L 2.94  FVC-Predicted Pre % 109  FVC-Post L 2.85  FVC-Predicted Post % 106  Pre FEV1/FVC % % 71  Post FEV1/FCV % % 71  FEV1-Pre L 2.09  FEV1-Predicted Pre % 98  FEV1-Post L 2.04  DLCO UNC% % 76  DLCO COR %Predicted % 84  TLC L 8.44  TLC % Predicted % 167  RV % Predicted % 281    FeNO: None   Pathology: none   Echocardiogram: None   Heart Catheterization: none     Assessment & Plan:     ICD-10-CM   1. Lung nodule  R91.1   2. Tobacco abuse  Z72.0   3. Abnormal CT scan of lung  R91.8   4. Abnormal PET of right lung  R94.2    Discussion:  59 year old History of smoking, right upper lobe peripheral nodule along the medial portion of the right upper lobe. In the office we reviewed the patient's medicine pet imaging. I also reviewed patient's pulmonary function test.  Plan: Appointment set to me cardiothoracic surgery this Friday. Congratulated on smoking cessation, she needs to stay smoke-free. We will await cardiothoracic surgery's recommendations for consideration for VATS. Pulmonary will be available for navigational bronchoscopy and I marked if deemed a surgical candidate. Continue Anoro Ellipta . We also reviewed patient's pulmonary function test today in the office. Individual interpretation as above.  Greater than 50% of  this patient's 35-minute office visit was spent face-to-face discussing the above recommendations and  treatment plan.   Current Outpatient Medications:  .  amLODipine (NORVASC) 5 MG tablet, Take 1 tablet (5 mg total) by mouth daily., Disp: 90 tablet, Rfl: 3 .  Blood Pressure Monitor DEVI, Please provide patient with insurance approved blood pressure monitor, Disp: 1 each, Rfl: 0 .  umeclidinium-vilanterol (ANORO ELLIPTA) 62.5-25 MCG/INH AEPB, Inhale 1 puff into the lungs daily., Disp: 60 each, Rfl: 0   Garner Nash, DO [facility] 08/04/2019 4:24 PM

## 2019-08-04 NOTE — Patient Instructions (Signed)
Thank you for visiting Dr. Valeta Harms at [facility]. Today we recommend the following:  Follow up with planned appt on Friday with Dr. Kipp Brood.   Return in about 2 months (around 10/04/2019) for w/ Kiyanna Biegler .    Please do your part to reduce the spread of COVID-19.

## 2019-08-05 ENCOUNTER — Other Ambulatory Visit: Payer: Self-pay | Admitting: Thoracic Surgery (Cardiothoracic Vascular Surgery)

## 2019-08-05 DIAGNOSIS — J90 Pleural effusion, not elsewhere classified: Secondary | ICD-10-CM

## 2019-08-05 NOTE — Progress Notes (Signed)
[address]       [place] [address]             [phone]                    [name] [place] Medical Record [id number] Date of Birth: 08-01-1960  Referring: Gildardo Pounds, NP Primary Care: Gildardo Pounds, NP Primary Cardiologist: No primary care provider on file.  Chief Complaint:   No chief complaint on file.   History of Present Illness:    [name] 59 y.o. female presents for surgical evaluation right upper lobe nodule.  She has a long history of tobacco use this was initially found screening CT.  In regards to her symptoms she does have a chronic cough states that she has lost almost 40 pounds over the last year.  She also complains of insomnia for the last years.  She does have some back pain but denies any neurologic symptoms.     Smoking Hx: Continues to smoke.  Has been smoking since age 75.  She had a cigarette on 08/05/2019 but states that she wants to quit.   Zubrod Score: At the time of surgery this patient's most appropriate activity status/level should be described as: [x]     0    Normal activity, no symptoms []     1    Restricted in physical strenuous activity but ambulatory, able to do out light work []     2    Ambulatory and capable of self care, unable to do work activities, up and about               >50 % of waking hours                              []     3    Only limited self care, in bed greater than 50% of waking hours []     4    Completely disabled, no self care, confined to bed or chair []     5    Moribund   Past Medical History:  Diagnosis Date  . Bipolar 1 disorder ([place])   . Depression   . Hypertension   . Mental disorder   . Schizophrenia [facility])     Past Surgical History:  Procedure Laterality Date  . TUBAL LIGATION      Family History  Problem Relation Age of Onset  . Breast cancer Maternal Aunt   . Breast cancer Maternal Aunt   . Breast cancer Maternal Aunt   . Diabetes Mother       Social History   Tobacco Use  Smoking Status Current Every Day Smoker  . Packs/day: 0.75  . Years: 42.00  . Pack years: 31.50  . Types: Cigarettes  Smokeless Tobacco Never Used  Tobacco Comment   pt smoking about 1 cig per day    Social History   Substance and Sexual Activity  Alcohol Use Yes  . Alcohol/week: 4.0 standard drinks  . Types: 4 Cans of beer per week   Comment: every other day     Allergies  Allergen Reactions  . Penicillins     REACTION: swelling of tongue; hives    Current Outpatient Medications  Medication Sig Dispense Refill  . amLODipine (NORVASC) 5 MG tablet Take 1 tablet (5 mg total) by mouth daily. 90 tablet 3  .  Blood Pressure Monitor DEVI Please provide patient with insurance approved blood pressure monitor 1 each 0  . umeclidinium-vilanterol (ANORO ELLIPTA) 62.5-25 MCG/INH AEPB Inhale 1 puff into the lungs daily. 60 each 0   No current facility-administered medications for this visit.    Review of Systems  Constitutional: Positive for weight loss.  Respiratory: Positive for cough and shortness of breath.   Cardiovascular: Negative.   Gastrointestinal: Negative.   Musculoskeletal: Positive for back pain.  Neurological: Negative.      PHYSICAL EXAMINATION: LMP 10/25/2012  Physical Exam  Constitutional: She is oriented to person, place, and time. She appears well-developed and well-nourished. No distress.  HENT:  Head: Normocephalic and atraumatic.  Eyes: Conjunctivae and EOM are normal.  Neck: No tracheal deviation present.  Cardiovascular: Normal rate, regular rhythm and normal heart sounds.  No murmur heard. Respiratory: Effort normal. No respiratory distress.  Distant breath sounds.  GI: She exhibits no distension.  Musculoskeletal:     Cervical back: Normal range of motion.  Neurological: She is alert and oriented to person, place, and time.  Skin: Skin is warm and dry. She is not diaphoretic.    Diagnostic Studies &  Laboratory data:     Recent Radiology Findings:   NM PET Image Initial (PI) Skull Base To Thigh  Result Date: 07/19/2019 CLINICAL DATA:  Initial treatment strategy for right upper lobe lung nodule. EXAM: NUCLEAR MEDICINE PET SKULL BASE TO THIGH TECHNIQUE: 6.5 mCi F-18 FDG was injected intravenously. Full-ring PET imaging was performed from the skull base to thigh after the radiotracer. CT data was obtained and used for attenuation correction and anatomic localization. Fasting blood glucose: 76 mg/dl COMPARISON:  Multiple exams, including chest CT 06/18/2019 FINDINGS: Mediastinal blood pool activity: SUV max 2.3 Liver activity: SUV max N/A NECK: Bilaterally symmetric accentuated activity in the vicinity of the arytenoid cartilages, maximum SUV 9.5 on the left and 9.4 on the right, likely physiologic. Low-grade anterior paravertebral activity bilaterally, likely physiologic or degenerative. Incidental CT findings: none CHEST: The medial right upper lobe nodule persists about 1.8 by 1.4 cm in size, maximum SUV 2.5. No other focal hypermetabolic activity is appreciated in the chest. Incidental CT findings: Emphysema. Scattered scarring. Scattered interstitial accentuation Eck adjacent to some of the clustered regions of acquired cystic lung disease, meriting surveillance by CT. ABDOMEN/PELVIS: Low-grade focal activity at the splenic flexure, maximum SUV 2.9, probably incidental, but correlation with colonoscopy/colon screening history recommended. There other small foci of accentuated activity in the bowel, likely physiologic. Activity medially in the right gluteus maximus muscle is likely physiologic. Incidental CT findings: Aortoiliac atherosclerotic vascular disease. Sigmoid colon diverticulosis. SKELETON: No significant abnormal hypermetabolic activity in this region. Incidental CT findings: none IMPRESSION: 1. The irregular medial right upper lobe nodule shown on recent CT persists and has a maximum SUV of 2.5.  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 Tissue diagnosis suggested. 2. Emphysema with some interstitial accentuation along some of the clustered regions of acquired cystic lung disease. None of these demonstrate accentuated metabolic activity. These are likely inflammatory but periodic surveillance is suggested. 3. Colon activity is most likely physiologic, although correlation with the patient's colon screening history is recommended in determining whether further workup of the colon is indicated. 4. Other imaging findings of potential clinical significance: Aortic Atherosclerosis (ICD10-I70.0). Sigmoid colon diverticulosis. Emphysema (ICD10-J43.9). Electronically Signed   By: Van Clines M.D.   On: 07/19/2019 12:45       I  have independently reviewed the above radiology studies  and reviewed the findings with the patient.   Recent Lab Findings: Lab Results  Component Value Date   WBC 4.5 05/05/2019   HGB 13.7 05/05/2019   HCT 38.9 05/05/2019   PLT 130 (L) 05/05/2019   GLUCOSE 70 05/05/2019   CHOL 269 (H) 05/05/2019   TRIG 55 05/05/2019   HDL 173 05/05/2019   LDLCALC 87 05/05/2019   ALT 50 (H) 05/05/2019   AST 130 (H) 05/05/2019   NA 139 05/05/2019   K 4.4 05/05/2019   CL 97 05/05/2019   CREATININE 0.67 05/05/2019   BUN 6 05/05/2019   CO2 24 05/05/2019   TSH 0.205 (L) 07/09/2010   INR 1.03 11/04/2009   HGBA1C 4.9 05/05/2019     PFTs: - FVC: 109% - FEV1: 98% -DLCO: 76%  Problem List: 1.8 cm right upper lobe pulmonary nodule with SUV of 2.5.  Assessment / Plan:   59 year old female with a 1.8 cm right upper lobe nodule with increased SUV uptake.  Given her smoking history likely primary lung cancer is high on the differential.  I personally reviewed her images and agree with Dr. Valeta Harms in that she would be an ideal candidate for bronchoscopic marking followed by surgical biopsy.  We discussed several options which  included a CT-guided biopsy versus navigational biopsy versus surgical biopsy.  She is leaving tomorrow to undergo surgical biopsy.  Regards to treatment: Discussed several options including SBRT versus surgery.  The main concern is that she would not be able to stop smoking.  I told her that she would need to be off cigarettes for 2 weeks prior to scheduling any surgery.  She is tentatively scheduled for navigational bronchoscopy with marking by Dr. Valeta Harms followed by robotic assisted right thoracoscopy, wedge resection, possible right upper lobectomy.     I  spent 40 minutes with  the patient face to face and greater then 50% of the time was spent in counseling and coordination of care.    Lajuana Matte 08/05/2019 12:28 PM

## 2019-08-06 ENCOUNTER — Encounter: Payer: Self-pay | Admitting: *Deleted

## 2019-08-06 ENCOUNTER — Other Ambulatory Visit: Payer: Self-pay | Admitting: Thoracic Surgery (Cardiothoracic Vascular Surgery)

## 2019-08-06 ENCOUNTER — Other Ambulatory Visit: Payer: Self-pay

## 2019-08-06 ENCOUNTER — Ambulatory Visit
Admission: RE | Admit: 2019-08-06 | Discharge: 2019-08-06 | Disposition: A | Discharge status: Home / Self Care | Payer: Medicaid Other | Source: Ambulatory Visit | Attending: Thoracic Surgery (Cardiothoracic Vascular Surgery) | Admitting: Thoracic Surgery (Cardiothoracic Vascular Surgery)

## 2019-08-06 ENCOUNTER — Encounter: Payer: Self-pay | Admitting: Thoracic Surgery (Cardiothoracic Vascular Surgery)

## 2019-08-06 ENCOUNTER — Other Ambulatory Visit: Payer: Self-pay | Admitting: *Deleted

## 2019-08-06 ENCOUNTER — Institutional Professional Consult (permissible substitution): Payer: Medicaid Other | Admitting: Thoracic Surgery (Cardiothoracic Vascular Surgery)

## 2019-08-06 VITALS — BP 77/56 | HR 71 | Temp 97.6°F | Resp 20 | Ht 64.0 in | Wt 130.0 lb

## 2019-08-06 DIAGNOSIS — J90 Pleural effusion, not elsewhere classified: Secondary | ICD-10-CM

## 2019-08-06 DIAGNOSIS — R911 Solitary pulmonary nodule: Secondary | ICD-10-CM

## 2019-08-12 ENCOUNTER — Telehealth (HOSPITAL_COMMUNITY): Payer: Self-pay | Admitting: *Deleted

## 2019-08-12 NOTE — Telephone Encounter (Signed)
error 

## 2019-08-12 NOTE — Telephone Encounter (Signed)
Left message on voicemail per DPR in reference to upcoming appointment scheduled on 08/16/19 at 10:15 with detailed instructions given per Myocardial Perfusion Study Information Sheet for the test. LM to arrive 15 minutes early, and that it is imperative to arrive on time for appointment to keep from having the test rescheduled. If you need to cancel or reschedule your appointment, please call the office within 24 hours of your appointment. Failure to do so may result in a cancellation of your appointment, and a $50 no show fee. Phone number given for call back for any questions.

## 2019-08-16 ENCOUNTER — Other Ambulatory Visit: Payer: Self-pay

## 2019-08-16 ENCOUNTER — Ambulatory Visit (HOSPITAL_COMMUNITY): Payer: Medicaid Other | Attending: Cardiovascular Disease

## 2019-08-16 DIAGNOSIS — J90 Pleural effusion, not elsewhere classified: Secondary | ICD-10-CM

## 2019-08-16 LAB — MYOCARDIAL PERFUSION IMAGING
LV dias vol: 52 mL (ref 46–106)
LV sys vol: 11 mL
Peak HR: 118 {beats}/min
Rest HR: 75 {beats}/min
SDS: 2
SRS: 1
SSS: 3
TID: 1.05

## 2019-08-16 MED ORDER — TECHNETIUM TC 99M TETROFOSMIN IV KIT
32.0000 | PACK | Freq: Once | INTRAVENOUS | Status: AC | PRN
  Administered 2019-08-16: 32 via INTRAVENOUS
  Filled 2019-08-16: qty 32

## 2019-08-16 MED ORDER — REGADENOSON 0.4 MG/5ML IV SOLN
0.4000 mg | Freq: Once | INTRAVENOUS | Status: AC
  Administered 2019-08-16: 0.4 mg via INTRAVENOUS

## 2019-08-16 MED ORDER — TECHNETIUM TC 99M TETROFOSMIN IV KIT
10.3000 | PACK | Freq: Once | INTRAVENOUS | Status: AC | PRN
  Administered 2019-08-16: 10.3 via INTRAVENOUS
  Filled 2019-08-16: qty 11

## 2019-08-17 ENCOUNTER — Encounter (HOSPITAL_COMMUNITY): Payer: Self-pay | Admitting: Anesthesiology

## 2019-08-18 NOTE — Progress Notes (Signed)
[place], [place] [address] Phone: [phone] Fax: [phone]  [id number] - [place], [place] - [address] [place] [address] Phone: [phone] Fax: [phone]      Your procedure is scheduled on Monday April 12th.  Report to [facility] Main Entrance "A" at 5:30 A.M., and check in at the Admitting office.  Call this number if you have problems the morning of surgery:  [phone]  Call [phone] if you have any questions prior to your surgery date Monday-Friday 8am-4pm    Remember:  Do not eat or drink after midnight the night before your surgery    Take these medicines the morning of surgery with A SIP OF WATER   amLODipine (NORVASC)  umeclidinium-vilanterol (ANORO ELLIPTA)  As of today, STOP taking any Aspirin (unless otherwise instructed by your surgeon) and Aspirin containing products, Aleve, Naproxen, Ibuprofen, Motrin, Advil, Goody's, BC's, all herbal medications, fish oil, and all vitamins.                      Do not wear jewelry, make up, or nail polish            Do not wear lotions, powders, perfumes, or deodorant.            Do not shave 48 hours prior to surgery.              Do not bring valuables to the hospital.            [facility] is not responsible for any belongings or valuables.  Do NOT Smoke (Tobacco/Vapping) or drink Alcohol 24 hours prior to your procedure If you use a CPAP at night, you may bring all equipment for your overnight stay.   Contacts, glasses, dentures or bridgework may not be worn into surgery.      For patients admitted to the hospital, discharge time will be determined by your treatment team.   Patients discharged the day of surgery will not be allowed to drive home, and someone needs to stay with them for 24 hours.    Special instructions:   [facility]- Preparing For Surgery  Before  surgery, you can play an important role. Because skin is not sterile, your skin needs to be as free of germs as possible. You can reduce the number of germs on your skin by washing with CHG (chlorahexidine gluconate) Soap before surgery.  CHG is an antiseptic cleaner which kills germs and bonds with the skin to continue killing germs even after washing.    Oral Hygiene is also important to reduce your risk of infection.  Remember - BRUSH YOUR TEETH THE MORNING OF SURGERY WITH YOUR REGULAR TOOTHPASTE  Please do not use if you have an allergy to CHG or antibacterial soaps. If your skin becomes reddened/irritated stop using the CHG.  Do not shave (including legs and underarms) for at least 48 hours prior to first CHG shower. It is OK to shave your face.  Please follow these instructions carefully.   1. Shower the NIGHT BEFORE SURGERY and the MORNING OF SURGERY with CHG Soap.   2. If you chose to wash your hair, wash your hair first as usual with your normal shampoo.  3. After you shampoo, rinse your hair and body thoroughly to remove the shampoo.  4. Use CHG as you would any other liquid  soap. You can apply CHG directly to the skin and wash gently with a scrungie or a clean washcloth.   5. Apply the CHG Soap to your body ONLY FROM THE NECK DOWN.  Do not use on open wounds or open sores. Avoid contact with your eyes, ears, mouth and genitals (private parts). Wash Face and genitals (private parts)  with your normal soap.   6. Wash thoroughly, paying special attention to the area where your surgery will be performed.  7. Thoroughly rinse your body with warm water from the neck down.  8. DO NOT shower/wash with your normal soap after using and rinsing off the CHG Soap.  9. Pat yourself dry with a CLEAN TOWEL.  10. Wear CLEAN PAJAMAS to bed the night before surgery, wear comfortable clothes the morning of surgery  11. Place CLEAN SHEETS on your bed the night of your first shower and DO NOT SLEEP  WITH PETS.   Day of Surgery:   Do not apply any deodorants/lotions.  Please wear clean clothes to the hospital/surgery center.   Remember to brush your teeth WITH YOUR REGULAR TOOTHPASTE.   Please read over the following fact sheets that you were given.

## 2019-08-19 ENCOUNTER — Inpatient Hospital Stay (HOSPITAL_COMMUNITY)
Admission: RE | Admit: 2019-08-19 | Discharge: 2019-08-19 | Disposition: A | Discharge status: Home / Self Care | Payer: Medicaid Other | Source: Ambulatory Visit

## 2019-08-19 ENCOUNTER — Telehealth: Payer: Self-pay | Admitting: Pulmonary Disease

## 2019-08-19 ENCOUNTER — Other Ambulatory Visit: Payer: Self-pay | Admitting: Pulmonary Disease

## 2019-08-19 ENCOUNTER — Other Ambulatory Visit (HOSPITAL_COMMUNITY)
Admission: RE | Admit: 2019-08-19 | Discharge: 2019-08-19 | Disposition: A | Discharge status: Home / Self Care | Payer: Medicaid Other | Source: Ambulatory Visit | Attending: Pulmonary Disease | Admitting: Pulmonary Disease

## 2019-08-19 ENCOUNTER — Other Ambulatory Visit (HOSPITAL_COMMUNITY): Payer: Self-pay | Admitting: Pulmonary Disease

## 2019-08-19 DIAGNOSIS — R911 Solitary pulmonary nodule: Secondary | ICD-10-CM

## 2019-08-19 DIAGNOSIS — R918 Other nonspecific abnormal finding of lung field: Secondary | ICD-10-CM

## 2019-08-19 DIAGNOSIS — Z01812 Encounter for preprocedural laboratory examination: Secondary | ICD-10-CM | POA: Diagnosis present

## 2019-08-19 DIAGNOSIS — Z20822 Contact with and (suspected) exposure to covid-19: Secondary | ICD-10-CM | POA: Insufficient documentation

## 2019-08-19 LAB — SARS CORONAVIRUS 2 (TAT 6-24 HRS): SARS Coronavirus 2: NEGATIVE

## 2019-08-19 NOTE — Telephone Encounter (Signed)
Looks like she did not show for the PAT appt.  Can we get her in to see me next week? Thanks Garner Nash, DO [facility] 08/19/2019 4:27 PM

## 2019-08-19 NOTE — Telephone Encounter (Signed)
Will forward to SG  

## 2019-08-19 NOTE — Progress Notes (Signed)
Notified by Dr. Valeta Harms that this patient needed a super D noncontrasted CT.  Spoke with Marilynne Halsted at [facility] who is awaiting authorization for this to be scheduled.  As of 1355 patient has not arrived for her 1300 appointed.  Called her phone with no answer left voicemail. Spoke with son and daughter as well.  Daughter stated she thought her mother was here or on her way.

## 2019-08-19 NOTE — Telephone Encounter (Signed)
We have precert now.  Called Ginger at phone # provided and left vm.  Called Elberta Fortis in [employer] and he states per note on acct by Cristie Hem it's ok to schedule her after 2:00.  Elberta Fortis will schedule her.  Nothing further is needed.

## 2019-08-19 NOTE — Telephone Encounter (Signed)
I have called the patient .She has changed her mind about surgical intervention for her pulmonary nodule. She states she would like to hear more about radiation options.  She is scheduled for biopsy and surgery on August 23, 2019. She is scheduled for preop Covid testing and lab work today. I have told her she needs to go ahead and get Covid tested and have her lab work done today. I explained that regardless of which treatment plan she decides upon she will need to have biopsy and tissue diagnosis for both surgery and radiation therapy. She verbalized understanding, and has agreed to go and get Covid tested and lab work done today as scheduled. I have messaged both Dr. Valeta Harms and Dr. Kipp Brood to let them know they may need to reschedule surgery until after biopsy results have been reviewed with the patient and options of treatment modalities have been reviewed with her again.

## 2019-08-20 ENCOUNTER — Encounter: Payer: Self-pay | Admitting: *Deleted

## 2019-08-20 ENCOUNTER — Other Ambulatory Visit: Payer: Self-pay | Admitting: *Deleted

## 2019-08-20 ENCOUNTER — Ambulatory Visit (HOSPITAL_COMMUNITY)
Admission: RE | Admit: 2019-08-20 | Discharge: 2019-08-20 | Disposition: A | Discharge status: Home / Self Care | Payer: Medicaid Other | Source: Ambulatory Visit | Attending: Pulmonary Disease | Admitting: Pulmonary Disease

## 2019-08-20 ENCOUNTER — Other Ambulatory Visit: Payer: Self-pay

## 2019-08-20 ENCOUNTER — Encounter (HOSPITAL_COMMUNITY): Payer: Self-pay | Admitting: Pulmonary Disease

## 2019-08-20 DIAGNOSIS — R918 Other nonspecific abnormal finding of lung field: Secondary | ICD-10-CM

## 2019-08-20 NOTE — Telephone Encounter (Signed)
Fingers crossed!

## 2019-08-20 NOTE — Progress Notes (Signed)
Spoke with daughter, patient has been quarantined since testing. No symptoms. All questions answered, her son and daughter will be her.

## 2019-08-20 NOTE — Progress Notes (Signed)
PCP - Dr. Geryl Rankins  PPM/ICD - denies  EKG - DOS Stress Test - 08/16/19  Sleep Study - Denies  COVID TEST- 08/19/19  Patient denies shortness of breath, fever, cough and chest pain  All instructions explained to the patient, with a verbal understanding of the material.  Patient also instructed to self quarantine after being tested for COVID-19. The opportunity to ask questions was provided.

## 2019-08-20 NOTE — Telephone Encounter (Signed)
Please schedule patient to see Dr. Valeta Harms next week. Thanks so much.

## 2019-08-20 NOTE — Telephone Encounter (Signed)
She got her CT complete and she got her covid test.  So, she may still be on for the case on Monday.  Dr. Kipp Brood is going to speak with her today.  I spoke with Dr. Kipp Brood this morning  Garner Nash, DO [facility] 08/20/2019 11:41 AM

## 2019-08-21 ENCOUNTER — Telehealth: Payer: Self-pay | Admitting: Pulmonary Disease

## 2019-08-21 NOTE — Telephone Encounter (Signed)
PCCM:  I attempted to call the patient. No answer.  I called and spoke with the patients daughter.   Endo case should be canceled for Monday. Lesion is very peripheral and likely not easily accessible for ENB. The initial ENB was schedules for dye marking and not planned as diagnostic.   Now since she has cancelled the ENB/RATS procedure we will need to discuss with IR for possible percutaneous approach and evaluation by Rad Onc for SBRT.   Thanks to all for helping coordinate.   Can we please present at [facility] on Thursday for the case to be reviewed by IR and Rad/onc?   Garner Nash, DO [facility] [facility] 08/21/2019 12:11 PM

## 2019-08-23 ENCOUNTER — Ambulatory Visit (HOSPITAL_COMMUNITY): Admission: RE | Admit: 2019-08-23 | Payer: Medicaid Other | Source: Home / Self Care | Admitting: Pulmonary Disease

## 2019-08-23 ENCOUNTER — Encounter (HOSPITAL_COMMUNITY): Admission: RE | Payer: Self-pay | Source: Home / Self Care

## 2019-08-23 SURGERY — VIDEO BRONCHOSCOPY WITH ENDOBRONCHIAL NAVIGATION
Anesthesia: General

## 2019-08-23 SURGERY — LOBECTOMY, LUNG, ROBOT-ASSISTED, USING VATS
Anesthesia: General | Site: Chest | Laterality: Right

## 2019-08-24 ENCOUNTER — Encounter: Payer: Self-pay | Admitting: Pulmonary Disease

## 2019-08-24 ENCOUNTER — Ambulatory Visit (INDEPENDENT_AMBULATORY_CARE_PROVIDER_SITE_OTHER): Payer: Medicaid Other | Admitting: Pulmonary Disease

## 2019-08-24 DIAGNOSIS — R918 Other nonspecific abnormal finding of lung field: Secondary | ICD-10-CM

## 2019-08-24 DIAGNOSIS — Z720 Tobacco use: Secondary | ICD-10-CM

## 2019-08-24 DIAGNOSIS — R942 Abnormal results of pulmonary function studies: Secondary | ICD-10-CM

## 2019-08-24 DIAGNOSIS — R911 Solitary pulmonary nodule: Secondary | ICD-10-CM

## 2019-08-24 NOTE — Progress Notes (Signed)
08/24/2019  Peer-to-peer: CT COPD chest without contrast  59 year old female current everyday smoker consulted with our practice on.  Last seen by our office on 08/04/2019.  She is seen by Dr. Valeta Harms.  Patient was supposed to present to [facility] as well as to our office for an endobronchial ultrasound.  She canceled the endobronchial ultrasound.  This is been an ongoing discussion between Eric Form, NP and Dr. Valeta Harms.  06/18/2019-CT chest lung cancer screening-lung RADS 4A, suspicious PET CT suggested for further characterization  07/19/2019-PET scan-irregular medial right upper lobe nodule shown on recent CT persist and has a maximum SUV of 2.5, given the lack of clearance at this opacity likely reflects a low-grade adenocarcinoma, tissue diagnosis is suggested  08/20/2019-CT chest super D chest-persistent focal of peripheral consolidation in the medial aspect of right upper lobe, no significant change from most recent PET scan, no lymphadenopathy  A Prior Auth was requested due to a denial of the patient's CT super D CT chest.   Provider completing peer to peer - Dr. Larence Penning  Denial reason: Cpt [phone] needs to be used when CT Super D CT Chest are needed for navigational purposes.   Since its completed already. An appeal will need to be scheduled once the denial is received. Will route to [facility] for follow up. Will route to Dr. Valeta Harms and Judson Roch as Juluis Rainier.   Wyn Quaker FNP

## 2019-08-25 ENCOUNTER — Ambulatory Visit (INDEPENDENT_AMBULATORY_CARE_PROVIDER_SITE_OTHER): Payer: Medicaid Other | Admitting: Pulmonary Disease

## 2019-08-25 ENCOUNTER — Other Ambulatory Visit: Payer: Self-pay

## 2019-08-25 ENCOUNTER — Encounter: Payer: Self-pay | Admitting: Pulmonary Disease

## 2019-08-25 VITALS — BP 112/76 | HR 67 | Temp 97.0°F | Ht 64.0 in | Wt 126.8 lb

## 2019-08-25 DIAGNOSIS — R918 Other nonspecific abnormal finding of lung field: Secondary | ICD-10-CM

## 2019-08-25 DIAGNOSIS — R942 Abnormal results of pulmonary function studies: Secondary | ICD-10-CM | POA: Diagnosis not present

## 2019-08-25 DIAGNOSIS — R911 Solitary pulmonary nodule: Secondary | ICD-10-CM

## 2019-08-25 DIAGNOSIS — Z720 Tobacco use: Secondary | ICD-10-CM

## 2019-08-25 DIAGNOSIS — F17210 Nicotine dependence, cigarettes, uncomplicated: Secondary | ICD-10-CM

## 2019-08-25 NOTE — Progress Notes (Signed)
Synopsis: Referred in Feb 2021 for abnormal lung cancer screening, PCP: Gildardo Pounds, NP  Subjective:   PATIENT ID: [name] GENDER: female DOB: August 27, 1960, MRN: [id number]  Chief Complaint  Patient presents with  . Follow-up    discuss options    59 year old female past medical history of hypertension, type I bipolar disease, Labeled with schizophrenia, family history of breast cancer.  Patient has a longstanding history of smoking.  She also has pretty regular daily alcohol use.  She has spoke since she was a teenager.  She was enrolled in our lung cancer screening program in October 2020.  This showed a right upper lobe peripheral lesion.  This unfortunately was repeat imaged here in February which showed enlargement/persistence of the lesion.  Patient was referred for evaluation of abnormality of within the lung cancer screening CT.  OV 08/04/2019: Patient here today for follow-up after recent pet imaging as well as pulmonary function test. Patient had pulmonary function test on 07/15/2019 which revealed a normal FEV1 FVC ratio as well as FEV1 2.04 L, 96% predicted, 2.85 L, 106% predicted no significant bronchodilator response, she does have significant air trapping, elevated RV 281%, DLCO 76%. 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 Please she has been able to almost quit smoking. She states that she has only smoked 1 cigarette every other day. At this point is ready to lay down. Graduated her on this. She does have a appointment with cardiothoracic surgery on Friday of this week.  08/25/2019: Patient was scheduled to have ENB+RATS with myself and Dr. Kipp Brood on 08/23/2019.  Patient canceled this procedure as she was anxious and fearful for potential complications and made the decision to cancel the procedure.  She has decided that she  would like to pursue radiation therapy alone for this lesion.  Today here for follow-up to make some decisions on exactly what she would like to do to proceed.  Due to the location of the lesion we discussed this today in the office would likely benefit from a percutaneous approach versus electromagnetic navigational bronchoscopy.  Case is to be discussed at medical thoracic oncology conference on 08/27/2019.     Past Medical History:  Diagnosis Date  . Anemia 1981   During chilbirth  . Bipolar 1 disorder ([place])   . Depression   . Hypertension   . Mental disorder   . Schizophrenia ([place])      Family History  Problem Relation Age of Onset  . Breast cancer Maternal Aunt   . Breast cancer Maternal Aunt   . Breast cancer Maternal Aunt   . Diabetes Mother      Past Surgical History:  Procedure Laterality Date  . NECK SURGERY     cyst  . TUBAL LIGATION      Social History   Socioeconomic History  . Marital status: Divorced    Spouse name: Not on file  . Number of children: 3  . Years of education: Not on file  . Highest education level: 9th grade  Occupational History  . Not on file  Tobacco Use  . Smoking status: Current Every Day Smoker    Packs/day: 0.75    Years: 42.00    Pack years: 31.50    Types: Cigarettes  . Smokeless tobacco: Never Used  . Tobacco comment: pt smoking about 2 cig per day  Substance and Sexual Activity  . Alcohol use: Yes    Alcohol/week: 4.0 standard drinks    Types: 4 Cans of beer per week    Comment: every other day  . Drug use: Yes    Frequency: 2.0 times per week    Types: Marijuana    Comment: THC about once per week  . Sexual activity: Yes    Birth control/protection: Surgical  Other Topics Concern  . Not on file  Social History Narrative  . Not on file   Social Determinants of Health   Financial Resource Strain:   . Difficulty of Paying Living Expenses:   Food Insecurity:   . Worried About Charity fundraiser in the Last  Year:   . Arboriculturist in the Last Year:   Transportation Needs: No Transportation Needs  . Lack of Transportation (Medical): No  . Lack of Transportation (Non-Medical): No  Physical Activity:   . Days of Exercise per Week:   . Minutes of Exercise per Session:   Stress:   . Feeling of Stress :   Social Connections:   . Frequency of Communication with Friends and Family:   . Frequency of Social Gatherings with Friends and Family:   . Attends Religious Services:   . Active Member of Clubs or Organizations:   . Attends Archivist Meetings:   Marland Kitchen Marital Status:   Intimate Partner Violence:   . Fear of Current or Ex-Partner:   . Emotionally Abused:   Marland Kitchen Physically Abused:   . Sexually Abused:      Allergies  Allergen Reactions  . Penicillins Hives and Swelling    REACTION: swelling of tongue; hives Did it involve swelling of the face/tongue/throat, SOB, or low BP? Yes Did it involve sudden or severe rash/hives, skin peeling, or any reaction on the inside of your mouth or nose? No Did you need to seek medical attention at a hospital or doctor's office? Yes When did it last happen?~2014 If all above answers are "NO", may proceed with cephalosporin use.      Outpatient Medications Prior to Visit  Medication Sig Dispense Refill  . amLODipine (NORVASC) 5 MG tablet Take 1 tablet (5 mg total) by mouth daily. 90 tablet 3  . Blood Pressure Monitor DEVI Please provide patient with insurance approved blood pressure monitor 1 each 0  . Multiple Vitamin (MULTIVITAMIN WITH MINERALS) TABS tablet Take 1 tablet by mouth daily. Women's One-A-Day Mulitvitamin    . umeclidinium-vilanterol (ANORO ELLIPTA) 62.5-25 MCG/INH AEPB Inhale 1 puff into the lungs daily. 60 each 0   No facility-administered medications prior to visit.    Review of Systems  Constitutional: Negative for chills, fever, malaise/fatigue and weight loss.  HENT: Negative for hearing loss, sore throat and  tinnitus.   Eyes: Negative for blurred vision and double vision.  Respiratory: Negative for cough, hemoptysis, sputum production, shortness of breath, wheezing and stridor.   Cardiovascular: Negative for chest pain, palpitations, orthopnea, leg swelling and PND.  Gastrointestinal: Negative for abdominal pain, constipation, diarrhea, heartburn, nausea and vomiting.  Genitourinary: Negative for dysuria, hematuria and urgency.  Musculoskeletal: Negative for joint pain and myalgias.  Skin: Negative for itching and rash.  Neurological: Negative for dizziness, tingling, weakness and headaches.  Endo/Heme/Allergies: Negative for environmental allergies. Does not bruise/bleed easily.  Psychiatric/Behavioral: Negative for depression. The patient is not nervous/anxious and does not have insomnia.   All other systems reviewed and are negative.    Objective:  Physical  Exam Vitals reviewed.  Constitutional:      General: She is not in acute distress.    Appearance: She is well-developed.  HENT:     Head: Normocephalic and atraumatic.     Mouth/Throat:     Pharynx: No oropharyngeal exudate.  Eyes:     Conjunctiva/sclera: Conjunctivae normal.     Pupils: Pupils are equal, round, and reactive to light.  Neck:     Vascular: No JVD.     Trachea: No tracheal deviation.     Comments: Loss of supraclavicular fat Cardiovascular:     Rate and Rhythm: Normal rate and regular rhythm.     Heart sounds: S1 normal and S2 normal.     Comments: Distant heart tones Pulmonary:     Effort: No tachypnea or accessory muscle usage.     Breath sounds: No stridor. Decreased breath sounds (throughout all lung fields) present. No wheezing, rhonchi or rales.  Abdominal:     General: Bowel sounds are normal. There is no distension.     Palpations: Abdomen is soft.     Tenderness: There is no abdominal tenderness.  Musculoskeletal:        General: Deformity (muscle wasting ) present.  Skin:    General: Skin is  warm and dry.     Capillary Refill: Capillary refill takes less than 2 seconds.     Findings: No rash.  Neurological:     Mental Status: She is alert and oriented to person, place, and time.  Psychiatric:        Behavior: Behavior normal.      Vitals:   08/25/19 0949  BP: 112/76  Pulse: 67  Temp: (!) 97 F (36.1 C)  TempSrc: Temporal  SpO2: 99%  Weight: 126 lb 12.8 oz (57.5 kg)  Height: 5\' 4"  (1.626 m)   99% on RA BMI Readings from Last 3 Encounters:  08/25/19 21.77 kg/m  08/16/19 22.31 kg/m  08/06/19 22.31 kg/m   Wt Readings from Last 3 Encounters:  08/25/19 126 lb 12.8 oz (57.5 kg)  08/16/19 130 lb (59 kg)  08/06/19 130 lb (59 kg)     CBC    Component Value Date/Time   WBC 4.5 05/05/2019 1355   WBC 9.5 06/28/2018 1251   RBC 3.91 05/05/2019 1355   RBC 4.01 06/28/2018 1251   HGB 13.7 05/05/2019 1355   HCT 38.9 05/05/2019 1355   PLT 130 (L) 05/05/2019 1355   MCV 100 (H) 05/05/2019 1355   MCH 35.0 (H) 05/05/2019 1355   MCH 33.9 06/28/2018 1251   MCHC 35.2 05/05/2019 1355   MCHC 34.9 06/28/2018 1251   RDW 12.8 05/05/2019 1355   LYMPHSABS 1.8 06/28/2018 1251   MONOABS 0.5 06/28/2018 1251   EOSABS 0.0 06/28/2018 1251   BASOSABS 0.0 06/28/2018 1251    Chest Imaging: Oct 2020 LDCT: RUL nodule, very peripheral. The patient's images have been independently reviewed by me.   Feb 2021 LDCT: RUL nodule, peripheral, still persistent, The patient's images have been independently reviewed by me.    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 The patient's images have been independently reviewed by me.    Pulmonary Functions Testing Results: PFT Results Latest Ref Rng & Units 07/15/2019  FVC-Pre L 2.94  FVC-Predicted Pre % 109  FVC-Post L 2.85  FVC-Predicted Post % 106  Pre FEV1/FVC % % 71  Post FEV1/FCV % % 71  FEV1-Pre L 2.09  FEV1-Predicted Pre  % 98  FEV1-Post L 2.04  DLCO UNC% % 76  DLCO COR %Predicted % 84  TLC L 8.44  TLC % Predicted % 167  RV % Predicted % 281    FeNO: None   Pathology: none   Echocardiogram: None   Heart Catheterization: none     Assessment & Plan:     ICD-10-CM   1. Lung nodule  R91.1   2. Abnormal PET of right lung  R94.2   3. Tobacco abuse  Z72.0   4. Abnormal CT scan of lung  R91.8   5. Moderate cigarette smoker (10-19 per day)  F17.210      Discussion:  59 year old female, significant smoking history, current smoker unable to quit at this time.  With a persistent right upper lobe peripheral based medial nodule just posterior to the lateral border of the sternum.  We discussed her images today in the office as well as her recent cancellation of the surgical procedure.  I explained that this lesion is a likely best accessed percutaneous due to its peripheral nature.   Plan: We will discuss case at medical thoracic oncology conference. If interventional radiology feels this is accessible we will proceed with referral to interventional radiology. If they do not believe this is accessible we will attempt electromagnetic navigational bronchoscopy with tissue sampling and likely fiducial placement.  Pending MTOC discussion we will make plans   RTC in 3 months  Greater than 50% of this patient's 21-minute office spent face-to-face discussing above recommendations and treatment plan.   Current Outpatient Medications:  .  amLODipine (NORVASC) 5 MG tablet, Take 1 tablet (5 mg total) by mouth daily., Disp: 90 tablet, Rfl: 3 .  Blood Pressure Monitor DEVI, Please provide patient with insurance approved blood pressure monitor, Disp: 1 each, Rfl: 0 .  Multiple Vitamin (MULTIVITAMIN WITH MINERALS) TABS tablet, Take 1 tablet by mouth daily. Women's One-A-Day Mulitvitamin, Disp: , Rfl:  .  umeclidinium-vilanterol (ANORO ELLIPTA) 62.5-25 MCG/INH AEPB, Inhale 1 puff into the lungs daily., Disp: 60 each,  Rfl: 0   Garner Nash, DO [facility] 08/25/2019 10:08 AM

## 2019-08-25 NOTE — Patient Instructions (Signed)
Thank you for visiting Dr. Valeta Harms at [facility]. Today we recommend the following:  Plans to discuss at [facility] on Thursday I will call you with plans Expect an appt to be scheduled for a biopsy.   Return in about 3 months (around 11/24/2019) for w/ Dr. Valeta Harms .    Please do your part to reduce the spread of COVID-19.

## 2019-08-26 ENCOUNTER — Other Ambulatory Visit: Payer: Self-pay | Admitting: *Deleted

## 2019-08-26 NOTE — Progress Notes (Signed)
The proposed treatment discussed in cancer conference 08/26/19 is for discussion purpose only and is not a binding recommendation.  The patient was not physically examined nor present for their treatment options.  Therefore, final treatment plans cannot be decided.  

## 2019-09-09 ENCOUNTER — Telehealth: Payer: Self-pay | Admitting: Pulmonary Disease

## 2019-09-09 DIAGNOSIS — R911 Solitary pulmonary nodule: Secondary | ICD-10-CM

## 2019-09-09 NOTE — Telephone Encounter (Signed)
LMTCB x1 for pt.  

## 2019-09-10 NOTE — Telephone Encounter (Signed)
Pt called back to schedule this. Please return call.  [phone] please call in the morning if possible.

## 2019-09-10 NOTE — Telephone Encounter (Signed)
LMTCB x2 for pt 

## 2019-09-10 NOTE — Telephone Encounter (Signed)
PCCM:  Orders placed for CT guided biopsy. Patient was unwilling to have bronchoscopy before and cancelled her surgery that was previously scheduled.  Hopefully IR will be able to access.   Route to [facility] to help schedule CT guided biopsy   Garner Nash, DO [facility] 09/10/2019 10:55 AM

## 2019-09-10 NOTE — Telephone Encounter (Signed)
Spoke with pt. She is wanting to get the biopsy scheduled that she discussed with Dr. Valeta Harms at her last OV.  Dr. Valeta Harms - please advise. Thanks.

## 2019-09-14 ENCOUNTER — Encounter (HOSPITAL_COMMUNITY): Payer: Self-pay

## 2019-09-14 NOTE — Progress Notes (Unsigned)
[name]. [name] Female, 59 y.o., 1960/11/02 MRN:  [id number] Phone:  [phone] (H) PCP:  Gildardo Pounds, NP Coverage:  Medicaid [place]/Medicaid [facility] ([facility]) 09/23/2019 at 11:00 AM RE: Biopsy Received: [place], MD  [name]      Ok   CT core ant PET+ RUL SPN   Per MD: Patient was unwilling to have bronchoscopy before and cancelled her surgery that was previously scheduled.  Hopefully IR will be able to access.    Route to [facility] to help schedule CT guided biopsy    Garner Nash, DO  [facility]  09/10/2019 10:55 AM    DD[facility]   Previous Messages  ----- Message -----  From: [name]  Sent: 09/10/2019  2:33 PM EDT  To: Ir Procedure Requests  Subject: Biopsy                      Procedure Requested: CT Biopsy    Reason for Procedure: Lung nodule    Provider Requesting:  Dr. Garner Nash  Provider Telephone:  [phone]    Other Info: Rad exams in Epic

## 2019-09-20 ENCOUNTER — Other Ambulatory Visit (HOSPITAL_COMMUNITY)
Admission: RE | Admit: 2019-09-20 | Discharge: 2019-09-20 | Disposition: A | Discharge status: Home / Self Care | Payer: Medicaid Other | Source: Ambulatory Visit | Attending: Pulmonary Disease | Admitting: Pulmonary Disease

## 2019-09-20 DIAGNOSIS — Z01812 Encounter for preprocedural laboratory examination: Secondary | ICD-10-CM | POA: Insufficient documentation

## 2019-09-20 DIAGNOSIS — Z20822 Contact with and (suspected) exposure to covid-19: Secondary | ICD-10-CM | POA: Insufficient documentation

## 2019-09-20 LAB — SARS CORONAVIRUS 2 (TAT 6-24 HRS): SARS Coronavirus 2: NEGATIVE

## 2019-09-22 ENCOUNTER — Other Ambulatory Visit: Payer: Self-pay | Admitting: Radiology

## 2019-09-23 ENCOUNTER — Other Ambulatory Visit: Payer: Self-pay

## 2019-09-23 ENCOUNTER — Ambulatory Visit (HOSPITAL_COMMUNITY)
Admission: RE | Admit: 2019-09-23 | Discharge: 2019-09-23 | Disposition: A | Discharge status: Home / Self Care | Payer: Medicaid Other | Source: Ambulatory Visit | Attending: Interventional Radiology | Admitting: Interventional Radiology

## 2019-09-23 ENCOUNTER — Encounter (HOSPITAL_COMMUNITY): Payer: Self-pay

## 2019-09-23 ENCOUNTER — Ambulatory Visit (HOSPITAL_COMMUNITY)
Admission: RE | Admit: 2019-09-23 | Discharge: 2019-09-23 | Disposition: A | Discharge status: Home / Self Care | Payer: Medicaid Other | Source: Ambulatory Visit | Attending: Pulmonary Disease | Admitting: Pulmonary Disease

## 2019-09-23 DIAGNOSIS — C8519 Unspecified B-cell lymphoma, extranodal and solid organ sites: Secondary | ICD-10-CM | POA: Insufficient documentation

## 2019-09-23 DIAGNOSIS — I700 Atherosclerosis of aorta: Secondary | ICD-10-CM | POA: Diagnosis not present

## 2019-09-23 DIAGNOSIS — I10 Essential (primary) hypertension: Secondary | ICD-10-CM | POA: Insufficient documentation

## 2019-09-23 DIAGNOSIS — J439 Emphysema, unspecified: Secondary | ICD-10-CM | POA: Insufficient documentation

## 2019-09-23 DIAGNOSIS — R911 Solitary pulmonary nodule: Secondary | ICD-10-CM

## 2019-09-23 DIAGNOSIS — Z79899 Other long term (current) drug therapy: Secondary | ICD-10-CM | POA: Diagnosis not present

## 2019-09-23 DIAGNOSIS — F17210 Nicotine dependence, cigarettes, uncomplicated: Secondary | ICD-10-CM | POA: Insufficient documentation

## 2019-09-23 DIAGNOSIS — F209 Schizophrenia, unspecified: Secondary | ICD-10-CM | POA: Insufficient documentation

## 2019-09-23 DIAGNOSIS — F319 Bipolar disorder, unspecified: Secondary | ICD-10-CM | POA: Insufficient documentation

## 2019-09-23 DIAGNOSIS — J939 Pneumothorax, unspecified: Secondary | ICD-10-CM | POA: Diagnosis not present

## 2019-09-23 DIAGNOSIS — K5730 Diverticulosis of large intestine without perforation or abscess without bleeding: Secondary | ICD-10-CM | POA: Insufficient documentation

## 2019-09-23 DIAGNOSIS — Z9889 Other specified postprocedural states: Secondary | ICD-10-CM

## 2019-09-23 LAB — PROTIME-INR
INR: 1 (ref 0.8–1.2)
Prothrombin Time: 12.3 seconds (ref 11.4–15.2)

## 2019-09-23 LAB — CBC
HCT: 40.3 % (ref 36.0–46.0)
Hemoglobin: 13.9 g/dL (ref 12.0–15.0)
MCH: 35.4 pg — ABNORMAL HIGH (ref 26.0–34.0)
MCHC: 34.5 g/dL (ref 30.0–36.0)
MCV: 102.5 fL — ABNORMAL HIGH (ref 80.0–100.0)
Platelets: 130 10*3/uL — ABNORMAL LOW (ref 150–400)
RBC: 3.93 MIL/uL (ref 3.87–5.11)
RDW: 13.3 % (ref 11.5–15.5)
WBC: 3.3 10*3/uL — ABNORMAL LOW (ref 4.0–10.5)
nRBC: 0 % (ref 0.0–0.2)

## 2019-09-23 MED ORDER — FENTANYL CITRATE (PF) 100 MCG/2ML IJ SOLN
INTRAMUSCULAR | Status: AC
  Filled 2019-09-23: qty 2

## 2019-09-23 MED ORDER — MIDAZOLAM HCL 2 MG/2ML IJ SOLN
INTRAMUSCULAR | Status: AC | PRN
  Administered 2019-09-23: 1 mg via INTRAVENOUS
  Administered 2019-09-23 (×2): 0.5 mg via INTRAVENOUS

## 2019-09-23 MED ORDER — IOHEXOL 350 MG/ML SOLN: 100.0000 mL | Freq: Once | INTRAVENOUS | Status: DC | PRN

## 2019-09-23 MED ORDER — MIDAZOLAM HCL 2 MG/2ML IJ SOLN
INTRAMUSCULAR | Status: AC
  Filled 2019-09-23: qty 2

## 2019-09-23 MED ORDER — SODIUM CHLORIDE 0.9 % IV SOLN: INTRAVENOUS | Status: DC

## 2019-09-23 MED ORDER — FENTANYL CITRATE (PF) 100 MCG/2ML IJ SOLN
INTRAMUSCULAR | Status: AC | PRN
  Administered 2019-09-23: 50 ug via INTRAVENOUS
  Administered 2019-09-23: 25 ug via INTRAVENOUS

## 2019-09-23 MED ORDER — HYDROCODONE-ACETAMINOPHEN 5-325 MG PO TABS: 1.0000 | ORAL_TABLET | ORAL | Status: DC | PRN

## 2019-09-23 NOTE — H&P (Addendum)
Chief Complaint: Patient was seen in consultation today for right lung mass biopsy at the request of [place] [facility]  Referring Physician(s): [place] [facility]  Supervising Physician: Jacqulynn Cadet  Patient Status: [facility] - Out-pt  History of Present Illness: [name] is a 59 y.o. female   HTN; Bipolar dz; schizophrenia ++ smoker Enrolled in Cancer screening with [facility] 02/2019 +RUL lesion noted Repeat imaging revealed enlarging mass 06/2019  PET 07/19/19: IMPRESSION: 1. The irregular medial right upper lobe nodule shown on recent CT persists and has a maximum SUV of 2.5. 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 Tissue diagnosis suggested. 2. Emphysema with some interstitial accentuation along some of the clustered regions of acquired cystic lung disease. None of these demonstrate accentuated metabolic activity. These are likely inflammatory but periodic surveillance is suggested. 3. Colon activity is most likely physiologic, although correlation with the patient's colon screening history is recommended in determining whether further workup of the colon is indicated. 4. Other imaging findings of potential clinical significance: Aortic Atherosclerosis (ICD10-I70.0). Sigmoid colon diverticulosis. Emphysema (ICD10-J43.9).  Was scheduled for procedure with Dr Kipp Brood and Dr Valeta Harms 08/23/19-- pt cancelled procedure secondary anxiety/fear. Discussed at [place] Dr Valeta Harms requesting needle biopsy of lesion Approved by Dr Vernard Gambles  Scheduled now for same    Past Medical History:  Diagnosis Date  . Anemia 1981   During chilbirth  . Bipolar 1 disorder ([place])   . Depression   . Hypertension   . Mental disorder   . Schizophrenia [facility])     Past Surgical History:  Procedure Laterality Date  . NECK SURGERY     cyst  . TUBAL LIGATION      Allergies: Penicillins  Medications: Prior to  Admission medications   Medication Sig Start Date End Date Taking? Authorizing Provider  amLODipine (NORVASC) 5 MG tablet Take 1 tablet (5 mg total) by mouth daily. 04/20/19  Yes Gildardo Pounds, NP  Multiple Vitamin (MULTIVITAMIN WITH MINERALS) TABS tablet Take 1 tablet by mouth daily. Women's One-A-Day Mulitvitamin   Yes [provider]  umeclidinium-vilanterol (ANORO ELLIPTA) 62.5-25 MCG/INH AEPB Inhale 1 puff into the lungs daily. 07/08/19  Yes Icard, Octavio Graves, DO  Blood Pressure Monitor DEVI Please provide patient with insurance approved blood pressure monitor 04/20/19   Gildardo Pounds, NP     Family History  Problem Relation Age of Onset  . Breast cancer Maternal Aunt   . Breast cancer Maternal Aunt   . Breast cancer Maternal Aunt   . Diabetes Mother     Social History   Socioeconomic History  . Marital status: Divorced    Spouse name: Not on file  . Number of children: 3  . Years of education: Not on file  . Highest education level: 9th grade  Occupational History  . Not on file  Tobacco Use  . Smoking status: Current Every Day Smoker    Packs/day: 0.75    Years: 42.00    Pack years: 31.50    Types: Cigarettes  . Smokeless tobacco: Never Used  . Tobacco comment: pt smoking about 2 cig per day  Substance and Sexual Activity  . Alcohol use: Yes    Alcohol/week: 4.0 standard drinks    Types: 4 Cans of beer per week    Comment: every other day  . Drug use: Yes    Frequency: 2.0 times per week    Types: Marijuana    Comment: THC  about once per week  . Sexual activity: Yes    Birth control/protection: Surgical  Other Topics Concern  . Not on file  Social History Narrative  . Not on file   Social Determinants of Health   Financial Resource Strain:   . Difficulty of Paying Living Expenses:   Food Insecurity:   . Worried About Charity fundraiser in the Last Year:   . Arboriculturist in the Last Year:   Transportation Needs: No Transportation Needs  .  Lack of Transportation (Medical): No  . Lack of Transportation (Non-Medical): No  Physical Activity:   . Days of Exercise per Week:   . Minutes of Exercise per Session:   Stress:   . Feeling of Stress :   Social Connections:   . Frequency of Communication with Friends and Family:   . Frequency of Social Gatherings with Friends and Family:   . Attends Religious Services:   . Active Member of Clubs or Organizations:   . Attends Archivist Meetings:   Marland Kitchen Marital Status:       Review of Systems: A 12 point ROS discussed and pertinent positives are indicated in the HPI above.  All other systems are negative.  Review of Systems  Constitutional: Negative for activity change, fatigue and unexpected weight change.  Respiratory: Negative for cough and shortness of breath.   Gastrointestinal: Negative for abdominal pain.  Psychiatric/Behavioral: Negative for behavioral problems and confusion.    Vital Signs: BP (!) 152/108   Pulse 68   Temp (!) 97.5 F (36.4 C) (Skin)   Resp 18   Ht 5\' 4"  (1.626 m)   Wt 126 lb (57.2 kg)   LMP 10/25/2012   SpO2 100%   BMI 21.63 kg/m   Physical Exam Vitals reviewed.  Cardiovascular:     Rate and Rhythm: Normal rate and regular rhythm.     Heart sounds: Normal heart sounds.  Pulmonary:     Effort: Pulmonary effort is normal.     Breath sounds: Normal breath sounds.  Abdominal:     Palpations: Abdomen is soft.  Musculoskeletal:        General: Normal range of motion.  Skin:    General: Skin is warm and dry.  Neurological:     Mental Status: She is alert and oriented to person, place, and time.  Psychiatric:        Mood and Affect: Mood normal.        Behavior: Behavior normal.        Thought Content: Thought content normal.        Judgment: Judgment normal.     Imaging: No results found.  Labs:  CBC: Recent Labs    05/05/19 1355  WBC 4.5  HGB 13.7  HCT 38.9  PLT 130*    COAGS: No results for input(s): INR,  APTT in the last 8760 hours.  BMP: Recent Labs    05/05/19 1355  NA 139  K 4.4  CL 97  CO2 24  GLUCOSE 70  BUN 6  CALCIUM 10.2  CREATININE 0.67  GFRNONAA 97  GFRAA 111    LIVER FUNCTION TESTS: Recent Labs    05/05/19 1355  BILITOT 0.4  AST 130*  ALT 50*  ALKPHOS 82  PROT 8.0  ALBUMIN 5.0*    TUMOR MARKERS: No results for input(s): AFPTM, CEA, CA199, CHROMGRNA in the last 8760 hours.  Assessment and Plan:  RUL mass Enlarging and +PET ++ smoker Intended  for Pulmonary procedure-- but pt cancelled 08/2019 Now request for needle bx per Dr Valeta Harms Risks and benefits of CT guided lung nodule biopsy was discussed with the patient including, but not limited to bleeding, hemoptysis, respiratory failure requiring intubation, infection, pneumothorax requiring chest tube placement, stroke from air embolism or even death.  All of the patient's questions were answered and the patient is agreeable to proceed. Consent signed and in chart.   Thank you for this interesting consult.  I greatly enjoyed meeting [facility] and look forward to participating in their care.  A copy of this report was sent to the requesting provider on this date.  Electronically Signed: Lavonia Drafts, PA-C 09/23/2019, 10:37 AM   I spent a total of  30 Minutes   in face to face in clinical consultation, greater than 50% of which was counseling/coordinating care for RUL mass bx

## 2019-09-23 NOTE — Progress Notes (Signed)
Per Dr Laurence Ferrari pt may be DC,d

## 2019-09-23 NOTE — Procedures (Signed)
Interventional Radiology Procedure Note  Procedure: CT guided biopsy of RUL pulmonary nodule.  Complications: Small PTX Recommendations: - Bedrest until CXR cleared.  Minimize talking, coughing or otherwise straining.  - Follow up 2 hr CXR pending   Signed,  Criselda Peaches, MD

## 2019-09-23 NOTE — Discharge Instructions (Signed)
Lung Biopsy, Care After  This sheet gives you information about how to care for yourself after your procedure. Your health care provider may also give you more specific instructions depending on the type of biopsy you had. If you have problems or questions, contact your health care provider.  What can I expect after the procedure?  After the procedure, it is common to have:  · A cough.  · A sore throat.  · Pain where a needle, bronchoscope, or incision was used to collect a biopsy sample (biopsy site).  Follow these instructions at home:  Medicines  · Take over-the-counter and prescription medicines only as told by your health care provider.  · Do not drink alcohol if your health care provider tells you not to drink.  · Ask your health care provider if the medicine prescribed to you:  ? Requires you to avoid driving or using heavy machinery.  ? Can cause constipation. You may need to take these actions to prevent or treat constipation:  § Drink enough fluid to keep your urine pale yellow.  § Take over-the-counter or prescription medicines.  § Eat foods that are high in fiber, such as beans, whole grains, and fresh fruits and vegetables.  § Limit foods that are high in fat and processed sugars, such as fried or sweet foods.  · Do not drive for 24 hours if you were given a sedative.  Biopsy site care    · Follow instructions from your health care provider about how to take care of your biopsy site. Make sure you:  ? Wash your hands with soap and water before and after you change your bandage (dressing). If soap and water are not available, use hand sanitizer.  ? Change your dressing as told by your health care provider.  ? Leave stitches (sutures), skin glue, or adhesive strips in place. These skin closures may need to stay in place for 2 weeks or longer. If adhesive strip edges start to loosen and curl up, you may trim the loose edges. Do not remove adhesive strips completely unless your health care provider tells  you to do that.  · Do not take baths, swim, or use a hot tub until your health care provider approves. Ask your health care provider if you may take showers. You may only be allowed to take sponge baths.  · Check your biopsy site every day for signs of infection. Check for:  ? Redness, swelling, or more pain.  ? Fluid or blood.  ? Warmth.  ? Pus or a bad smell.  General instructions  · Return to your normal activities as told by your health care provider. Ask your health care provider what activities are safe for you.  · It is up to you to get the results of your procedure. Ask your health care provider, or the department that is doing the procedure, when your results will be ready.  · Keep all follow-up visits as told by your health care provider. This is important.  Contact a health care provider if:  · You have a fever.  · You have redness, swelling, or more pain around your biopsy site.  · You have fluid or blood coming from your biopsy site.  · Your biopsy site feels warm to the touch.  · You have pus or a bad smell coming from your biopsy site.  · You have pain that does not get better with medicine.  Get help right away if:  · You   cough up blood.  · You have trouble breathing.  · You have chest pain.  · You lose consciousness.  Summary  · After the procedure, it is common to have a sore throat and a cough.  · Return to your normal activities as told by your health care provider. Ask your health care provider what activities are safe for you.  · Take over-the-counter and prescription medicines only as told by your health care provider.  · Report any unusual symptoms to your health care provider.  This information is not intended to replace advice given to you by your health care provider. Make sure you discuss any questions you have with your health care provider.  Document Revised: 06/03/2018 Document Reviewed: 05/28/2016  [employer]vier Patient Education © 2020 [employer]

## 2019-09-27 ENCOUNTER — Telehealth: Payer: Self-pay | Admitting: Pulmonary Disease

## 2019-09-27 DIAGNOSIS — C884 Extranodal marginal zone B-cell lymphoma of mucosa-associated lymphoid tissue [MALT-lymphoma]: Secondary | ICD-10-CM

## 2019-09-27 LAB — SURGICAL PATHOLOGY

## 2019-09-27 NOTE — Telephone Encounter (Signed)
PCCM:  I was called by pathology.   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  CC: Lightfoot and Lorre Nick, DO [facility] 09/27/2019 3:01 PM

## 2019-09-29 ENCOUNTER — Encounter: Payer: Self-pay | Admitting: *Deleted

## 2019-09-29 NOTE — Telephone Encounter (Signed)
Will update new patient coordinator

## 2019-09-29 NOTE — Progress Notes (Signed)
Per Dr. Julien Nordmann, I notified new patient scheduler to call Ms. [name] to set up an appt with either Dr. Lorenso Courier or Dr. Irene Limbo due to dx of lymphoma.

## 2019-09-29 NOTE — Telephone Encounter (Signed)
Yes. Irene Limbo or Lorenso Courier. No treatment really needed.

## 2019-09-29 NOTE — Telephone Encounter (Signed)
Dr. Julien Nordmann, would you like this patient to be seen by you or Dr. Irene Limbo?

## 2019-09-30 ENCOUNTER — Ambulatory Visit: Payer: Medicaid Other | Admitting: Cardiothoracic Surgery

## 2019-10-01 ENCOUNTER — Telehealth: Payer: Self-pay | Admitting: Hematology and Oncology

## 2019-10-01 NOTE — Telephone Encounter (Signed)
Received a new patient referral from Dr. Valeta Harms at [facility] for low grade non-Hodgkin B-cell lymphoma. [name] has been scheduled to see Dr. Lorenso Courier on 5/27 at 8am. Appt date and time was given to the pt's daughter. Aware to arrive 15 minutes early.

## 2019-10-07 ENCOUNTER — Inpatient Hospital Stay: Payer: Medicaid Other

## 2019-10-07 ENCOUNTER — Encounter: Payer: Self-pay | Admitting: Hematology and Oncology

## 2019-10-07 ENCOUNTER — Other Ambulatory Visit: Payer: Self-pay

## 2019-10-07 ENCOUNTER — Inpatient Hospital Stay: Payer: Medicaid Other | Attending: Hematology and Oncology | Admitting: Hematology and Oncology

## 2019-10-07 VITALS — BP 129/90 | HR 70 | Temp 97.3°F | Resp 18 | Ht 64.0 in | Wt 127.3 lb

## 2019-10-07 DIAGNOSIS — I10 Essential (primary) hypertension: Secondary | ICD-10-CM | POA: Insufficient documentation

## 2019-10-07 DIAGNOSIS — F17210 Nicotine dependence, cigarettes, uncomplicated: Secondary | ICD-10-CM | POA: Insufficient documentation

## 2019-10-07 DIAGNOSIS — R634 Abnormal weight loss: Secondary | ICD-10-CM | POA: Diagnosis not present

## 2019-10-07 DIAGNOSIS — R232 Flushing: Secondary | ICD-10-CM | POA: Insufficient documentation

## 2019-10-07 DIAGNOSIS — J439 Emphysema, unspecified: Secondary | ICD-10-CM | POA: Diagnosis not present

## 2019-10-07 DIAGNOSIS — C884 Extranodal marginal zone B-cell lymphoma of mucosa-associated lymphoid tissue [MALT-lymphoma]: Secondary | ICD-10-CM

## 2019-10-07 DIAGNOSIS — Z79899 Other long term (current) drug therapy: Secondary | ICD-10-CM | POA: Insufficient documentation

## 2019-10-07 DIAGNOSIS — R630 Anorexia: Secondary | ICD-10-CM | POA: Insufficient documentation

## 2019-10-07 DIAGNOSIS — R911 Solitary pulmonary nodule: Secondary | ICD-10-CM | POA: Insufficient documentation

## 2019-10-07 DIAGNOSIS — F209 Schizophrenia, unspecified: Secondary | ICD-10-CM | POA: Diagnosis not present

## 2019-10-07 DIAGNOSIS — F319 Bipolar disorder, unspecified: Secondary | ICD-10-CM | POA: Insufficient documentation

## 2019-10-07 LAB — CBC WITH DIFFERENTIAL (CANCER CENTER ONLY)
Abs Immature Granulocytes: 0.01 10*3/uL (ref 0.00–0.07)
Basophils Absolute: 0 10*3/uL (ref 0.0–0.1)
Basophils Relative: 1 %
Eosinophils Absolute: 0 10*3/uL (ref 0.0–0.5)
Eosinophils Relative: 1 %
HCT: 37.8 % (ref 36.0–46.0)
Hemoglobin: 12.9 g/dL (ref 12.0–15.0)
Immature Granulocytes: 0 %
Lymphocytes Relative: 43 %
Lymphs Abs: 1.8 10*3/uL (ref 0.7–4.0)
MCH: 35 pg — ABNORMAL HIGH (ref 26.0–34.0)
MCHC: 34.1 g/dL (ref 30.0–36.0)
MCV: 102.4 fL — ABNORMAL HIGH (ref 80.0–100.0)
Monocytes Absolute: 0.3 10*3/uL (ref 0.1–1.0)
Monocytes Relative: 8 %
Neutro Abs: 1.9 10*3/uL (ref 1.7–7.7)
Neutrophils Relative %: 47 %
Platelet Count: 119 10*3/uL — ABNORMAL LOW (ref 150–400)
RBC: 3.69 MIL/uL — ABNORMAL LOW (ref 3.87–5.11)
RDW: 12.9 % (ref 11.5–15.5)
WBC Count: 4.1 10*3/uL (ref 4.0–10.5)
nRBC: 0 % (ref 0.0–0.2)

## 2019-10-07 LAB — CMP (CANCER CENTER ONLY)
ALT: 37 U/L (ref 0–44)
AST: 71 U/L — ABNORMAL HIGH (ref 15–41)
Albumin: 4 g/dL (ref 3.5–5.0)
Alkaline Phosphatase: 80 U/L (ref 38–126)
Anion gap: 12 (ref 5–15)
BUN: 8 mg/dL (ref 6–20)
CO2: 23 mmol/L (ref 22–32)
Calcium: 9.9 mg/dL (ref 8.9–10.3)
Chloride: 106 mmol/L (ref 98–111)
Creatinine: 0.83 mg/dL (ref 0.44–1.00)
GFR, Est AFR Am: >60 mL/min (ref >60)
GFR, Estimated: >60 mL/min (ref >60)
Glucose, Bld: 78 mg/dL (ref 70–99)
Potassium: 4.4 mmol/L (ref 3.5–5.1)
Sodium: 141 mmol/L (ref 135–145)
Total Bilirubin: 0.3 mg/dL (ref 0.3–1.2)
Total Protein: 8.3 g/dL — ABNORMAL HIGH (ref 6.5–8.1)

## 2019-10-07 LAB — HEPATITIS B SURFACE ANTIBODY,QUALITATIVE: Hep B S Ab: REACTIVE — AB

## 2019-10-07 LAB — HEPATITIS B CORE ANTIBODY, TOTAL: Hep B Core Total Ab: REACTIVE — AB

## 2019-10-07 LAB — HEPATITIS B SURFACE ANTIGEN: Hepatitis B Surface Ag: NONREACTIVE

## 2019-10-07 LAB — HEPATITIS C ANTIBODY: HCV Ab: NONREACTIVE

## 2019-10-07 LAB — HIV ANTIBODY (ROUTINE TESTING W REFLEX): HIV Screen 4th Generation wRfx: NONREACTIVE

## 2019-10-07 LAB — SEDIMENTATION RATE: Sed Rate: 15 mm/hr (ref 0–22)

## 2019-10-07 NOTE — Progress Notes (Signed)
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 PET-CT suggested. 2) 07/19/2019: PET CT performed showed irregular medial right upper lobe nodule shown on recent CT persists and has a maximum SUV of 2.5. Additionally there was noted to be colon activity  3) 08/23/2019: Right Upper Lobe Wedge resection planned. Patient declined this procedure. 4)  09/23/2019: CT guided biopsy of the lesion performed, findings consistent with an extranodal marginal zone lymphoma of mucosal associated tissue (MALT) lymphoma is favored. 5) 10/07/2019: establish care with Dr. Lorenso [name]   CHIEF COMPLAINTS/PURPOSE OF CONSULTATION:  "pulmonary lymphoma "  HISTORY OF PRESENTING ILLNESS:  [name] 59 y.o. female with medical history significant for ETOH use, HTN, schizophrenia, and emphysema who presents for evaluation of a newly diagnosed pulmonary lymphoma.   On review of the previous records his last go was initially found to have a lung lesion on 06/18/2019 while receiving a screening lung CT for her smoking history.  She was noted to have a long RADS 4A lesion suspicious for malignancy.  A PET CT scan was recommended.  On 07/19/2019 the patient had a PET CT scan which showed an irregular medial right upper lobe nodule with an SUV of 2.5.  There is also noted to be some colon activity reported at that time but it was unclear whether not this was physiologic or of clinical significance.  On 08/23/2019 the patient was scheduled for a right upper lobe wedge resection, however the patient declined this procedure due to fear of complications.  On 09/23/2019 a CT-guided biopsy of the lesion was performed and findings were consistent with  extranodal marginal zone lymphoma.  Given these findings the patient was referred to hematology and oncology for further evaluation management.  On exam today [name] notes that she has been overall well since her diagnosis.  She reports that she has been having some episodes of chills and sweats as well as occasional hot flashes, but these may be due to going through menopause.  She reports that she did stop her menstrual cycles approximately 3 years ago and that she was having hot flashes at that time, but that they "never stopped".  The patient is an active smoker at this time and is down to 3 cigarettes/day.  She notes that she is trying to quit entirely.  She reports that she has lost approximately 38 pounds within the last 1 year.  She endorses having poor appetite and not eating as much as she used to, but the weight loss is otherwise unintentional.  She has no remarkable family history for hematological malignancies.  She does note having a colonoscopy before but she is not sure who performed that her work-up was performed.  A full 10 point ROS is listed below.  MEDICAL HISTORY:  Past Medical History:  Diagnosis Date  . Anemia 1981   During chilbirth  . Bipolar 1 disorder ([place])   . Depression   . Hypertension   . Mental disorder   . Schizophrenia ([place])     SURGICAL HISTORY: Past Surgical History:  Procedure Laterality Date  . NECK SURGERY     cyst  . TUBAL LIGATION      SOCIAL HISTORY: Social History   Socioeconomic History  . Marital status: Divorced  Spouse name: Not on file  . Number of children: 3  . Years of education: Not on file  . Highest education level: 9th grade  Occupational History  . Not on file  Tobacco Use  . Smoking status: Current Every Day Smoker    Packs/day: 0.75    Years: 42.00    Pack years: 31.50    Types: Cigarettes  . Smokeless tobacco: Never Used  . Tobacco comment: pt smoking about 2 cig per day  Substance and Sexual Activity  . Alcohol  use: Yes    Alcohol/week: 4.0 standard drinks    Types: 4 Cans of beer per week    Comment: every other day  . Drug use: Yes    Frequency: 2.0 times per week    Types: Marijuana    Comment: THC about once per week  . Sexual activity: Yes    Birth control/protection: Surgical  Other Topics Concern  . Not on file  Social History Narrative  . Not on file   Social Determinants of Health   Financial Resource Strain:   . Difficulty of Paying Living Expenses:   Food Insecurity:   . Worried About Charity fundraiser in the Last Year:   . Arboriculturist in the Last Year:   Transportation Needs: No Transportation Needs  . Lack of Transportation (Medical): No  . Lack of Transportation (Non-Medical): No  Physical Activity:   . Days of Exercise per Week:   . Minutes of Exercise per Session:   Stress:   . Feeling of Stress :   Social Connections:   . Frequency of Communication with Friends and Family:   . Frequency of Social Gatherings with Friends and Family:   . Attends Religious Services:   . Active Member of Clubs or Organizations:   . Attends Archivist Meetings:   Marland Kitchen Marital Status:   Intimate Partner Violence:   . Fear of Current or Ex-Partner:   . Emotionally Abused:   Marland Kitchen Physically Abused:   . Sexually Abused:     FAMILY HISTORY: Family History  Problem Relation Age of Onset  . Breast cancer Maternal Aunt   . Breast cancer Maternal Aunt   . Breast cancer Maternal Aunt   . Diabetes Mother     ALLERGIES:  is allergic to penicillins.  MEDICATIONS:  Current Outpatient Medications  Medication Sig Dispense Refill  . amLODipine (NORVASC) 5 MG tablet Take 1 tablet (5 mg total) by mouth daily. 90 tablet 3  . Blood Pressure Monitor DEVI Please provide patient with insurance approved blood pressure monitor 1 each 0  . Multiple Vitamin (MULTIVITAMIN WITH MINERALS) TABS tablet Take 1 tablet by mouth daily. Women's One-A-Day Mulitvitamin    . umeclidinium-vilanterol  (ANORO ELLIPTA) 62.5-25 MCG/INH AEPB Inhale 1 puff into the lungs daily. 60 each 0   No current facility-administered medications for this visit.    REVIEW OF SYSTEMS:   Constitutional: ( - ) fevers, ( + )  chills , ( - ) night sweats Eyes: ( - ) blurriness of vision, ( - ) double vision, ( - ) watery eyes Ears, nose, mouth, throat, and face: ( - ) mucositis, ( - ) sore throat Respiratory: ( + ) cough, ( - ) dyspnea, ( - ) wheezes Cardiovascular: ( - ) palpitation, ( - ) chest discomfort, ( - ) lower extremity swelling Gastrointestinal:  ( - ) nausea, ( - ) heartburn, ( - ) change in bowel habits Skin: ( - )  abnormal skin rashes Lymphatics: ( - ) new lymphadenopathy, ( - ) easy bruising Neurological: ( - ) numbness, ( - ) tingling, ( - ) new weaknesses Behavioral/Psych: ( - ) mood change, ( - ) new changes  All other systems were reviewed with the patient and are negative.  PHYSICAL EXAMINATION: ECOG PERFORMANCE STATUS: 1 - Symptomatic but completely ambulatory  Vitals:   10/07/19 0843  BP: 129/90  Pulse: 70  Resp: 18  Temp: (!) 97.3 F (36.3 C)  SpO2: 100%   Filed Weights   10/07/19 0843  Weight: 127 lb 4.8 oz (57.7 kg)    GENERAL: well appearing middle aged [place] American female in NAD  SKIN: skin color, texture, turgor are normal, no rashes or significant lesions EYES: conjunctiva are pink and non-injected, sclera clear LUNGS: clear to auscultation and percussion with normal breathing effort HEART: regular rate & rhythm and no murmurs and no lower extremity edema ABDOMEN: soft, non-tender, non-distended, normal bowel sounds. No HSM appreciated.  Musculoskeletal: no cyanosis of digits and no clubbing  PSYCH: alert & oriented x 3, fluent speech NEURO: no focal motor/sensory deficits  LABORATORY DATA:  I have reviewed the data as listed CBC Latest Ref Rng & Units 10/07/2019 09/23/2019 05/05/2019  WBC 4.0 - 10.5 K/uL 4.1 3.3(L) 4.5  Hemoglobin 12.0 - 15.0 g/dL 12.9  13.9 13.7  Hematocrit 36.0 - 46.0 % 37.8 40.3 38.9  Platelets 150 - 400 K/uL 119(L) 130(L) 130(L)    CMP Latest Ref Rng & Units 10/07/2019 05/05/2019 06/28/2018  Glucose 70 - 99 mg/dL 78 70 99  BUN 6 - 20 mg/dL '8 6 6  ' Creatinine 0.44 - 1.00 mg/dL 0.83 0.67 0.73  Sodium 135 - 145 mmol/L 141 139 138  Potassium 3.5 - 5.1 mmol/L 4.4 4.4 3.3(L)  Chloride 98 - 111 mmol/L 106 97 101  CO2 22 - 32 mmol/L '23 24 22  ' Calcium 8.9 - 10.3 mg/dL 9.9 10.2 9.8  Total Protein 6.5 - 8.1 g/dL 8.3(H) 8.0 7.8  Total Bilirubin 0.3 - 1.2 mg/dL 0.3 0.4 0.9  Alkaline Phos 38 - 126 U/L 80 82 58  AST 15 - 41 U/L 71(H) 130(H) 20  ALT 0 - 44 U/L 37 50(H) 13     PATHOLOGY: SURGICAL PATHOLOGY  CASE: [id number]  PATIENT: Darel Hong  Surgical Pathology Report   Clinical History: PET avid RUL pulmonary nodule (nt)   FINAL MICROSCOPIC DIAGNOSIS:   A. 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   COMMENT:   Core biopsies reveal sheets of small atypical lymphocytes with slightly  irregular nuclear contours and moderate cytoplasm. The cells are  positive for CD20, bcl-2, and CD43. They are negative for CD5, CD10,  bcl-6, CD23, and CyclinD1. Light chain in situ hybridization reveals  both kappa and lambda positive plasma cells. CD3 highlights scattered  T-cells. Overall, these findings are consistent with a low grade B-cell  non-Hodgkin lymphoma. Given the lack of CD5 and CD10, as well as the  location, an extranodal marginal zone lymphoma of mucosal associated  tissue (MALT) lymphoma is favored. Dr. Valeta Harms was paged on 09/27/2019.   GROSS DESCRIPTION:   Received in formalin are 3 cores of soft tan-white tissue measuring 0.6  to 0.7 cm in length and each less than 0.1 cm in diameter. The specimen  is entirely submitted in 2 cassettes. [facility] 09/23/2019)   Final Diagnosis performed by Vicente Males, MD.  Electronically signed  09/27/2019  Technical  component performed at [employer]. [facility], [place] [address] [place], [place] [address].  Professional component performed at [facility],  [place] [address] [place], [place] [address].  Immunohistochemistry Technical component (if applicable) was performed  at [facility]. [address] [place],  [place], [place] [address].  IMMUNOHISTOCHEMISTRY DISCLAIMER (if applicable):  Some of these immunohistochemical stains may have been developed and the  performance characteristics determine by [facility]. Some  may not have been cleared or approved by the U.S. Food and Drug  Administration. The FDA has determined that such clearance or approval  is not necessary.This test is used for clinical purposes. It should not  be regarded as investigational or for research. This laboratory is  certified under the [place]  (CLIA-88) as qualified to perform high complexity clinical laboratory  testing. The controls stained appropriately.  RADIOGRAPHIC STUDIES: I have personally reviewed the radiological images as listed and agreed with the findings in the report: right upper lobe lung lesion, PET avid. Activity in the ascending colon, unclear if physiologic.   CT BIOPSY  Result Date: 09/24/2019 INDICATION: 59 year old female with bipolar 1 disorder and schizophrenia. She is an active smoker and has a metabolically active nodule in the medial and anterior aspect of the right upper lobe of the lung. She presents today for CT-guided biopsy. EXAM: CT-guided biopsy right upper lobe pulmonary nodule Interventional Radiologist:  Criselda Peaches, MD MEDICATIONS: None. ANESTHESIA/SEDATION: Fentanyl 100 mcg IV; Versed 2 mg IV Moderate Sedation Time:  15 minutes The patient was continuously monitored during the procedure by the interventional radiology nurse under my direct supervision. FLUOROSCOPY TIME:   None. COMPLICATIONS: None immediate. Estimated blood loss:  0 PROCEDURE: Informed written consent was obtained from the patient after a thorough discussion of the procedural risks, benefits and alternatives. All questions were addressed. Maximal Sterile Barrier Technique was utilized including caps, mask, sterile gowns, sterile gloves, sterile drape, hand hygiene and skin antiseptic. A timeout was performed prior to the initiation of the procedure. A planning axial CT scan was performed. The nodule in the right upper lobe was successfully identified. A suitable skin entry site was selected and marked. The region was then sterilely prepped and draped in standard fashion using Betadine skin prep. Local anesthesia was attained by infiltration with 1% lidocaine. A small dermatotomy was made. Under intermittent CT fluoroscopic guidance, a 17 gauge trocar needle was advanced into the lung and positioned at the margin of the nodule. Care was taken to avoid the internal mammary artery and vein. This was achieved successfully. Multiple 18 gauge core biopsies were then coaxially obtained using the BioPince automated biopsy device. Biopsy specimens were placed in formalin and delivered to pathology for further analysis. The biopsy device and introducer needle were removed. Post biopsy axial CT imaging demonstrates no evidence of immediate complication. There is a small anterior pneumothorax. The pneumothorax creates just enough displacement of the anterior and medial aspect of the right upper lobe to confirm that the nodule is contained completely within the lung and is not invasive or adherent to the adjacent mediastinum. The patient tolerated the procedure well. IMPRESSION: Technically successful CT-guided biopsy right upper lobe pulmonary nodule. Trace postprocedure pneumothorax creates just enough displacement of the anterior and medial aspect of the right upper lobe to confirm that the nodule is contained completely with  the lung and is not invasive or adherent to the adjacent anterior mediastinum.  Signed, Criselda Peaches, MD, [facility] Radiology Electronically Signed   By: Jacqulynn Cadet M.D.   On: 09/24/2019 12:00   DG Chest Port 1 View  Result Date: 09/23/2019 CLINICAL DATA:  Assess for pneumothorax post right upper lobe biopsy EXAM: PORTABLE CHEST 1 VIEW COMPARISON:  CT 08/16/2019, radiograph 08/06/2019 FINDINGS: There is a small right apical pneumothorax. Streaky opacities in the lung bases favor atelectasis. No consolidative opacity or convincing features of edema. No visible pleural effusion or left pneumothorax. Cardiomediastinal contours are stable. No acute osseous or soft tissue abnormality. Degenerative changes are present in the imaged spine and shoulders. IMPRESSION: Small right apical pneumothorax.  Basilar atelectasis. Electronically Signed   By: Lovena Le M.D.   On: 09/23/2019 14:54    ASSESSMENT & PLAN Katana Berthold Giannelli 59 y.o. female with medical history significant for ETOH use, HTN, schizophrenia, and emphysema who presents for evaluation of a newly diagnosed pulmonary lymphoma.  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  Assuming that the lesion in the lung is the only site of disease she would be considered to [place] stage I.  On the PET CT scan it is concerning there is an area of activity within the colon, though this may represent physiological uptake.  I would recommend GI evaluation in order to effectively rule out an additional site of MALT lymphoma.  There are no records of endoscopy in our system, the patient notes she has had colonoscopy previously.  I assume she was seen by the [facility] practice and we can make the referral back to them to try to reconnect the patient and obtain the prior records.  In terms of  treatment for this pulmonary extranodal MALT lymphoma this is an extraordinarily rare cancer and data is limited.  For early stage disease it is recommended that the patient undergo either surgical resection or radiation therapy.  Unfortunately these 2 modalities have not been tried head-to-head and therefore superiority has not been established.  After discussion with the patient she noted that she would prefer to avoid surgery.  Additionally given her extent of lung disease I would agree that surgery would be of high risk for this patient.  Given the indolent nature of this lymphoma I think it would be reasonable to proceed with radiation therapy and then if progression is noted to consider the addition of rituximab based therapy.  Radiation therapy is well-established as a safe and effective form of treatment for pulmonary extranodal MALT lymphoma (De[place]io, [place], [place] C. Pinnix, & Belia Heman. "Radiation therapy of extranodal marginal zone lymphomas." Annals of Lymphoma [Online], 4 (2020): n. pag. Web. 28 May. 2021).   # Pulmonary Extranodal MALT Lymphoma. [place] Stage I --findings are most consistent with a MALT lymphoma of the lung. Treatment of choice would consist of radiation therapy vs surgical resection. Patient favors radiation treatment at this time. --will refer to radiation oncology --area of physiological uptake in the colon is concerning, though nonspecific. Recommend touching base with her gastroenterologist to see when her last screening colon was done and if they would consider repeat colonoscopy to assess the mucosa of the ascending colon --baseline Hep B, Hep C, and HIV testing to be performed today (last done in 2011).  --baseline CBC, CMP, and ESR today --plan for f/u in clinic after radiation treatment is complete. If radiation therapy is not  feasible, will have her return sooner for consideration of systemic therapy. Placeholder appointment made in 2 months time.    Orders Placed This Encounter  Procedures  . CBC with Differential (Cancer Center Only)    Standing Status:   Future    Number of Occurrences:   1    Standing Expiration Date:   10/06/2020  . CMP ([place] only)    Standing Status:   Future    Number of Occurrences:   1    Standing Expiration Date:   10/06/2020  . Lactate dehydrogenase (LDH)    Standing Status:   Future    Number of Occurrences:   1    Standing Expiration Date:   10/06/2020  . Sedimentation rate    Standing Status:   Future    Number of Occurrences:   1    Standing Expiration Date:   10/06/2020  . Hepatitis B surface antibody    Standing Status:   Future    Number of Occurrences:   1    Standing Expiration Date:   10/06/2020  . Hepatitis B surface antigen    Standing Status:   Future    Number of Occurrences:   1    Standing Expiration Date:   10/06/2020  . Hepatitis B core antibody, total    Standing Status:   Future    Number of Occurrences:   1    Standing Expiration Date:   10/06/2020  . Hepatitis C antibody    Standing Status:   Future    Number of Occurrences:   1    Standing Expiration Date:   10/06/2020  . HIV antibody (with reflex)    Standing Status:   Future    Number of Occurrences:   1    Standing Expiration Date:   10/06/2020  . Ambulatory referral to Gastroenterology    Referral Priority:   Routine    Referral Type:   Consultation    Referral Reason:   Specialty Services Required    Number of Visits Requested:   1    All questions were answered. The patient knows to call the clinic with any problems, questions or concerns.  A total of more than 60 minutes were spent on this encounter and over half of that time was spent on counseling and coordination of care as outlined above.   Ledell Peoples, MD Department of Hematology/Oncology [place] at [facility] Phone: [phone] Pager: [phone] Email: [email or web address]  10/07/2019 7:13 PM     Deantonio, Josepha Pigg C. Pinnix, & Belia Heman. "Radiation therapy of extranodal marginal zone lymphomas." Annals of Lymphoma [Online], 4 (2020): n. pag. Web. 28 May. 2021  --The use of definitive moderate-dose RT (24-30 Gy) for these patients with localized disease results in a high rate of durable local control and often cure. -- For patients presenting with stage IE or stage IIE disease, RT is recommended if the volume of lung exposed to radiation is not excessively large.   Broccoli A, Zinzani PL. How do we sequence therapy for marginal zone lymphomas? Hematology Am Soc Hematol Educ Program. 2020 Dec 4;2020(1):295-305  --Available guidelines indicate locoregional treatments such as surgery and radiation therapy as the mainstay of the initial management of localized disease

## 2019-10-08 LAB — LACTATE DEHYDROGENASE: LDH: 221 U/L — ABNORMAL HIGH (ref 98–192)

## 2019-10-15 ENCOUNTER — Telehealth: Payer: Self-pay | Admitting: Hematology and Oncology

## 2019-10-15 ENCOUNTER — Other Ambulatory Visit: Payer: Self-pay | Admitting: Hematology and Oncology

## 2019-10-15 DIAGNOSIS — C884 Extranodal marginal zone B-cell lymphoma of mucosa-associated lymphoid tissue [MALT-lymphoma]: Secondary | ICD-10-CM

## 2019-10-15 NOTE — Telephone Encounter (Signed)
Scheduled per los. Called and left msg. Mailed printout  °

## 2019-10-18 ENCOUNTER — Ambulatory Visit: Payer: Medicaid Other | Admitting: Pulmonary Disease

## 2019-10-18 NOTE — Progress Notes (Deleted)
_0  ID: [name], female    DOB: 1960-10-02, 59 y.o.   MRN: [id number]  No chief complaint on file.   Referring provider: Gildardo Pounds, NP  HPI:  59 year old female current smoker followed in our office for abnormal CT  PMH: Alcoholism, severe bipolar disorder, hypertension, schizoaffective disorder Smoker/ Smoking History: Current smoker Maintenance:   Pt of: Dr. Valeta Harms  10/18/2019  - Visit   59 year old female current smoker followed in our office by Dr. Valeta Harms.  She was last seen in April/2021.  At that office visit it was recommended that her case will be discussed at the medical thoracic oncology conference.  If interventional radiology feels that this is accessible we will proceed forward with referral to interventional radiology.  If they do not believe this is accessible we will attempt electronic electromagnetic navigational bronchoscopy with tissue sampling and likely fiducial placement.  Case was discussed on 08/26/2019.  That information is listed below: Recommended plan was IR biopsy then possible SBRT Telephone note from 09/27/2019 reviewed the CT needle core biopsy positive for MALToma.  Patient established with Dr. Lorenso Courier with oncology excerpt of that plan from 10/07/2019 as listed below:  [place] 59 y.o. female with medical history significant for ETOH use, HTN, schizophrenia, and emphysema who presents for evaluation of a newly diagnosed pulmonary lymphoma.  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  Assuming that the lesion in the lung is the only site of disease she would be considered to [place] stage I.  On the PET CT scan it is concerning there is an area of activity within the colon, though this may represent physiological uptake.  I would recommend GI evaluation in order to effectively rule out an additional site  of MALT lymphoma.  There are no records of endoscopy in our system, the patient notes she has had colonoscopy previously.  I assume she was seen by the [facility] practice and we can make the referral back to them to try to reconnect the patient and obtain the prior records.  In terms of treatment for this pulmonary extranodal MALT lymphoma this is an extraordinarily rare cancer and data is limited.  For early stage disease it is recommended that the patient undergo either surgical resection or radiation therapy.  Unfortunately these 2 modalities have not been tried head-to-head and therefore superiority has not been established.  After discussion with the patient she noted that she would prefer to avoid surgery.  Additionally given her extent of lung disease I would agree that surgery would be of high risk for this patient.  Given the indolent nature of this lymphoma I think it would be reasonable to proceed with radiation therapy and then if progression is noted to consider the addition of rituximab based therapy.  Radiation therapy is well-established as a safe and effective form of treatment for pulmonary extranodal MALT lymphoma ([place], [place], [place] C. Pinnix, & Belia Heman. "Radiation therapy of extranodal marginal zone lymphomas." Annals of Lymphoma [Online], 4 (2020): n. pag. Web. 28 May. 2021).   # Pulmonary Extranodal MALT Lymphoma. [place] Stage I --findings are most consistent with a MALT lymphoma of the lung. Treatment of choice would consist of radiation therapy vs surgical resection. Patient favors radiation treatment at this time. --will refer to radiation oncology --area of physiological uptake in the colon is concerning, though nonspecific. Recommend  touching base with her gastroenterologist to see when her last screening colon was done and if they would consider repeat colonoscopy to assess the mucosa of the ascending colon --baseline Hep B, Hep C, and HIV testing to be performed  today (last done in 2011).  --baseline CBC, CMP, and ESR today --plan for f/u in clinic after radiation treatment is complete. If radiation therapy is not feasible, will have her return sooner for consideration of systemic therapy. Placeholder appointment made in 2 months time.  fgh  Questionaires / Pulmonary Flowsheets:   ACT:  No flowsheet data found.  MMRC: No flowsheet data found.  Epworth:  No flowsheet data found.  Tests:   Chest Imaging: Oct 2020 LDCT: RUL nodule, very peripheral.  Feb 2021 LDCT: RUL nodule, peripheral, still persistent  Nuclear medicine pet imaging March 2021: PET avid right upper lobe medial based, subpleural lung nodule SUV max 2.5 concerning for low-grade adenocarcinoma. The patient's images have been independently reviewed by me.     FENO:  No results found for: NITRICOXIDE  PFT: PFT Results Latest Ref Rng & Units 07/15/2019  FVC-Pre L 2.94  FVC-Predicted Pre % 109  FVC-Post L 2.85  FVC-Predicted Post % 106  Pre FEV1/FVC % % 71  Post FEV1/FCV % % 71  FEV1-Pre L 2.09  FEV1-Predicted Pre % 98  FEV1-Post L 2.04  DLCO UNC% % 76  DLCO COR %Predicted % 84  TLC L 8.44  TLC % Predicted % 167  RV % Predicted % 281    WALK:  No flowsheet data found.  Imaging: CT BIOPSY  Result Date: 09/24/2019 INDICATION: 59 year old female with bipolar 1 disorder and schizophrenia. She is an active smoker and has a metabolically active nodule in the medial and anterior aspect of the right upper lobe of the lung. She presents today for CT-guided biopsy. EXAM: CT-guided biopsy right upper lobe pulmonary nodule Interventional Radiologist:  Criselda Peaches, MD MEDICATIONS: None. ANESTHESIA/SEDATION: Fentanyl 100 mcg IV; Versed 2 mg IV Moderate Sedation Time:  15 minutes The patient was continuously monitored during the procedure by the interventional radiology nurse under my direct supervision. FLUOROSCOPY TIME:  None. COMPLICATIONS: None immediate. Estimated  blood loss:  0 PROCEDURE: Informed written consent was obtained from the patient after a thorough discussion of the procedural risks, benefits and alternatives. All questions were addressed. Maximal Sterile Barrier Technique was utilized including caps, mask, sterile gowns, sterile gloves, sterile drape, hand hygiene and skin antiseptic. A timeout was performed prior to the initiation of the procedure. A planning axial CT scan was performed. The nodule in the right upper lobe was successfully identified. A suitable skin entry site was selected and marked. The region was then sterilely prepped and draped in standard fashion using Betadine skin prep. Local anesthesia was attained by infiltration with 1% lidocaine. A small dermatotomy was made. Under intermittent CT fluoroscopic guidance, a 17 gauge trocar needle was advanced into the lung and positioned at the margin of the nodule. Care was taken to avoid the internal mammary artery and vein. This was achieved successfully. Multiple 18 gauge core biopsies were then coaxially obtained using the BioPince automated biopsy device. Biopsy specimens were placed in formalin and delivered to pathology for further analysis. The biopsy device and introducer needle were removed. Post biopsy axial CT imaging demonstrates no evidence of immediate complication. There is a small anterior pneumothorax. The pneumothorax creates just enough displacement of the anterior and medial aspect of the right upper lobe to confirm that  the nodule is contained completely within the lung and is not invasive or adherent to the adjacent mediastinum. The patient tolerated the procedure well. IMPRESSION: Technically successful CT-guided biopsy right upper lobe pulmonary nodule. Trace postprocedure pneumothorax creates just enough displacement of the anterior and medial aspect of the right upper lobe to confirm that the nodule is contained completely with the lung and is not invasive or adherent to the  adjacent anterior mediastinum. Signed, Criselda Peaches, MD, [facility] Radiology Electronically Signed   By: Jacqulynn Cadet M.D.   On: 09/24/2019 12:00   DG Chest Port 1 View  Result Date: 09/23/2019 CLINICAL DATA:  Assess for pneumothorax post right upper lobe biopsy EXAM: PORTABLE CHEST 1 VIEW COMPARISON:  CT 08/16/2019, radiograph 08/06/2019 FINDINGS: There is a small right apical pneumothorax. Streaky opacities in the lung bases favor atelectasis. No consolidative opacity or convincing features of edema. No visible pleural effusion or left pneumothorax. Cardiomediastinal contours are stable. No acute osseous or soft tissue abnormality. Degenerative changes are present in the imaged spine and shoulders. IMPRESSION: Small right apical pneumothorax.  Basilar atelectasis. Electronically Signed   By: Lovena Le M.D.   On: 09/23/2019 14:54    Lab Results:  CBC    Component Value Date/Time   WBC 4.1 10/07/2019 0944   WBC 3.3 (L) 09/23/2019 0946   RBC 3.69 (L) 10/07/2019 0944   HGB 12.9 10/07/2019 0944   HGB 13.7 05/05/2019 1355   HCT 37.8 10/07/2019 0944   HCT 38.9 05/05/2019 1355   PLT 119 (L) 10/07/2019 0944   PLT 130 (L) 05/05/2019 1355   MCV 102.4 (H) 10/07/2019 0944   MCV 100 (H) 05/05/2019 1355   MCH 35.0 (H) 10/07/2019 0944   MCHC 34.1 10/07/2019 0944   RDW 12.9 10/07/2019 0944   RDW 12.8 05/05/2019 1355   LYMPHSABS 1.8 10/07/2019 0944   MONOABS 0.3 10/07/2019 0944   EOSABS 0.0 10/07/2019 0944   BASOSABS 0.0 10/07/2019 0944    BMET    Component Value Date/Time   NA 141 10/07/2019 0944   NA 139 05/05/2019 1355   K 4.4 10/07/2019 0944   CL 106 10/07/2019 0944   CO2 23 10/07/2019 0944   GLUCOSE 78 10/07/2019 0944   BUN 8 10/07/2019 0944   BUN 6 05/05/2019 1355   CREATININE 0.83 10/07/2019 0944   CALCIUM 9.9 10/07/2019 0944   GFRNONAA >60 10/07/2019 0944   GFRAA >60 10/07/2019 0944    BNP    Component  Value Date/Time   BNP 214.2 (H) 06/07/2014 0951    ProBNP No results found for: PROBNP  Specialty Problems    None      Allergies  Allergen Reactions  . Penicillins Hives and Swelling    REACTION: swelling of tongue; hives Did it involve swelling of the face/tongue/throat, SOB, or low BP? Yes Did it involve sudden or severe rash/hives, skin peeling, or any reaction on the inside of your mouth or nose? No Did you need to seek medical attention at a hospital or doctor's office? Yes When did it last happen?~2014 If all above answers are "NO", may proceed with cephalosporin use.      There is no immunization history on file for this patient.  Past Medical History:  Diagnosis Date  . Anemia 1981   During chilbirth  . Bipolar 1 disorder ([place])   . Depression   . Hypertension   . Mental disorder   . Schizophrenia ([place])  Tobacco History: Social History   Tobacco Use  Smoking Status Current Every Day Smoker  . Packs/day: 0.75  . Years: 42.00  . Pack years: 31.50  . Types: Cigarettes  Smokeless Tobacco Never Used  Tobacco Comment   pt smoking about 2 cig per day   Ready to quit: Not Answered Counseling given: Not Answered Comment: pt smoking about 2 cig per day   Continue to not smoke  Outpatient Encounter Medications as of 10/18/2019  Medication Sig  . amLODipine (NORVASC) 5 MG tablet Take 1 tablet (5 mg total) by mouth daily.  . Blood Pressure Monitor DEVI Please provide patient with insurance approved blood pressure monitor  . Multiple Vitamin (MULTIVITAMIN WITH MINERALS) TABS tablet Take 1 tablet by mouth daily. Women's One-A-Day Mulitvitamin  . umeclidinium-vilanterol (ANORO ELLIPTA) 62.5-25 MCG/INH AEPB Inhale 1 puff into the lungs daily.   No facility-administered encounter medications on file as of 10/18/2019.     Review of Systems  Review of Systems   Physical Exam  LMP 10/25/2012   Wt Readings from Last 5 Encounters:  10/07/19 127 lb 4.8  oz (57.7 kg)  09/23/19 126 lb (57.2 kg)  08/25/19 126 lb 12.8 oz (57.5 kg)  08/16/19 130 lb (59 kg)  08/06/19 130 lb (59 kg)    BMI Readings from Last 5 Encounters:  10/07/19 21.85 kg/m  09/23/19 21.63 kg/m  08/25/19 21.77 kg/m  08/16/19 22.31 kg/m  08/06/19 22.31 kg/m     Physical Exam    Assessment & Plan:   No problem-specific Assessment & Plan notes found for this encounter.    No follow-ups on file.   Lauraine Rinne, NP 10/18/2019   This appointment required *** minutes of patient care (this includes precharting, chart review, review of results, face-to-face care, etc.).

## 2019-10-19 ENCOUNTER — Encounter: Payer: Self-pay | Admitting: Physician Assistant

## 2019-11-03 NOTE — Progress Notes (Signed)
Lymphoma Location(s) / Histology:  Pulmonary Extranodal MALT Lymphoma  [name] was initially found to have a lung lesion on 06/18/2019 while receiving a screening lung CT for her smoking history.  She was noted to have a long RADS 4A lesion suspicious for malignancy.  A PET CT scan was recommended.  On 07/19/2019 the patient had a PET CT scan which showed an irregular medial right upper lobe nodule with an SUV of 2.5.  Biopsies revealed:  09/23/2019 FINAL MICROSCOPIC DIAGNOSIS:  A. 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   Past/Anticipated interventions by medical oncology, if any: Under care of Dr. Narda Rutherford IV --findings are most consistent with a MALT lymphoma of the lung. Treatment of choice would consist of radiation therapy vs surgical resection. Patient favors radiation treatment at this time. --area of physiological uptake in the colon is concerning, though nonspecific. Recommend touching base with her gastroenterologist to see when her last screening colon was done and if they would consider repeat colonoscopy to assess the mucosa of the ascending colon --baseline Hep B, Hep C, and HIV testing to be performed today (last done in 2011).  --baseline CBC, CMP, and ESR today --plan for f/u in clinic after radiation treatment is complete. If radiation therapy is not feasible, will have her return sooner for consideration of systemic therapy. Placeholder appointment made in 2 months time.   Weight changes, if any, over the past 6 months: Patient states she lost about 40 lbs due to loss of appetite.   Recurrent fevers, or drenching night sweats, if any:  Patient was dealing with daily night sweats (she thought it was related to menopause so she didn't report to her PCP); she states they still occur but are not as bad as a few months ago  SAFETY ISSUES:  Prior radiation? No  Pacemaker/ICD? No  Possible current  pregnancy? No  Is the patient on methotrexate? No  Current Complaints / other details:   She has an appointment at [place] on 11/11/2019 to discuss the area of physiological uptake in the colon that is concerning, and see if they would consider a repeat colonoscopy to assess the mucosa of the ascending colon (per Dr. Lorenso Courier)

## 2019-11-05 ENCOUNTER — Encounter: Payer: Self-pay | Admitting: Radiation Oncology

## 2019-11-05 ENCOUNTER — Other Ambulatory Visit: Payer: Self-pay

## 2019-11-05 ENCOUNTER — Ambulatory Visit
Admission: RE | Admit: 2019-11-05 | Discharge: 2019-11-05 | Disposition: A | Discharge status: Home / Self Care | Payer: Medicaid Other | Source: Ambulatory Visit | Attending: Radiation Oncology | Admitting: Radiation Oncology

## 2019-11-05 ENCOUNTER — Ambulatory Visit: Admission: RE | Admit: 2019-11-05 | Payer: Medicaid Other | Source: Ambulatory Visit

## 2019-11-05 DIAGNOSIS — C884 Extranodal marginal zone B-cell lymphoma of mucosa-associated lymphoid tissue [MALT-lymphoma]: Secondary | ICD-10-CM

## 2019-11-08 ENCOUNTER — Encounter: Payer: Self-pay | Admitting: Radiation Oncology

## 2019-11-08 DIAGNOSIS — C884 Extranodal marginal zone b-cell lymphoma of mucosa-associated lymphoid tissue (malt-lymphoma) not having achieved remission: Secondary | ICD-10-CM | POA: Insufficient documentation

## 2019-11-08 NOTE — Progress Notes (Signed)
Radiation Oncology         [phone] ________________________________  Initial outpatient Consultation by telephone.  The patient opted for telemedicine to maximize safety during the pandemic.  MyChart video was not obtainable.   Name: [name] MRN: [id number]  Date: 11/05/2019  DOB: 08-04-1960  JQ:[id number], Vernia Buff, NP  Orson Slick, MD   REFERRING PHYSICIAN: Orson Slick, MD  DIAGNOSIS:    ICD-10-CM   1. 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 female who presented for routine lung cancer screening on 03/02/2019. The scan showed focal patchy opacity in medial RUL, and short-term follow up was recommended. She returned for repeat lung cancer screening CT on 06/18/2019 showing: persistent medial RUL opacity with irregular margins (lung-RADS 4A, suspicious).  She was referred to Dr. Valeta Harms on 07/08/2019 and underwent PET scan on 07/19/2019. PET scan showed a 1.8 cm irregular medial RUL lung nodule. She was referred to Dr. Kipp Brood to discuss wedge resection, but she ultimately opted against the procedure due to fear of complications.  Needle core biopsy was performed on the RUL nodule on 09/23/2019 revealing: low-grade non-Hodgkin's B-cell lymphoma. (MALT) lymphoma is favored.  She then met with Dr. Lorenso Courier on 10/07/2019, who discussed treatment options with the patient.  She reports asthma. She takes a "puff" of medication a couple times a week. Used to smoke 1ppd / now smokes 2 cigs/day.  She does report hot flashes as well as episodes of chills and sweats.  It is unclear if this is related to menopause or not.  She stopped having her menstrual cycles about 3 years ago and ever since then has been having hot flashes.   She states she has lost about 40 pounds which she attributes to eating less and decreased appetite.  However, looking at her medical records, it appears that over the past 7 years, she has never weighed more than 137 pounds.  Her current weight is 127 pounds.  She acknowledges that the weight loss is unintentional.  She has discussed these issues with medical oncology as well.  Medical oncology is coordinating a referral to gastroenterology to rule out gastric involvement by her MALT lymphoma.  I have personally spoken with Dr. Lorenso Courier about her case.  PREVIOUS RADIATION THERAPY: No  PAST MEDICAL HISTORY:  has a past medical history of Anemia (1981), Bipolar 1 disorder ([place]), Depression, Hypertension, Mental disorder, and Schizophrenia ([place]).    PAST SURGICAL HISTORY: Past Surgical History:  Procedure Laterality Date  . NECK SURGERY     cyst  . TUBAL LIGATION      FAMILY HISTORY: family history includes Breast cancer in her maternal aunt, maternal aunt, and maternal aunt; Diabetes in her mother.  SOCIAL HISTORY:  reports that she has been smoking cigarettes. She has a 31.50 pack-year smoking history. She has never used smokeless tobacco. She reports current alcohol use of about 4.0 standard drinks of alcohol per week. She reports current drug use. Frequency: 2.00 times per week. Drug: Marijuana.  ALLERGIES: Penicillins  MEDICATIONS:  Current Outpatient Medications  Medication Sig Dispense Refill  . amLODipine (NORVASC) 5 MG tablet Take 1 tablet (5 mg total) by mouth daily. 90 tablet 3  . Blood Pressure  Monitor DEVI Please provide patient with insurance approved blood pressure monitor 1 each 0  . Multiple Vitamin (MULTIVITAMIN WITH MINERALS) TABS tablet Take 1 tablet by mouth daily. Women's One-A-Day Mulitvitamin    . umeclidinium-vilanterol (ANORO ELLIPTA) 62.5-25 MCG/INH AEPB Inhale 1 puff into the lungs daily. 60 each 0   No current facility-administered medications for this encounter.     REVIEW OF SYSTEMS:  Notable for that above.   PHYSICAL EXAM:  vitals were not taken for this visit.   Wt Readings from Last 3 Encounters:  10/07/19 127 lb 4.8 oz (57.7 kg)  09/23/19 126 lb (57.2 kg)  08/25/19 126 lb 12.8 oz (57.5 kg)   She is alert and oriented and in no acute distress  LABORATORY DATA:  Lab Results  Component Value Date   WBC 4.1 10/07/2019   HGB 12.9 10/07/2019   HCT 37.8 10/07/2019   MCV 102.4 (H) 10/07/2019   PLT 119 (L) 10/07/2019   CMP     Component Value Date/Time   NA 141 10/07/2019 0944   NA 139 05/05/2019 1355   K 4.4 10/07/2019 0944   CL 106 10/07/2019 0944   CO2 23 10/07/2019 0944   GLUCOSE 78 10/07/2019 0944   BUN 8 10/07/2019 0944   BUN 6 05/05/2019 1355   CREATININE 0.83 10/07/2019 0944   CALCIUM 9.9 10/07/2019 0944   PROT 8.3 (H) 10/07/2019 0944   PROT 8.0 05/05/2019 1355   ALBUMIN 4.0 10/07/2019 0944   ALBUMIN 5.0 (H) 05/05/2019 1355   AST 71 (H) 10/07/2019 0944   ALT 37 10/07/2019 0944   ALKPHOS 80 10/07/2019 0944   BILITOT 0.3 10/07/2019 0944   GFRNONAA >60 10/07/2019 0944   GFRAA >60 10/07/2019 0944        RADIOGRAPHY: As above, I have personally reviewed her images.   IMPRESSION/PLAN: This is a very pleasant 59 year old woman with a history of MALT lymphoma of the right upper lung.  This appears to be stage I.  She is not interested in surgery due to potential complications.  She discussed the option of systemic therapy with medical oncology.  Dr. Lorenso Courier favors a local treatment to the disease that we can see and then consider systemic therapy if she develops signs of progression in the future.  The patient is enthusiastic about this approach.  It is unclear as to whether she has had true B symptoms.  Based on my review of her records, she has not lost more than 10 pounds over the past several years (this is less than her stated weight loss).  Her history of sweats could be related to perimenopausal hot flashes.  Medical  oncology is aware of the symptoms.  Today, I talked to the patient about the findings and work-up thus far. We discussed the patient's diagnosis of MALT lymphoma of the right upper lung and general treatment for this, highlighting the role of radiotherapy in the management. We discussed the available radiation techniques, and focused on the details of logistics and delivery.    We discussed the risks, benefits, and side effects of radiotherapy. Side effects may include but not necessarily be limited to: Fatigue, skin irritation, injury to the lung or other internal organs of the thorax; no guarantees of treatment were given.  Anticipate she will receive treatment of approximately 3 weeks. The patient was encouraged to ask questions that I answered to the best of my ability. After thorough discussion, the consensus is to proceed with treatment.  She will be  scheduled in the near future for CT simulation.  I look forward to participating in her care.  I asked the patient today about tobacco use. The patient uses tobacco.  I advised the patient to quit. Services were offered by me today including outpatient counseling and pharmacotherapy. I assessed for the willingness to attempt to quit and provided encouragement and demonstrated willingness to make referrals and/or prescriptions to help the patient attempt to quit. The patient has follow-up with the oncologic team to touch base on their tobacco use and /or cessation efforts.   For now, the plan is that she will call 1 800 quit now and talk to them about free prescriptions for nicotine gum as well as a comprehensive cessation plan.   This encounter was provided by telemedicine platform by telephone.  The patient opted for telemedicine to maximize safety during the pandemic.  MyChart video was not obtainable. The patient has given verbal consent for this type of encounter and has been advised to only accept a meeting of this type in a secure network  environment. On date of service, in total, I spent 50 minutes on this encounter.  The attendants for this meeting include Eppie Gibson  and Daphine Deutscher.  During the encounter, Eppie Gibson was located at [facility] Radiation Oncology Department.  [name] was located at home.      __________________________________________   Eppie Gibson, MD  This document serves as a record of services personally performed by Eppie Gibson, MD. It was created on her behalf by Wilburn Mylar, a trained medical scribe. The creation of this record is based on the scribe's personal observations and the provider's statements to them. This document has been checked and approved by the attending provider.

## 2019-11-09 ENCOUNTER — Ambulatory Visit
Admission: RE | Admit: 2019-11-09 | Discharge: 2019-11-09 | Disposition: A | Discharge status: Home / Self Care | Payer: Medicaid Other | Source: Ambulatory Visit | Attending: Radiation Oncology | Admitting: Radiation Oncology

## 2019-11-09 ENCOUNTER — Other Ambulatory Visit: Payer: Self-pay

## 2019-11-09 DIAGNOSIS — C884 Extranodal marginal zone B-cell lymphoma of mucosa-associated lymphoid tissue [MALT-lymphoma]: Secondary | ICD-10-CM | POA: Insufficient documentation

## 2019-11-10 DIAGNOSIS — C884 Extranodal marginal zone B-cell lymphoma of mucosa-associated lymphoid tissue [MALT-lymphoma]: Secondary | ICD-10-CM | POA: Diagnosis not present

## 2019-11-11 ENCOUNTER — Encounter: Payer: Self-pay | Admitting: Physician Assistant

## 2019-11-11 ENCOUNTER — Ambulatory Visit: Payer: Medicaid Other | Admitting: Physician Assistant

## 2019-11-11 VITALS — BP 90/60 | HR 80 | Ht 63.5 in | Wt 137.0 lb

## 2019-11-11 DIAGNOSIS — R9389 Abnormal findings on diagnostic imaging of other specified body structures: Secondary | ICD-10-CM | POA: Diagnosis not present

## 2019-11-11 DIAGNOSIS — Z01818 Encounter for other preprocedural examination: Secondary | ICD-10-CM

## 2019-11-11 MED ORDER — SUPREP BOWEL PREP KIT 17.5-3.13-1.6 GM/177ML PO SOLN
1.0000 | Freq: Once | ORAL | 0 refills | Status: AC
Start: 2019-11-11 — End: 2019-11-11

## 2019-11-11 NOTE — Progress Notes (Signed)
Reviewed and agree with documentation and assessment and plan. K. Veena Jaydy Fitzhenry , MD   

## 2019-11-11 NOTE — Patient Instructions (Signed)
You have been scheduled for a colonoscopy. Please follow written instructions given to you at your visit today.  Please pick up your prep supplies at the pharmacy within the next 1-3 days. If you use inhalers (even only as needed), please bring them with you on the day of your procedure. Your physician has requested that you go to www.startemmi.com and enter the access code given to you at your visit today. This web site gives a general overview about your procedure. However, you should still follow specific instructions given to you by our office regarding your preparation for the procedure.  I appreciate the  opportunity to care for you  Thank You   Amy Trellis Paganini, PA-C

## 2019-11-11 NOTE — Progress Notes (Signed)
Subjective:    Patient ID: [name], female    DOB: 05-31-1960, 59 y.o.   MRN: [id number]  HPI  Yamilette is a pleasant 59 year old African-American female, new to GI today and referred by Dr. Narda Rutherford with recent abnormal PET scan done March 2021 PET scan was done to follow-up in abnormal chest CT and she has since been diagnosed with a pulmonary extranodal MALT lymphoma stage I for which she is starting radiation therapy this month.  The PET scan showed low-grade focal activity in the area of the splenic flexure of the colon and sigmoid diverticulosis.  Report states: Activity most likely physiologic although colon screening recommended. Patient has history of hypertension, bipolar disorder, schizoaffective disorder, and alcohol abuse. She believes that she had one previous colonoscopy done nine or 10 years ago in [place] but does not know who performed a colonoscopy or the exact results.  Family history is negative for colon cancer. She currently has no GI symptoms, specifically no complaints of abdominal pain, changes in bowel habits melena or hematochezia.  No problems with constipation or diarrhea.  She says she had been drinking regularly and stopped about 2 weeks ago and her appetite has significantly improved. Most recent labs May 2021 WBC 4.1 hemoglobin 12.9 platelets 119.  AST elevated at 71, hepatitis serologies showed hepatitis B surface antibody positive.   Review of Systems Pertinent positive and negative review of systems were noted in the above HPI section.  All other review of systems was otherwise negative.  Outpatient Encounter Medications as of 11/11/2019  Medication Sig  . amLODipine (NORVASC) 5 MG tablet Take 1 tablet (5 mg total) by mouth daily.  . Blood Pressure Monitor DEVI Please provide patient with insurance approved blood pressure monitor  . Multiple Vitamin (MULTIVITAMIN WITH MINERALS) TABS tablet Take 1 tablet by mouth daily. Women's One-A-Day Mulitvitamin   . umeclidinium-vilanterol (ANORO ELLIPTA) 62.5-25 MCG/INH AEPB Inhale 1 puff into the lungs daily.  . Na Sulfate-K Sulfate-Mg Sulf (SUPREP BOWEL PREP KIT) 17.5-3.13-1.6 GM/177ML SOLN Take 1 kit by mouth once for 1 dose.   No facility-administered encounter medications on file as of 11/11/2019.   Allergies  Allergen Reactions  . Penicillins Hives and Swelling    REACTION: swelling of tongue; hives Did it involve swelling of the face/tongue/throat, SOB, or low BP? Yes Did it involve sudden or severe rash/hives, skin peeling, or any reaction on the inside of your mouth or nose? No Did you need to seek medical attention at a hospital or doctor's office? Yes When did it last happen?~2014 If all above answers are "NO", may proceed with cephalosporin use.    Patient Active Problem List   Diagnosis Date Noted  . 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 Screening breast examination 12/24/2018  . Breast discharge 12/24/2018  . Schizoaffective disorder, unspecified condition 01/08/2013  . Severe bipolar disorder with psychotic features ([place]) 01/07/2013  . ALCOHOLISM 11/04/2009  . DEPRESSION, RECURRENT 11/04/2009  . ESSENTIAL HYPERTENSION, BENIGN 11/04/2009  . ABDOMINAL PAIN, GENERALIZED 11/04/2009   Social History   Socioeconomic History  . Marital status: Divorced    Spouse name: Not on file  . Number of children: 3  . Years of education: Not on file  . Highest education level: 9th grade  Occupational History  . Occupation: retired  Tobacco Use  . Smoking status: Current Every Day Smoker    Packs/day: 0.75    Years: 42.00  Pack years: 31.50    Types: Cigarettes  . Smokeless tobacco: Former Systems developer    Types: Snuff  . Tobacco comment: pt smoking about 2 cig per day  Vaping Use  . Vaping Use: Never used  Substance and Sexual Activity  . Alcohol use: Not Currently    Alcohol/week: 4.0 standard drinks     Types: 4 Cans of beer per week    Comment: clean for 2 weeks 11/11/19  . Drug use: Yes    Frequency: 2.0 times per week    Types: Marijuana    Comment: THC about once per week  . Sexual activity: Yes    Birth control/protection: Surgical  Other Topics Concern  . Not on file  Social History Narrative  . Not on file   Social Determinants of Health   Financial Resource Strain:   . Difficulty of Paying Living Expenses:   Food Insecurity:   . Worried About Charity fundraiser in the Last Year:   . Arboriculturist in the Last Year:   Transportation Needs: No Transportation Needs  . Lack of Transportation (Medical): No  . Lack of Transportation (Non-Medical): No  Physical Activity:   . Days of Exercise per Week:   . Minutes of Exercise per Session:   Stress:   . Feeling of Stress :   Social Connections:   . Frequency of Communication with Friends and Family:   . Frequency of Social Gatherings with Friends and Family:   . Attends Religious Services:   . Active Member of Clubs or Organizations:   . Attends Archivist Meetings:   Marland Kitchen Marital Status:   Intimate Partner Violence:   . Fear of Current or Ex-Partner:   . Emotionally Abused:   Marland Kitchen Physically Abused:   . Sexually Abused:     Ms. Chabot family history includes Breast cancer in her cousin, maternal aunt, maternal aunt, and maternal aunt; Diabetes in her brother, maternal aunt, maternal grandmother, mother, and son; Hypertension in her daughter, maternal grandmother, and mother; Lung cancer in her father; Throat cancer in her maternal aunt.      Objective:    Vitals:   11/11/19 1128  BP: 90/60  Pulse: 80    Physical Exam Well-developed well-nourished older AA  female in no acute distress.  Height, Weight, 137BMI 23.9  HEENT; nontraumatic normocephalic, EOMI, PER R LA, sclera anicteric. Oropharynx;not examined Neck; supple, no JVD Cardiovascular; regular rate and rhythm with S1-S2, no murmur rub or  gallop Pulmonary; Clear bilaterally Abdomen; soft, nontender, nondistended, no palpable mass or hepatosplenomegaly, bowel sounds are active Rectal;not done Skin; benign exam, no jaundice rash or appreciable lesions Extremities; no clubbing cyanosis or edema skin warm and dry Neuro/Psych; alert and oriented x4, grossly nonfocal mood and affect appropriate       Assessment & Plan:   #48 59 year old African-American female with new diagnosis of pulmonary extranodal MALT lymphoma stage I who was also found to have low-grade focal activity in the area of the splenic flexure on recent PET scan. Patient is asymptomatic from a GI standpoint. She may have had one prior colonoscopy done close to 10 years ago.  #2  History of hypertension 3.  Bipolar disorder 4.  Schizoaffective disorder 5.  History of EtOH abuse. #6  mild thrombocytopenia -CT abdomen and pelvis done February 2020 showed a normal-appearing liver  Plan; patient has signed a release and will request any available colonoscopy reports from other GI practices in [place]. Patient  will be scheduled for colonoscopy with Dr. Rush Landmark.  Procedure was discussed in detail with the patient including indications risks and benefits and she is agreeable to proceed. Patient has not been vaccinated against COVID-19, preprocedure testing will be done.  Lekia Nier Genia Harold PA-C 11/11/2019   Cc: Gildardo Pounds, NP

## 2019-11-17 ENCOUNTER — Ambulatory Visit
Admission: RE | Admit: 2019-11-17 | Discharge: 2019-11-17 | Disposition: A | Discharge status: Home / Self Care | Payer: Medicaid Other | Source: Ambulatory Visit | Attending: Radiation Oncology | Admitting: Radiation Oncology

## 2019-11-17 ENCOUNTER — Other Ambulatory Visit: Payer: Self-pay

## 2019-11-17 DIAGNOSIS — C884 Extranodal marginal zone B-cell lymphoma of mucosa-associated lymphoid tissue [MALT-lymphoma]: Secondary | ICD-10-CM | POA: Diagnosis present

## 2019-11-18 ENCOUNTER — Ambulatory Visit
Admission: RE | Admit: 2019-11-18 | Discharge: 2019-11-18 | Disposition: A | Discharge status: Home / Self Care | Payer: Medicaid Other | Source: Ambulatory Visit | Attending: Radiation Oncology | Admitting: Radiation Oncology

## 2019-11-18 ENCOUNTER — Other Ambulatory Visit: Payer: Self-pay

## 2019-11-18 DIAGNOSIS — C884 Extranodal marginal zone B-cell lymphoma of mucosa-associated lymphoid tissue [MALT-lymphoma]: Secondary | ICD-10-CM | POA: Diagnosis not present

## 2019-11-19 ENCOUNTER — Ambulatory Visit
Admission: RE | Admit: 2019-11-19 | Discharge: 2019-11-19 | Disposition: A | Discharge status: Home / Self Care | Payer: Medicaid Other | Source: Ambulatory Visit | Attending: Radiation Oncology | Admitting: Radiation Oncology

## 2019-11-19 ENCOUNTER — Other Ambulatory Visit: Payer: Self-pay

## 2019-11-19 DIAGNOSIS — C884 Extranodal marginal zone B-cell lymphoma of mucosa-associated lymphoid tissue [MALT-lymphoma]: Secondary | ICD-10-CM | POA: Diagnosis not present

## 2019-11-20 ENCOUNTER — Ambulatory Visit: Payer: Medicaid Other

## 2019-11-21 ENCOUNTER — Ambulatory Visit: Payer: Medicaid Other

## 2019-11-22 ENCOUNTER — Other Ambulatory Visit: Payer: Self-pay

## 2019-11-22 ENCOUNTER — Ambulatory Visit
Admission: RE | Admit: 2019-11-22 | Discharge: 2019-11-22 | Disposition: A | Discharge status: Home / Self Care | Payer: Medicaid Other | Source: Ambulatory Visit | Attending: Radiation Oncology | Admitting: Radiation Oncology

## 2019-11-22 DIAGNOSIS — C884 Extranodal marginal zone B-cell lymphoma of mucosa-associated lymphoid tissue [MALT-lymphoma]: Secondary | ICD-10-CM | POA: Diagnosis not present

## 2019-11-22 NOTE — Progress Notes (Signed)
Pt here for patient teaching.    Pt given Radiation and You booklet.    Reviewed areas of pertinence such as fatigue, hair loss, skin changes, throat changes, cough and shortness of breath .   Pt able to give teach back of to pat skin, use unscented/gentle soap and drink plenty of water.   Pt demonstrated understanding and verbalizes understanding of information given and will contact nursing with any questions or concerns.    [email or web address]

## 2019-11-23 ENCOUNTER — Other Ambulatory Visit: Payer: Self-pay

## 2019-11-23 ENCOUNTER — Ambulatory Visit
Admission: RE | Admit: 2019-11-23 | Discharge: 2019-11-23 | Disposition: A | Discharge status: Home / Self Care | Payer: Medicaid Other | Source: Ambulatory Visit | Attending: Radiation Oncology | Admitting: Radiation Oncology

## 2019-11-23 DIAGNOSIS — C884 Extranodal marginal zone B-cell lymphoma of mucosa-associated lymphoid tissue [MALT-lymphoma]: Secondary | ICD-10-CM | POA: Diagnosis not present

## 2019-11-24 ENCOUNTER — Ambulatory Visit
Admission: RE | Admit: 2019-11-24 | Discharge: 2019-11-24 | Disposition: A | Discharge status: Home / Self Care | Payer: Medicaid Other | Source: Ambulatory Visit | Attending: Radiation Oncology | Admitting: Radiation Oncology

## 2019-11-24 ENCOUNTER — Other Ambulatory Visit: Payer: Self-pay

## 2019-11-24 DIAGNOSIS — C884 Extranodal marginal zone B-cell lymphoma of mucosa-associated lymphoid tissue [MALT-lymphoma]: Secondary | ICD-10-CM | POA: Diagnosis not present

## 2019-11-25 ENCOUNTER — Other Ambulatory Visit: Payer: Self-pay

## 2019-11-25 ENCOUNTER — Ambulatory Visit
Admission: RE | Admit: 2019-11-25 | Discharge: 2019-11-25 | Disposition: A | Discharge status: Home / Self Care | Payer: Medicaid Other | Source: Ambulatory Visit | Attending: Radiation Oncology | Admitting: Radiation Oncology

## 2019-11-25 DIAGNOSIS — C884 Extranodal marginal zone B-cell lymphoma of mucosa-associated lymphoid tissue [MALT-lymphoma]: Secondary | ICD-10-CM | POA: Diagnosis not present

## 2019-11-26 ENCOUNTER — Ambulatory Visit
Admission: RE | Admit: 2019-11-26 | Discharge: 2019-11-26 | Disposition: A | Discharge status: Home / Self Care | Payer: Medicaid Other | Source: Ambulatory Visit | Attending: Radiation Oncology | Admitting: Radiation Oncology

## 2019-11-26 ENCOUNTER — Other Ambulatory Visit: Payer: Self-pay

## 2019-11-26 DIAGNOSIS — C884 Extranodal marginal zone B-cell lymphoma of mucosa-associated lymphoid tissue [MALT-lymphoma]: Secondary | ICD-10-CM | POA: Diagnosis not present

## 2019-11-29 ENCOUNTER — Other Ambulatory Visit: Payer: Self-pay

## 2019-11-29 ENCOUNTER — Ambulatory Visit
Admission: RE | Admit: 2019-11-29 | Discharge: 2019-11-29 | Disposition: A | Discharge status: Home / Self Care | Payer: Medicaid Other | Source: Ambulatory Visit | Attending: Radiation Oncology | Admitting: Radiation Oncology

## 2019-11-29 DIAGNOSIS — C884 Extranodal marginal zone B-cell lymphoma of mucosa-associated lymphoid tissue [MALT-lymphoma]: Secondary | ICD-10-CM | POA: Diagnosis not present

## 2019-11-30 ENCOUNTER — Other Ambulatory Visit: Payer: Self-pay

## 2019-11-30 ENCOUNTER — Ambulatory Visit
Admission: RE | Admit: 2019-11-30 | Discharge: 2019-11-30 | Disposition: A | Discharge status: Home / Self Care | Payer: Medicaid Other | Source: Ambulatory Visit | Attending: Radiation Oncology | Admitting: Radiation Oncology

## 2019-11-30 DIAGNOSIS — C884 Extranodal marginal zone B-cell lymphoma of mucosa-associated lymphoid tissue [MALT-lymphoma]: Secondary | ICD-10-CM | POA: Diagnosis not present

## 2019-12-01 ENCOUNTER — Ambulatory Visit
Admission: RE | Admit: 2019-12-01 | Discharge: 2019-12-01 | Disposition: A | Discharge status: Home / Self Care | Payer: Medicaid Other | Source: Ambulatory Visit | Attending: Radiation Oncology | Admitting: Radiation Oncology

## 2019-12-01 ENCOUNTER — Other Ambulatory Visit: Payer: Self-pay

## 2019-12-01 DIAGNOSIS — C884 Extranodal marginal zone B-cell lymphoma of mucosa-associated lymphoid tissue [MALT-lymphoma]: Secondary | ICD-10-CM | POA: Diagnosis not present

## 2019-12-02 ENCOUNTER — Other Ambulatory Visit: Payer: Self-pay

## 2019-12-02 ENCOUNTER — Ambulatory Visit
Admission: RE | Admit: 2019-12-02 | Discharge: 2019-12-02 | Disposition: A | Discharge status: Home / Self Care | Payer: Medicaid Other | Source: Ambulatory Visit | Attending: Radiation Oncology | Admitting: Radiation Oncology

## 2019-12-02 DIAGNOSIS — C884 Extranodal marginal zone B-cell lymphoma of mucosa-associated lymphoid tissue [MALT-lymphoma]: Secondary | ICD-10-CM | POA: Diagnosis not present

## 2019-12-03 ENCOUNTER — Ambulatory Visit
Admission: RE | Admit: 2019-12-03 | Discharge: 2019-12-03 | Disposition: A | Discharge status: Home / Self Care | Payer: Medicaid Other | Source: Ambulatory Visit | Attending: Radiation Oncology | Admitting: Radiation Oncology

## 2019-12-03 ENCOUNTER — Other Ambulatory Visit: Payer: Self-pay

## 2019-12-03 DIAGNOSIS — C884 Extranodal marginal zone B-cell lymphoma of mucosa-associated lymphoid tissue [MALT-lymphoma]: Secondary | ICD-10-CM | POA: Diagnosis not present

## 2019-12-05 ENCOUNTER — Other Ambulatory Visit: Payer: Self-pay | Admitting: Hematology and Oncology

## 2019-12-05 DIAGNOSIS — C884 Extranodal marginal zone B-cell lymphoma of mucosa-associated lymphoid tissue [MALT-lymphoma]: Secondary | ICD-10-CM

## 2019-12-05 NOTE — Progress Notes (Signed)
[facility] Telephone:([phone]   [phone]  PROGRESS NOTE  Patient Care Team: Gildardo Pounds, NP as PCP - General (Nurse Practitioner)  Hematological/Oncological History # Pulmonary Extranodal MALT Lymphoma, [place] Stage I 1) 06/18/2019: Screening Lung CT noted a Lung-RADS 4A lesion, suspicious. PET-CT suggested. 2) 07/19/2019: PET CT performed showed irregular medial right upper lobe nodule shown on recent CT persists and has a maximum SUV of 2.5. Additionally there was noted to be colon activity  3) 08/23/2019: Right Upper Lobe Wedge resection planned. Patient declined this procedure. 4)  09/23/2019: CT guided biopsy of the lesion performed, findings consistent with an extranodal marginal zone lymphoma of mucosal associated tissue (MALT) lymphoma is favored. 5) 10/07/2019: establish care with Dr. Lorenso Courier  6) 11/05/2019: established care with Dr. Isidore Moos in [place]. Started treatment on 11/18/2019.   Interval History:  [name] 59 y.o. female with medical history significant for pulmonary MALT lymphoma who presents for a follow up visit. The patient's last visit was on 10/07/2019. In the interim since the last visit she has connected with Rad/Onc and has started radiation therapy, with her last session scheduled for tomorrow.  On exam today [name] reports that she has been tolerating radiation therapy quite well.  She reports that she has had some minor issues with itching of the skin around the radiation site, but that these have improved.  She notes that she has had good appetite and great levels of energy.  She denies having any cough, shortness of breath, fevers, chills, sweats, nausea, vomiting or diarrhea.  She notes that she is scheduled for colonoscopy to be performed on 12/20/2019 in order to discern the cause of the PET avidity at that site.  Overall she had no additional questions comments or concerns today.  A full 10 point ROS is listed  below.  MEDICAL HISTORY:  Past Medical History:  Diagnosis Date  . Anemia 1981   During chilbirth  . Bipolar 1 disorder ([place])   . Depression   . Emphysema lung ([place])   . Hypertension   . Lymphoma ([place])   . Mental disorder   . Schizophrenia ([place])     SURGICAL HISTORY: Past Surgical History:  Procedure Laterality Date  . NECK SURGERY     cyst  . TUBAL LIGATION      SOCIAL HISTORY: Social History   Socioeconomic History  . Marital status: Divorced    Spouse name: Not on file  . Number of children: 3  . Years of education: Not on file  . Highest education level: 9th grade  Occupational History  . Occupation: retired  Tobacco Use  . Smoking status: Current Every Day Smoker    Packs/day: 0.75    Years: 42.00    Pack years: 31.50    Types: Cigarettes  . Smokeless tobacco: Former Systems developer    Types: Snuff  . Tobacco comment: pt smoking about 2 cig per day  Vaping Use  . Vaping Use: Never used  Substance and Sexual Activity  . 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 Drug use: Yes    Frequency: 2.0 times per week    Types: Marijuana    Comment: THC about once per week  . Sexual activity: Yes    Birth control/protection: Surgical  Other Topics Concern  . Not on file  Social  History Narrative  . Not on file   Social Determinants of Health   Financial Resource Strain:   . Difficulty of Paying Living Expenses:   Food Insecurity:   . Worried About Charity fundraiser in the Last Year:   . Arboriculturist in the Last Year:   Transportation Needs: No Transportation Needs  . Lack of Transportation (Medical): No  . Lack of Transportation (Non-Medical): No  Physical Activity:   . Days of Exercise per Week:   . Minutes of Exercise per Session:   Stress:   . Feeling of Stress :   Social Connections:   . Frequency of Communication with Friends and Family:   . Frequency  of Social Gatherings with Friends and Family:   . Attends Religious Services:   . Active Member of Clubs or Organizations:   . Attends Archivist Meetings:   Marland Kitchen Marital Status:   Intimate Partner Violence:   . Fear of Current or Ex-Partner:   . Emotionally Abused:   Marland Kitchen Physically Abused:   . Sexually Abused:     FAMILY HISTORY: Family History  Problem Relation Age of Onset  . Breast cancer Maternal Aunt   . Diabetes Maternal Aunt   . Breast cancer Maternal Aunt   . Breast cancer Maternal Aunt   . Diabetes Mother   . Hypertension Mother   . Lung cancer Father        smoker  . Diabetes Brother   . Hypertension Maternal Grandmother   . Diabetes Maternal Grandmother   . Breast cancer Cousin   . Throat cancer Maternal Aunt   . Diabetes Son   . Hypertension Daughter     ALLERGIES:  is allergic to penicillins.  MEDICATIONS:  Current Outpatient Medications  Medication Sig Dispense Refill  . amLODipine (NORVASC) 5 MG tablet Take 1 tablet (5 mg total) by mouth daily. 90 tablet 3  . Blood Pressure Monitor DEVI Please provide patient with insurance approved blood pressure monitor 1 each 0  . Multiple Vitamin (MULTIVITAMIN WITH MINERALS) TABS tablet Take 1 tablet by mouth daily. Women's One-A-Day Mulitvitamin    . umeclidinium-vilanterol (ANORO ELLIPTA) 62.5-25 MCG/INH AEPB Inhale 1 puff into the lungs daily. 60 each 1   No current facility-administered medications for this visit.    REVIEW OF SYSTEMS:   Constitutional: ( - ) fevers, ( - )  chills , ( - ) night sweats Eyes: ( - ) blurriness of vision, ( - ) double vision, ( - ) watery eyes Ears, nose, mouth, throat, and face: ( - ) mucositis, ( - ) sore throat Respiratory: ( - ) cough, ( - ) dyspnea, ( - ) wheezes Cardiovascular: ( - ) palpitation, ( - ) chest discomfort, ( - ) lower extremity swelling Gastrointestinal:  ( - ) nausea, ( - ) heartburn, ( - ) change in bowel habits Skin: ( - ) abnormal skin  rashes Lymphatics: ( - ) new lymphadenopathy, ( - ) easy bruising Neurological: ( - ) numbness, ( - ) tingling, ( - ) new weaknesses Behavioral/Psych: ( - ) mood change, ( - ) new changes  All other systems were reviewed with the patient and are negative.  PHYSICAL EXAMINATION: ECOG PERFORMANCE STATUS: 0 - Asymptomatic  Vitals:   12/06/19 1110  BP: (!) 127/86  Pulse: 62  Resp: 18  Temp: (!) 97.2 F (36.2 C)  SpO2: 100%   Filed Weights   12/06/19 1110  Weight: 141 lb 4.8  oz (64.1 kg)    GENERAL:well appearing middle aged [place] American female. alert, no distress and comfortable SKIN: skin color, texture, turgor are normal, no rashes or significant lesions EYES: conjunctiva are pink and non-injected, sclera clear LUNGS: clear to auscultation and percussion with normal breathing effort HEART: regular rate & rhythm and no murmurs and no lower extremity edema Musculoskeletal: no cyanosis of digits and no clubbing  PSYCH: alert & oriented x 3, fluent speech NEURO: no focal motor/sensory deficits  LABORATORY DATA:  I have reviewed the data as listed CBC Latest Ref Rng & Units 12/06/2019 10/07/2019 09/23/2019  WBC 4.0 - 10.5 K/uL 5.0 4.1 3.3(L)  Hemoglobin 12.0 - 15.0 g/dL 12.3 12.9 13.9  Hematocrit 36 - 46 % 35.8(L) 37.8 40.3  Platelets 150 - 400 K/uL 203 119(L) 130(L)    CMP Latest Ref Rng & Units 12/06/2019 10/07/2019 05/05/2019  Glucose 70 - 99 mg/dL 96 78 70  BUN 6 - 20 mg/dL 10 8 6   Creatinine 0.44 - 1.00 mg/dL 0.80 0.83 0.67  Sodium 135 - 145 mmol/L 141 141 139  Potassium 3.5 - 5.1 mmol/L 4.1 4.4 4.4  Chloride 98 - 111 mmol/L 107 106 97  CO2 22 - 32 mmol/L 25 23 24   Calcium 8.9 - 10.3 mg/dL 10.5(H) 9.9 10.2  Total Protein 6.5 - 8.1 g/dL 7.7 8.3(H) 8.0  Total Bilirubin 0.3 - 1.2 mg/dL 0.3 0.3 0.4  Alkaline Phos 38 - 126 U/L 60 80 82  AST 15 - 41 U/L 22 71(H) 130(H)  ALT 0 - 44 U/L 13 37 50(H)   RADIOGRAPHIC STUDIES: No results found.  [place] 59 y.o. female with medical history significant for pulmonary MALT lymphoma who presents for a follow up visit.  At this time it appears that Ms. [name] is tolerating therapy quite well and will be completing radiation tomorrow.  I will defer to radiation oncology for the timing of the posttreatment PET CT scan, though anticipated will be in approximately 12 weeks time.  Patient is otherwise asymptomatic and not having any discerning B symptoms.  We will repeat baseline labs today and have the patient return after the posttreatment imaging has been completed.  # Pulmonary Extranodal MALT Lymphoma, [place] Stage I --patient will finish definitive radiation therapy tomorrow.  --defer to Rad/onc for scheduling of her f/u PET CT scan, though anticipate this will be done approximately 12 weeks time --will order repeat labs today to include LDH, CBC, and CMP  --f/u after the post treatment PET CT scan. Appointment placed for early Nov 2021.   #PET Avidity in the Colon --patient connected with GI, colonoscopy planned for 12/17/2019.   No orders of the defined types were placed in this encounter.   All questions were answered. The patient knows to call the clinic with any problems, questions or concerns.  A total of more than 25 minutes were spent on this encounter and over half of that time was spent on counseling and coordination of care as outlined above.   Sheri Peoples, MD Department of Hematology/Oncology [place] [facility] Phone: [phone] Pager: [phone] Email: [name]  12/07/2019 2:05 PM

## 2019-12-06 ENCOUNTER — Inpatient Hospital Stay: Payer: Medicaid Other

## 2019-12-06 ENCOUNTER — Inpatient Hospital Stay: Payer: Medicaid Other | Attending: Hematology and Oncology | Admitting: Hematology and Oncology

## 2019-12-06 ENCOUNTER — Ambulatory Visit
Admission: RE | Admit: 2019-12-06 | Discharge: 2019-12-06 | Disposition: A | Discharge status: Home / Self Care | Payer: Medicaid Other | Source: Ambulatory Visit | Attending: Radiation Oncology | Admitting: Radiation Oncology

## 2019-12-06 ENCOUNTER — Other Ambulatory Visit: Payer: Self-pay

## 2019-12-06 VITALS — BP 127/86 | HR 62 | Temp 97.2°F | Resp 18 | Ht 63.5 in | Wt 141.3 lb

## 2019-12-06 DIAGNOSIS — C884 Extranodal marginal zone B-cell lymphoma of mucosa-associated lymphoid tissue [MALT-lymphoma]: Secondary | ICD-10-CM

## 2019-12-06 DIAGNOSIS — I10 Essential (primary) hypertension: Secondary | ICD-10-CM | POA: Insufficient documentation

## 2019-12-06 DIAGNOSIS — Z79899 Other long term (current) drug therapy: Secondary | ICD-10-CM | POA: Insufficient documentation

## 2019-12-06 DIAGNOSIS — F17210 Nicotine dependence, cigarettes, uncomplicated: Secondary | ICD-10-CM | POA: Diagnosis not present

## 2019-12-06 DIAGNOSIS — F319 Bipolar disorder, unspecified: Secondary | ICD-10-CM | POA: Diagnosis not present

## 2019-12-06 DIAGNOSIS — J439 Emphysema, unspecified: Secondary | ICD-10-CM | POA: Insufficient documentation

## 2019-12-06 DIAGNOSIS — F209 Schizophrenia, unspecified: Secondary | ICD-10-CM | POA: Insufficient documentation

## 2019-12-06 LAB — CMP (CANCER CENTER ONLY)
ALT: 13 U/L (ref 0–44)
AST: 22 U/L (ref 15–41)
Albumin: 3.8 g/dL (ref 3.5–5.0)
Alkaline Phosphatase: 60 U/L (ref 38–126)
Anion gap: 9 (ref 5–15)
BUN: 10 mg/dL (ref 6–20)
CO2: 25 mmol/L (ref 22–32)
Calcium: 10.5 mg/dL — ABNORMAL HIGH (ref 8.9–10.3)
Chloride: 107 mmol/L (ref 98–111)
Creatinine: 0.8 mg/dL (ref 0.44–1.00)
GFR, Est AFR Am: >60 mL/min (ref >60)
GFR, Estimated: >60 mL/min (ref >60)
Glucose, Bld: 96 mg/dL (ref 70–99)
Potassium: 4.1 mmol/L (ref 3.5–5.1)
Sodium: 141 mmol/L (ref 135–145)
Total Bilirubin: 0.3 mg/dL (ref 0.3–1.2)
Total Protein: 7.7 g/dL (ref 6.5–8.1)

## 2019-12-06 LAB — CBC WITH DIFFERENTIAL (CANCER CENTER ONLY)
Abs Immature Granulocytes: 0.01 10*3/uL (ref 0.00–0.07)
Basophils Absolute: 0 10*3/uL (ref 0.0–0.1)
Basophils Relative: 0 %
Eosinophils Absolute: 0.1 10*3/uL (ref 0.0–0.5)
Eosinophils Relative: 2 %
HCT: 35.8 % — ABNORMAL LOW (ref 36.0–46.0)
Hemoglobin: 12.3 g/dL (ref 12.0–15.0)
Immature Granulocytes: 0 %
Lymphocytes Relative: 25 %
Lymphs Abs: 1.3 10*3/uL (ref 0.7–4.0)
MCH: 34.8 pg — ABNORMAL HIGH (ref 26.0–34.0)
MCHC: 34.4 g/dL (ref 30.0–36.0)
MCV: 101.4 fL — ABNORMAL HIGH (ref 80.0–100.0)
Monocytes Absolute: 0.4 10*3/uL (ref 0.1–1.0)
Monocytes Relative: 7 %
Neutro Abs: 3.2 10*3/uL (ref 1.7–7.7)
Neutrophils Relative %: 66 %
Platelet Count: 203 10*3/uL (ref 150–400)
RBC: 3.53 MIL/uL — ABNORMAL LOW (ref 3.87–5.11)
RDW: 12.2 % (ref 11.5–15.5)
WBC Count: 5 10*3/uL (ref 4.0–10.5)
nRBC: 0 % (ref 0.0–0.2)

## 2019-12-06 LAB — LACTATE DEHYDROGENASE: LDH: 183 U/L (ref 98–192)

## 2019-12-06 MED ORDER — ANORO ELLIPTA 62.5-25 MCG/INH IN AEPB: 1.0000 | INHALATION_SPRAY | Freq: Every day | RESPIRATORY_TRACT | 1 refills | Status: DC

## 2019-12-07 ENCOUNTER — Other Ambulatory Visit: Payer: Self-pay

## 2019-12-07 ENCOUNTER — Telehealth: Payer: Self-pay | Admitting: Hematology and Oncology

## 2019-12-07 ENCOUNTER — Ambulatory Visit
Admission: RE | Admit: 2019-12-07 | Discharge: 2019-12-07 | Disposition: A | Discharge status: Home / Self Care | Payer: Medicaid Other | Source: Ambulatory Visit | Attending: Radiation Oncology | Admitting: Radiation Oncology

## 2019-12-07 ENCOUNTER — Encounter: Payer: Self-pay | Admitting: Hematology and Oncology

## 2019-12-07 ENCOUNTER — Encounter: Payer: Self-pay | Admitting: Radiation Oncology

## 2019-12-07 DIAGNOSIS — C884 Extranodal marginal zone B-cell lymphoma of mucosa-associated lymphoid tissue [MALT-lymphoma]: Secondary | ICD-10-CM | POA: Diagnosis not present

## 2019-12-07 NOTE — Telephone Encounter (Signed)
Scheduled per 7/26 los. Unable to reach pt. Left voicemail with appt time and date.

## 2019-12-15 ENCOUNTER — Other Ambulatory Visit: Payer: Self-pay | Admitting: Hematology and Oncology

## 2019-12-15 DIAGNOSIS — Z1231 Encounter for screening mammogram for malignant neoplasm of breast: Secondary | ICD-10-CM

## 2019-12-17 ENCOUNTER — Other Ambulatory Visit: Payer: Self-pay | Admitting: Gastroenterology

## 2019-12-18 LAB — SARS CORONAVIRUS 2 (TAT 6-24 HRS): SARS Coronavirus 2: NEGATIVE

## 2019-12-19 ENCOUNTER — Encounter: Payer: Self-pay | Admitting: Certified Registered Nurse Anesthetist

## 2019-12-20 ENCOUNTER — Other Ambulatory Visit: Payer: Self-pay

## 2019-12-20 ENCOUNTER — Encounter: Payer: Self-pay | Admitting: Gastroenterology

## 2019-12-20 ENCOUNTER — Ambulatory Visit (AMBULATORY_SURGERY_CENTER): Payer: Medicaid Other | Admitting: Gastroenterology

## 2019-12-20 VITALS — BP 99/67 | HR 60 | Temp 97.6°F | Resp 13 | Ht 63.0 in | Wt 137.0 lb

## 2019-12-20 DIAGNOSIS — R948 Abnormal results of function studies of other organs and systems: Secondary | ICD-10-CM | POA: Diagnosis not present

## 2019-12-20 DIAGNOSIS — Z538 Procedure and treatment not carried out for other reasons: Secondary | ICD-10-CM

## 2019-12-20 MED ORDER — SODIUM CHLORIDE 0.9 % IV SOLN: 500.0000 mL | Freq: Once | INTRAVENOUS | Status: DC

## 2019-12-20 NOTE — Progress Notes (Signed)
Pt's states no medical or surgical changes since previsit or office visit.  Vitals- Courtney 

## 2019-12-20 NOTE — Op Note (Signed)
[facility] Patient Name: [name] Procedure Date: 12/20/2019 10:08 AM MRN: [id number] Endoscopist: Mauri Pole , MD Age: 59 Referring MD:  Date of Birth: 05-Sep-1960 Gender: Female Account #: [id number] Procedure:                Colonoscopy Indications:              Abnormal PET scan of the GI tract Medicines:                Monitored Anesthesia Care Procedure:                Pre-Anesthesia Assessment:                           - Prior to the procedure, a History and Physical                            was performed, and patient medications and                            allergies were reviewed. The patient's tolerance of                            previous anesthesia was also reviewed. The risks                            and benefits of the procedure and the sedation                            options and risks were discussed with the patient.                            All questions were answered, and informed consent                            was obtained. Prior Anticoagulants: The patient has                            taken no previous anticoagulant or antiplatelet                            agents. ASA Grade Assessment: III - A patient with                            severe systemic disease. After reviewing the risks                            and benefits, the patient was deemed in                            satisfactory condition to undergo the procedure.                           After obtaining informed consent, the colonoscope  was passed under direct vision. Throughout the                            procedure, the patient's blood pressure, pulse, and                            oxygen saturations were monitored continuously. The                            Colonoscope was introduced through the anus with                            the intention of advancing to the splenic flexure.                            The scope was  advanced to the sigmoid colon before                            the procedure was aborted. Medications were given.                            The colonoscopy was aborted due to poor bowel prep                            with stool present. The patient tolerated the                            procedure well. The quality of the bowel                            preparation was poor. The rectum was photographed. Scope In: 10:15:09 AM Scope Out: 10:15:33 AM Total Procedure Duration: 0 hours 0 minutes 24 seconds  Findings:                 The perianal and digital rectal examinations were                            normal.                           Solid stool was found in the rectum, in the                            recto-sigmoid colon and in the sigmoid colon,                            precluding visualization. Complications:            No immediate complications. Estimated Blood Loss:     Estimated blood loss: none. Impression:               - The procedure was aborted due to poor bowel prep                            with stool  present.                           - Preparation of the colon was poor.                           - Stool in the rectum, in the recto-sigmoid colon                            and in the sigmoid colon.                           - No specimens collected. Recommendation:           - Patient has a contact number available for                            emergencies. The signs and symptoms of potential                            delayed complications were discussed with the                            patient. Return to normal activities tomorrow.                            Written discharge instructions were provided to the                            patient.                           - Resume previous diet.                           - Continue present medications.                           - Repeat colonoscopy at the next available                             appointment because the bowel preparation was                            suboptimal.                           - For future colonoscopy the patient will require                            an extended preparation. If there are any                            questions, please contact the gastroenterologist. Mauri Pole, MD 12/20/2019 10:21:12 AM This report has been signed electronically.

## 2019-12-20 NOTE — Patient Instructions (Signed)
YOU HAD AN ENDOSCOPIC PROCEDURE TODAY AT [facility]:   Refer to the procedure report that was given to you for any specific questions about what was found during the examination.  If the procedure report does not answer your questions, please call your gastroenterologist to clarify.  If you requested that your care partner not be given the details of your procedure findings, then the procedure report has been included in a sealed envelope for you to review at your convenience later.  YOU SHOULD EXPECT: Some feelings of bloating in the abdomen. Passage of more gas than usual.  Walking can help get rid of the air that was put into your GI tract during the procedure and reduce the bloating. If you had a lower endoscopy (such as a colonoscopy or flexible sigmoidoscopy) you may notice spotting of blood in your stool or on the toilet paper. If you underwent a bowel prep for your procedure, you may not have a normal bowel movement for a few days.  Please Note:  You might notice some irritation and congestion in your nose or some drainage.  This is from the oxygen used during your procedure.  There is no need for concern and it should clear up in a day or so.  SYMPTOMS TO REPORT IMMEDIATELY:   Following lower endoscopy (colonoscopy or flexible sigmoidoscopy):  Excessive amounts of blood in the stool  Significant tenderness or worsening of abdominal pains  Swelling of the abdomen that is new, acute  Fever of 100F or higher  For urgent or emergent issues, a gastroenterologist can be reached at any hour by calling [phone]. Do not use MyChart messaging for urgent concerns.    DIET:  Stay on a clear liquid diet until your procedure.   ACTIVITY:  You should plan to take it easy for the rest of today and you should NOT DRIVE or use heavy machinery until tomorrow (because of the sedation medicines used during the test).    FOLLOW UP: Our staff will call the number listed on your  records 48-72 hours following your procedure to check on you and address any questions or concerns that you may have regarding the information given to you following your procedure. If we do not reach you, we will leave a message.  We will attempt to reach you two times.  During this call, we will ask if you have developed any symptoms of COVID 19. If you develop any symptoms (ie: fever, flu-like symptoms, shortness of breath, cough etc.) before then, please call [phone].  If you test positive for Covid 19 in the 2 weeks post procedure, please call and report this information to [place].    If any biopsies were taken you will be contacted by phone or by letter within the next 1-3 weeks.  Please call us at [phone] if you have not heard about the biopsies in 3 weeks.    SIGNATURES/CONFIDENTIALITY: You and/or your care partner have signed paperwork which will be entered into your electronic medical record.  These signatures attest to the fact that that the information above on your After Visit Summary has been reviewed and is understood.  Full responsibility of the confidentiality of this discharge information lies with you and/or your care-partner.

## 2019-12-20 NOTE — Progress Notes (Signed)
Report given to PACU, vss 

## 2019-12-22 ENCOUNTER — Other Ambulatory Visit: Payer: Self-pay

## 2019-12-22 ENCOUNTER — Ambulatory Visit (AMBULATORY_SURGERY_CENTER): Payer: Medicaid Other | Admitting: Gastroenterology

## 2019-12-22 ENCOUNTER — Encounter: Payer: Self-pay | Admitting: Gastroenterology

## 2019-12-22 VITALS — BP 121/84 | HR 60 | Temp 97.3°F | Resp 12 | Ht 63.0 in | Wt 137.0 lb

## 2019-12-22 DIAGNOSIS — K648 Other hemorrhoids: Secondary | ICD-10-CM

## 2019-12-22 DIAGNOSIS — K5730 Diverticulosis of large intestine without perforation or abscess without bleeding: Secondary | ICD-10-CM | POA: Diagnosis not present

## 2019-12-22 DIAGNOSIS — D125 Benign neoplasm of sigmoid colon: Secondary | ICD-10-CM | POA: Diagnosis not present

## 2019-12-22 DIAGNOSIS — K635 Polyp of colon: Secondary | ICD-10-CM

## 2019-12-22 DIAGNOSIS — D123 Benign neoplasm of transverse colon: Secondary | ICD-10-CM

## 2019-12-22 DIAGNOSIS — R948 Abnormal results of function studies of other organs and systems: Secondary | ICD-10-CM

## 2019-12-22 MED ORDER — SODIUM CHLORIDE 0.9 % IV SOLN
500.0000 mL | INTRAVENOUS | Status: DC
Start: 2019-12-22 — End: 2019-12-22

## 2019-12-22 NOTE — Progress Notes (Signed)
To PACU, VSS. Report to [email or web address] 

## 2019-12-22 NOTE — Op Note (Signed)
[facility] Patient Name: [name] Procedure Date: 12/22/2019 2:32 PM MRN: [id number] Endoscopist: Mauri Pole , MD Age: 59 Referring MD:  Date of Birth: 08/01/1960 Gender: Female Account #: [id number] Procedure:                Colonoscopy Indications:              Abnormal PET scan of the GI tract Medicines:                Monitored Anesthesia Care Procedure:                Pre-Anesthesia Assessment:                           - Prior to the procedure, a History and Physical                            was performed, and patient medications and                            allergies were reviewed. The patient's tolerance of                            previous anesthesia was also reviewed. The risks                            and benefits of the procedure and the sedation                            options and risks were discussed with the patient.                            All questions were answered, and informed consent                            was obtained. Prior Anticoagulants: The patient has                            taken no previous anticoagulant or antiplatelet                            agents. ASA Grade Assessment: II - A patient with                            mild systemic disease. After reviewing the risks                            and benefits, the patient was deemed in                            satisfactory condition to undergo the procedure.                           After obtaining informed consent, the colonoscope  was passed under direct vision. Throughout the                            procedure, the patient's blood pressure, pulse, and                            oxygen saturations were monitored continuously. The                            Colonoscope was introduced through the anus and                            advanced to the the cecum, identified by                            appendiceal orifice and ileocecal  valve. The                            colonoscopy was performed without difficulty. The                            patient tolerated the procedure well. The quality                            of the bowel preparation was good. The ileocecal                            valve, appendiceal orifice, and rectum were                            photographed. Scope In: 2:47:06 PM Scope Out: 3:10:03 PM Scope Withdrawal Time: 0 hours 17 minutes 56 seconds  Total Procedure Duration: 0 hours 22 minutes 57 seconds  Findings:                 The perianal and digital rectal examinations were                            normal.                           A 15 mm polyp was found in the distal transverse                            colon. The polyp was flat. Preparations were made                            for mucosal resection. Saline injection was done to                            mark the borders of the lesion. Saline was injected                            to raise and delineate the lesion. Hot snare  mucosal resection was performed with cautery.                            Resection and retrieval were complete.                           A 7 mm polyp was found in the recto-sigmoid colon.                            The polyp was pedunculated. The polyp was removed                            with a hot snare. Resection and retrieval were                            complete.                           Scattered small and large-mouthed diverticula were                            found in the sigmoid colon and descending colon.                            Peri-diverticular erythema was seen.                           Non-bleeding internal hemorrhoids were found during                            retroflexion. The hemorrhoids were small.                           The exam was otherwise without abnormality. Complications:            No immediate complications. Estimated Blood Loss:      Estimated blood loss was minimal. Impression:               - One 15 mm polyp in the distal transverse colon,                            removed with mucosal resection. Resected and                            retrieved.                           - One 7 mm polyp at the recto-sigmoid colon,                            removed with a hot snare. Resected and retrieved.                           - Moderate diverticulosis in the sigmoid colon and  in the descending colon. Peri-diverticular erythema                            was seen.                           - Non-bleeding internal hemorrhoids.                           - The examination was otherwise normal.                           - Mucosal resection was performed. Resection and                            retrieval were complete. Recommendation:           - Patient has a contact number available for                            emergencies. The signs and symptoms of potential                            delayed complications were discussed with the                            patient. Return to normal activities tomorrow.                            Written discharge instructions were provided to the                            patient.                           - Resume previous diet.                           - Continue present medications.                           - Await pathology results.                           - Repeat colonoscopy in 3 - 5 years for                            surveillance based on pathology results. Mauri Pole, MD 12/22/2019 3:35:21 PM This report has been signed electronically.

## 2019-12-22 NOTE — Progress Notes (Signed)
Called to room to assist during endoscopic procedure.  Patient ID and intended procedure confirmed with present staff. Received instructions for my participation in the procedure from the performing physician.  

## 2019-12-22 NOTE — Patient Instructions (Signed)
Handout on polyps, diverticulosis, and hemorrhoids given. No ibuprofen, aleve, naproxen or other non- steroidal anti-inflammatory drugs for two weeks.   YOU HAD AN ENDOSCOPIC PROCEDURE TODAY AT [facility]:   Refer to the procedure report that was given to you for any specific questions about what was found during the examination.  If the procedure report does not answer your questions, please call your gastroenterologist to clarify.  If you requested that your care partner not be given the details of your procedure findings, then the procedure report has been included in a sealed envelope for you to review at your convenience later.  YOU SHOULD EXPECT: Some feelings of bloating in the abdomen. Passage of more gas than usual.  Walking can help get rid of the air that was put into your GI tract during the procedure and reduce the bloating. If you had a lower endoscopy (such as a colonoscopy or flexible sigmoidoscopy) you may notice spotting of blood in your stool or on the toilet paper. If you underwent a bowel prep for your procedure, you may not have a normal bowel movement for a few days.  Please Note:  You might notice some irritation and congestion in your nose or some drainage.  This is from the oxygen used during your procedure.  There is no need for concern and it should clear up in a day or so.  SYMPTOMS TO REPORT IMMEDIATELY:   Following lower endoscopy (colonoscopy or flexible sigmoidoscopy):  Excessive amounts of blood in the stool  Significant tenderness or worsening of abdominal pains  Swelling of the abdomen that is new, acute  Fever of 100F or higher    For urgent or emergent issues, a gastroenterologist can be reached at any hour by calling [phone]. Do not use MyChart messaging for urgent concerns.    DIET:  We do recommend a small meal at first, but then you may proceed to your regular diet.  Drink plenty of fluids but you should avoid alcoholic  beverages for 24 hours.  ACTIVITY:  You should plan to take it easy for the rest of today and you should NOT DRIVE or use heavy machinery until tomorrow (because of the sedation medicines used during the test).    FOLLOW UP: Our staff will call the number listed on your records 48-72 hours following your procedure to check on you and address any questions or concerns that you may have regarding the information given to you following your procedure. If we do not reach you, we will leave a message.  We will attempt to reach you two times.  During this call, we will ask if you have developed any symptoms of COVID 19. If you develop any symptoms (ie: fever, flu-like symptoms, shortness of breath, cough etc.) before then, please call [phone].  If you test positive for Covid 19 in the 2 weeks post procedure, please call and report this information to [place].    If any biopsies were taken you will be contacted by phone or by letter within the next 1-3 weeks.  Please call us at [phone] if you have not heard about the biopsies in 3 weeks.    SIGNATURES/CONFIDENTIALITY: You and/or your care partner have signed paperwork which will be entered into your electronic medical record.  These signatures attest to the fact that that the information above on your After Visit Summary has been reviewed and is understood.  Full responsibility of the confidentiality of this discharge information lies  with you and/or your care-partner.

## 2019-12-22 NOTE — Progress Notes (Signed)
Vs CW ° °

## 2019-12-23 ENCOUNTER — Inpatient Hospital Stay: Payer: Self-pay | Admitting: Hematology and Oncology

## 2019-12-23 ENCOUNTER — Inpatient Hospital Stay: Payer: Self-pay

## 2019-12-23 ENCOUNTER — Telehealth: Payer: Self-pay | Admitting: Hematology and Oncology

## 2019-12-23 NOTE — Telephone Encounter (Signed)
Scheduled appointments per 8/11 scheduling message. Left message for patient with appointments date and times.

## 2019-12-24 ENCOUNTER — Telehealth: Payer: Self-pay

## 2019-12-24 NOTE — Telephone Encounter (Signed)
  Follow up Call-  Call back number 12/22/2019 12/20/2019  Post procedure Call Back phone  # [phone]  Permission to leave phone message Yes Yes  Some recent data might be hidden     Patient questions:  Do you have a fever, pain , or abdominal swelling? No. Pain Score  0 *  Have you tolerated food without any problems? Yes.    Have you been able to return to your normal activities? Yes.    Do you have any questions about your discharge instructions: Diet   No. Medications  No. Follow up visit  No.  Do you have questions or concerns about your Care? No.  Actions: * If pain score is 4 or above: No action needed, pain <4.  1. Have you developed a fever since your procedure? no  2.   Have you had an respiratory symptoms (SOB or cough) since your procedure? no  3.   Have you tested positive for COVID 19 since your procedure no  4.   Have you had any family members/close contacts diagnosed with the COVID 19 since your procedure?  no   If yes to any of these questions please route to Joylene John, RN and Joella Prince, RN

## 2019-12-31 ENCOUNTER — Encounter: Payer: Self-pay | Admitting: Gastroenterology

## 2020-01-06 ENCOUNTER — Telehealth: Payer: Self-pay

## 2020-01-06 NOTE — Telephone Encounter (Signed)
I called the patient today about their upcoming follow-up appointment in radiation oncology.   Given the state of the  COVID-19 pandemic, concerning case numbers in our community, and guidance from [facility], I offered a phone assessment with the patient to determine if coming to the clinic was necessary. The patient accepted.  I let the patient know that I had spoken with Dr. Isidore Moos, and she wanted them to know the importance of washing their hands for at least 20 seconds at a time, especially after going out in public, and before they eat. Limit going out in public whenever possible. Do not touch your face, unless your hands are clean, such as when bathing. Get plenty of rest, eat well, and stay hydrated. Patient verbalized understanding and agreement.   Symptomatically, the patient is doing relatively well. She reports feeling well, and denies any fatigue or change in activity level. She reports a healthy appetite and denies any issues swallowing, or reflux/esophagitis symptoms. She denies any worsening shortness of breath or coughing. Her skin irritation in the treatment field has resolved and she reports intact skin conditions. She does admit that she continues to smoke ~4 cigarettes a day, but she is trying quit. She states she still has not received her as needed inhaler, and asked if I would reach to Dr. Lorenso Courier in her behalf.   All questions were answered to the patient's satisfaction.  I encouraged the patient to call with any further questions. Otherwise, the plan is to follow up with Dr. Narda Rutherford on 03/13/2020, and reach out to radiation department as needed for any future questions/concerns.    Patient is pleased with this plan, and we will cancel their upcoming follow-up to reduce the risk of COVID-19 transmission.

## 2020-01-07 ENCOUNTER — Ambulatory Visit: Payer: Medicaid Other | Admitting: Radiation Oncology

## 2020-01-13 ENCOUNTER — Other Ambulatory Visit: Payer: Self-pay

## 2020-01-13 ENCOUNTER — Ambulatory Visit
Admission: RE | Admit: 2020-01-13 | Discharge: 2020-01-13 | Disposition: A | Discharge status: Home / Self Care | Payer: Medicaid Other | Source: Ambulatory Visit | Attending: Hematology and Oncology | Admitting: Hematology and Oncology

## 2020-01-13 DIAGNOSIS — Z1231 Encounter for screening mammogram for malignant neoplasm of breast: Secondary | ICD-10-CM

## 2020-02-02 NOTE — Progress Notes (Signed)
  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   Plan: The patient will follow-up with radiation oncology in 1 mo, or as needed. -----------------------------------  Eppie Gibson, MD

## 2020-03-13 ENCOUNTER — Inpatient Hospital Stay: Payer: Self-pay | Admitting: Hematology and Oncology

## 2020-03-13 ENCOUNTER — Inpatient Hospital Stay: Payer: Self-pay

## 2020-03-24 ENCOUNTER — Inpatient Hospital Stay (HOSPITAL_BASED_OUTPATIENT_CLINIC_OR_DEPARTMENT_OTHER): Payer: Medicaid Other | Admitting: Hematology and Oncology

## 2020-03-24 ENCOUNTER — Other Ambulatory Visit: Payer: Self-pay

## 2020-03-24 ENCOUNTER — Other Ambulatory Visit: Payer: Self-pay | Admitting: Hematology and Oncology

## 2020-03-24 ENCOUNTER — Encounter: Payer: Self-pay | Admitting: Hematology and Oncology

## 2020-03-24 ENCOUNTER — Inpatient Hospital Stay: Payer: Medicaid Other | Attending: Hematology and Oncology

## 2020-03-24 VITALS — BP 140/91 | HR 87 | Temp 97.4°F | Resp 17 | Ht 63.0 in | Wt 151.2 lb

## 2020-03-24 DIAGNOSIS — C884 Extranodal marginal zone B-cell lymphoma of mucosa-associated lymphoid tissue [MALT-lymphoma]: Secondary | ICD-10-CM

## 2020-03-24 DIAGNOSIS — Z79899 Other long term (current) drug therapy: Secondary | ICD-10-CM | POA: Insufficient documentation

## 2020-03-24 DIAGNOSIS — F17210 Nicotine dependence, cigarettes, uncomplicated: Secondary | ICD-10-CM | POA: Diagnosis not present

## 2020-03-24 DIAGNOSIS — F319 Bipolar disorder, unspecified: Secondary | ICD-10-CM | POA: Insufficient documentation

## 2020-03-24 DIAGNOSIS — I10 Essential (primary) hypertension: Secondary | ICD-10-CM | POA: Insufficient documentation

## 2020-03-24 DIAGNOSIS — J439 Emphysema, unspecified: Secondary | ICD-10-CM | POA: Diagnosis not present

## 2020-03-24 DIAGNOSIS — Z923 Personal history of irradiation: Secondary | ICD-10-CM | POA: Insufficient documentation

## 2020-03-24 DIAGNOSIS — F209 Schizophrenia, unspecified: Secondary | ICD-10-CM | POA: Insufficient documentation

## 2020-03-24 LAB — CBC WITH DIFFERENTIAL (CANCER CENTER ONLY)
Abs Immature Granulocytes: 0.02 10*3/uL (ref 0.00–0.07)
Basophils Absolute: 0 10*3/uL (ref 0.0–0.1)
Basophils Relative: 0 %
Eosinophils Absolute: 0 10*3/uL (ref 0.0–0.5)
Eosinophils Relative: 1 %
HCT: 34.6 % — ABNORMAL LOW (ref 36.0–46.0)
Hemoglobin: 12.1 g/dL (ref 12.0–15.0)
Immature Granulocytes: 0 %
Lymphocytes Relative: 35 %
Lymphs Abs: 1.7 10*3/uL (ref 0.7–4.0)
MCH: 31.8 pg (ref 26.0–34.0)
MCHC: 35 g/dL (ref 30.0–36.0)
MCV: 90.8 fL (ref 80.0–100.0)
Monocytes Absolute: 0.3 10*3/uL (ref 0.1–1.0)
Monocytes Relative: 7 %
Neutro Abs: 2.8 10*3/uL (ref 1.7–7.7)
Neutrophils Relative %: 57 %
Platelet Count: 156 10*3/uL (ref 150–400)
RBC: 3.81 MIL/uL — ABNORMAL LOW (ref 3.87–5.11)
RDW: 14.3 % (ref 11.5–15.5)
WBC Count: 4.9 10*3/uL (ref 4.0–10.5)
nRBC: 0 % (ref 0.0–0.2)

## 2020-03-24 LAB — CMP (CANCER CENTER ONLY)
ALT: 19 U/L (ref 0–44)
AST: 44 U/L — ABNORMAL HIGH (ref 15–41)
Albumin: 4.4 g/dL (ref 3.5–5.0)
Alkaline Phosphatase: 86 U/L (ref 38–126)
Anion gap: 14 (ref 5–15)
BUN: 10 mg/dL (ref 6–20)
CO2: 24 mmol/L (ref 22–32)
Calcium: 9.5 mg/dL (ref 8.9–10.3)
Chloride: 102 mmol/L (ref 98–111)
Creatinine: 0.85 mg/dL (ref 0.44–1.00)
GFR, Estimated: >60 mL/min (ref >60)
Glucose, Bld: 78 mg/dL (ref 70–99)
Potassium: 4 mmol/L (ref 3.5–5.1)
Sodium: 140 mmol/L (ref 135–145)
Total Bilirubin: 0.5 mg/dL (ref 0.3–1.2)
Total Protein: 8.5 g/dL — ABNORMAL HIGH (ref 6.5–8.1)

## 2020-03-24 LAB — LACTATE DEHYDROGENASE: LDH: 224 U/L — ABNORMAL HIGH (ref 98–192)

## 2020-03-25 ENCOUNTER — Encounter: Payer: Self-pay | Admitting: Hematology and Oncology

## 2020-03-25 NOTE — Progress Notes (Signed)
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 PET-CT suggested. 2) 07/19/2019: PET CT performed showed irregular medial right upper lobe nodule shown on recent CT persists and has a maximum SUV of 2.5. Additionally there was noted to be colon activity  3) 08/23/2019: Right Upper Lobe Wedge resection planned. Patient declined this procedure. 4)  09/23/2019: CT guided biopsy of the lesion performed, findings consistent with an extranodal marginal zone lymphoma of mucosal associated tissue (MALT) lymphoma is favored. 5) 10/07/2019: establish care with Dr. Lorenso Courier  6) 11/05/2019: established care with Dr. Isidore Moos in [place]. Started treatment on 11/18/2019.  7) 12/07/2019: ended radiation treatment  Interval History:  [name] 59 y.o. female with medical history significant for pulmonary MALT lymphoma who presents for a follow up visit. The patient's last visit was on 12/06/2019. In the interim since the last visit she has completed radiation therapy.  On exam today [name] reports she has been well in the interim since her last visit.  She has had no problems with her daughter radiation therapy and endorses no rash, shortness of breath, cough, or other symptoms.  She reports that she is "feeling good".  And that she is actually gaining weight since she completed radiation therapy.  She continues to smoke and reports that it is "very difficult to stop smoking".  Otherwise she denies having issues with fevers, chills, sweats, nausea, or diarrhea.  A full 10 point ROS is listed below.  MEDICAL HISTORY:  Past Medical History:  Diagnosis Date  . Anemia 1981   During chilbirth  . Bipolar 1 disorder ([place])   . Depression    . Emphysema lung ([place])   . Hypertension   . Lymphoma ([place])   . Mental disorder   . Schizophrenia ([place])     SURGICAL HISTORY: Past Surgical History:  Procedure Laterality Date  . NECK SURGERY     cyst  . TUBAL LIGATION      SOCIAL HISTORY: Social History   Socioeconomic History  . Marital status: Divorced    Spouse name: Not on file  . Number of children: 3  . Years of education: Not on file  . Highest education level: 9th grade  Occupational History  . Occupation: retired  Tobacco Use  . Smoking status: Current Some Day Smoker    Packs/day: 0.75    Years: 42.00    Pack years: 31.50    Types: Cigarettes  . Smokeless tobacco: Former Systems developer    Types: Snuff  . Tobacco comment: pt smoking about 2 cig per day  Vaping Use  . Vaping Use: Never used  Substance and Sexual Activity  . 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 Drug use: Yes    Frequency: 2.0 times per week    Types: Marijuana    Comment: last use of weed three days ago  . Sexual activity: Yes    Birth control/protection: Surgical  Other Topics Concern  . Not on file  Social History Narrative  . Not on file   Social Determinants of Health   Financial Resource Strain:   . Difficulty of Paying  Living Expenses: Not on file  Food Insecurity:   . Worried About Charity fundraiser in the Last Year: Not on file  . Ran Out of Food in the Last Year: Not on file  Transportation Needs:   . Lack of Transportation (Medical): Not on file  . Lack of Transportation (Non-Medical): Not on file  Physical Activity:   . Days of Exercise per Week: Not on file  . Minutes of Exercise per Session: Not on file  Stress:   . Feeling of Stress : Not on file  Social Connections:   . Frequency of Communication with Friends and Family: Not on file  . Frequency of Social Gatherings with Friends and Family: Not on file  . Attends  Religious Services: Not on file  . Active Member of Clubs or Organizations: Not on file  . Attends Archivist Meetings: Not on file  . Marital Status: Not on file  Intimate Partner Violence:   . Fear of Current or Ex-Partner: Not on file  . Emotionally Abused: Not on file  . Physically Abused: Not on file  . Sexually Abused: Not on file    FAMILY HISTORY: Family History  Problem Relation Age of Onset  . Breast cancer Maternal Aunt   . Diabetes Maternal Aunt   . Breast cancer Maternal Aunt   . Breast cancer Maternal Aunt   . Diabetes Mother   . Hypertension Mother   . Colon cancer Mother   . Lung cancer Father        smoker  . Diabetes Brother   . Hypertension Maternal Grandmother   . Diabetes Maternal Grandmother   . Breast cancer Cousin   . Throat cancer Maternal Aunt   . Diabetes Son   . Hypertension Daughter     ALLERGIES:  is allergic to penicillins.  MEDICATIONS:  Current Outpatient Medications  Medication Sig Dispense Refill  . amLODipine (NORVASC) 5 MG tablet Take 1 tablet (5 mg total) by mouth daily. 90 tablet 3  . Blood Pressure Monitor DEVI Please provide patient with insurance approved blood pressure monitor 1 each 0  . Multiple Vitamin (MULTIVITAMIN WITH MINERALS) TABS tablet Take 1 tablet by mouth daily. Women's One-A-Day Mulitvitamin    . umeclidinium-vilanterol (ANORO ELLIPTA) 62.5-25 MCG/INH AEPB Inhale 1 puff into the lungs daily. (Patient not taking: Reported on 12/20/2019) 60 each 1   No current facility-administered medications for this visit.    REVIEW OF SYSTEMS:   Constitutional: ( - ) fevers, ( - )  chills , ( - ) night sweats Eyes: ( - ) blurriness of vision, ( - ) double vision, ( - ) watery eyes Ears, nose, mouth, throat, and face: ( - ) mucositis, ( - ) sore throat Respiratory: ( - ) cough, ( - ) dyspnea, ( - ) wheezes Cardiovascular: ( - ) palpitation, ( - ) chest discomfort, ( - ) lower extremity swelling Gastrointestinal:  ( -  ) nausea, ( - ) heartburn, ( - ) change in bowel habits Skin: ( - ) abnormal skin rashes Lymphatics: ( - ) new lymphadenopathy, ( - ) easy bruising Neurological: ( - ) numbness, ( - ) tingling, ( - ) new weaknesses Behavioral/Psych: ( - ) mood change, ( - ) new changes  All other systems were reviewed with the patient and are negative.  PHYSICAL EXAMINATION: ECOG PERFORMANCE STATUS: 0 - Asymptomatic  Vitals:   03/24/20 1126  BP: (!) 140/91  Pulse: 87  Resp: 17  Temp: (!) 97.4 F (36.3 C)  SpO2: 100%   Filed Weights   03/24/20 1126  Weight: 151 lb 3.2 oz (68.6 kg)    GENERAL:well appearing middle aged [place] American female. alert, no distress and comfortable SKIN: skin color, texture, turgor are normal, no rashes or significant lesions EYES: conjunctiva are pink and non-injected, sclera clear LUNGS: clear to auscultation and percussion with normal breathing effort HEART: regular rate & rhythm and no murmurs and no lower extremity edema Musculoskeletal: no cyanosis of digits and no clubbing  PSYCH: alert & oriented x 3, fluent speech NEURO: no focal motor/sensory deficits  LABORATORY DATA:  I have reviewed the data as listed CBC Latest Ref Rng & Units 03/24/2020 12/06/2019 10/07/2019  WBC 4.0 - 10.5 K/uL 4.9 5.0 4.1  Hemoglobin 12.0 - 15.0 g/dL 12.1 12.3 12.9  Hematocrit 36 - 46 % 34.6(L) 35.8(L) 37.8  Platelets 150 - 400 K/uL 156 203 119(L)    CMP Latest Ref Rng & Units 03/24/2020 12/06/2019 10/07/2019  Glucose 70 - 99 mg/dL 78 96 78  BUN 6 - 20 mg/dL 10 10 8   Creatinine 0.44 - 1.00 mg/dL 0.85 0.80 0.83  Sodium 135 - 145 mmol/L 140 141 141  Potassium 3.5 - 5.1 mmol/L 4.0 4.1 4.4  Chloride 98 - 111 mmol/L 102 107 106  CO2 22 - 32 mmol/L 24 25 23   Calcium 8.9 - 10.3 mg/dL 9.5 10.5(H) 9.9  Total Protein 6.5 - 8.1 g/dL 8.5(H) 7.7 8.3(H)  Total Bilirubin 0.3 - 1.2 mg/dL 0.5 0.3 0.3  Alkaline Phos 38 - 126 U/L 86 60 80  AST 15 - 41 U/L 44(H) 22 71(H)  ALT 0 - 44 U/L 19  13 37   RADIOGRAPHIC STUDIES: No results found.  [place] 59 y.o. female with medical history significant for pulmonary MALT lymphoma who presents for a follow up visit.   Today we ordered the patients posttreatment PET CT scan, (approximately 12 weeks after completion of therapy).  Patient is otherwise asymptomatic and not having any concerning B symptoms.  We will repeat baseline labs today and have the patient return after in approximately 6 months, or sooner if there are concerning findings on the PET CT.   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  --today we ordered her f/u PET CT scan, can now be performed since we are approximately 12 weeks out from the end of treatment.  --will order repeat labs today to include LDH, CBC, and CMP  --RTC in 6 months time or sooner if there are concerning finding on the PET CT scan.   #PET Avidity in the Colon --patient connected with GI, colonoscopy performed that did reveal polyps, but no active malignancy   No orders of the defined types were placed in this encounter.   All questions were answered. The patient knows to call the clinic with any problems, questions or concerns.  A total of more than 30 minutes were spent on this encounter and over half of that time was spent on counseling and coordination of care as outlined above.   Ledell Peoples, MD Department of Hematology/Oncology [place] [facility] Phone: [phone] Pager: [phone] Email: [email or web address]  03/25/2020 2:46 PM

## 2020-03-27 ENCOUNTER — Telehealth: Payer: Self-pay | Admitting: Hematology and Oncology

## 2020-03-27 NOTE — Telephone Encounter (Signed)
Scheduled per los. Called and left msg. Mailed printout  °

## 2020-04-21 ENCOUNTER — Other Ambulatory Visit: Payer: Self-pay | Admitting: Nurse Practitioner

## 2020-04-21 DIAGNOSIS — I10 Essential (primary) hypertension: Secondary | ICD-10-CM

## 2020-04-21 NOTE — Telephone Encounter (Signed)
Requested medication (s) are due for refill today: Yes  Requested medication (s) are on the active medication list: Yes  Last refill: 1 year ago and 2 months ago  Future visit scheduled: Yes  Notes to clinic:  Unable to refill per protocol, expired Rx, last refilled by another provider     Requested Prescriptions  Pending Prescriptions Disp Refills   amLODipine (NORVASC) 5 MG tablet [Pharmacy Med Name: AMLODIPINE BESYLATE 5 MG TAB] 90 tablet 3    Sig: TAKE 1 TABLET BY MOUTH EVERY DAY      Cardiovascular:  Calcium Channel Blockers Failed - 04/21/2020  2:32 PM      Failed - Last BP in normal range    BP Readings from Last 1 Encounters:  03/24/20 (!) 140/91          Failed - Valid encounter within last 6 months    Recent Outpatient Visits           1 year ago Essential hypertension   [place], Vernia Buff, NP       Future Appointments             In 1 month Gildardo Pounds, NP [place]               umeclidinium-vilanterol (ANORO ELLIPTA) 62.5-25 MCG/INH AEPB 60 each 1    Sig: Inhale 1 puff into the lungs daily.      There is no refill protocol information for this order

## 2020-04-21 NOTE — Telephone Encounter (Signed)
Patient's daughter Phineas Real called and advised the patient hasn't been seen in a year and will need an appointment scheduled, she agrees. Appointment for CPE scheduled on Monday, 05/23/19 at 0830 with Geryl Rankins, NP.

## 2020-04-25 MED ORDER — ANORO ELLIPTA 62.5-25 MCG/INH IN AEPB: 1.0000 | INHALATION_SPRAY | Freq: Every day | RESPIRATORY_TRACT | 0 refills | Status: DC

## 2020-05-09 ENCOUNTER — Other Ambulatory Visit: Payer: Self-pay | Admitting: Family Medicine

## 2020-05-09 DIAGNOSIS — I10 Essential (primary) hypertension: Secondary | ICD-10-CM

## 2020-05-22 ENCOUNTER — Encounter: Payer: Self-pay | Admitting: Nurse Practitioner

## 2020-05-22 ENCOUNTER — Other Ambulatory Visit: Payer: Self-pay

## 2020-05-22 ENCOUNTER — Ambulatory Visit: Payer: Medicaid Other | Attending: Nurse Practitioner | Admitting: Nurse Practitioner

## 2020-05-22 VITALS — BP 117/75 | HR 76 | Temp 98.5°F | Ht 66.0 in | Wt 150.0 lb

## 2020-05-22 DIAGNOSIS — F17210 Nicotine dependence, cigarettes, uncomplicated: Secondary | ICD-10-CM

## 2020-05-22 DIAGNOSIS — I10 Essential (primary) hypertension: Secondary | ICD-10-CM

## 2020-05-22 DIAGNOSIS — J449 Chronic obstructive pulmonary disease, unspecified: Secondary | ICD-10-CM

## 2020-05-22 DIAGNOSIS — Z0100 Encounter for examination of eyes and vision without abnormal findings: Secondary | ICD-10-CM

## 2020-05-22 DIAGNOSIS — F259 Schizoaffective disorder, unspecified: Secondary | ICD-10-CM

## 2020-05-22 DIAGNOSIS — Z0000 Encounter for general adult medical examination without abnormal findings: Secondary | ICD-10-CM

## 2020-05-22 DIAGNOSIS — B078 Other viral warts: Secondary | ICD-10-CM | POA: Diagnosis not present

## 2020-05-22 MED ORDER — ANORO ELLIPTA 62.5-25 MCG/INH IN AEPB: 1.0000 | INHALATION_SPRAY | Freq: Every day | RESPIRATORY_TRACT | 0 refills | Status: DC

## 2020-05-22 MED ORDER — AMLODIPINE BESYLATE 5 MG PO TABS: 5.0000 mg | ORAL_TABLET | Freq: Every day | ORAL | 1 refills | Status: DC

## 2020-05-22 MED ORDER — NICOTINE 14 MG/24HR TD PT24: 14.0000 mg | MEDICATED_PATCH | Freq: Every day | TRANSDERMAL | 0 refills | Status: AC

## 2020-05-22 NOTE — Progress Notes (Signed)
Assessment & Plan:  [name] was seen today for annual exam.  Diagnoses and all orders for this visit:  Annual physical exam  Encounter for complete eye exam -     Ambulatory referral to Ophthalmology  Palmar wart -     Ambulatory referral to Hand Surgery  Essential hypertension -     amLODipine (NORVASC) 5 MG tablet; Take 1 tablet (5 mg total) by mouth daily. Continue all antihypertensives as prescribed.  Remember to bring in your blood pressure log with you for your follow up appointment.  DASH/Mediterranean Diets are healthier choices for HTN.   Schizoaffective disorder, unspecified type [facility]) She is not currently seeing a psychiatrist or being treated medically.    COPD mixed type ([place]) -     umeclidinium-vilanterol (ANORO ELLIPTA) 62.5-25 MCG/INH AEPB; Inhale 1 puff into the lungs daily.  Tobacco dependence due to cigarettes -     nicotine (NICODERM CQ - DOSED IN MG/24 HOURS) 14 mg/24hr patch; Place 1 patch (14 mg total) onto the skin daily.    Patient has been counseled on age-appropriate routine health concerns for screening and prevention. These are reviewed and up-to-date. Referrals have been placed accordingly. Immunizations are up-to-date or declined.    Subjective:   Chief Complaint  Patient presents with  . Annual Exam    Pt. Is here for a physical.    HPI [name] 60 y.o. female presents to office today for annual physical. She declined pap smear today.  Mammogram is UTD.   Review of Systems  Constitutional: Negative for fever, malaise/fatigue and weight loss.  HENT: Negative.  Negative for nosebleeds.   Eyes: Negative.  Negative for blurred vision, double vision and photophobia.  Respiratory: Negative.  Negative for cough and shortness of breath.   Cardiovascular: Negative.  Negative for chest pain, palpitations and leg swelling.  Gastrointestinal: Negative.  Negative for heartburn, nausea and vomiting.  Genitourinary: Negative.    Musculoskeletal: Negative.  Negative for myalgias.  Skin:       Growth on right palm  Neurological: Negative.  Negative for dizziness, focal weakness, seizures and headaches.  Endo/Heme/Allergies: Negative.   Psychiatric/Behavioral: Negative.  Negative for suicidal ideas.    Past Medical History:  Diagnosis Date  . Anemia 1981   During chilbirth  . Bipolar 1 disorder ([place])   . Depression   . Emphysema lung ([place])   . Hypertension   . Lymphoma ([place])   . Mental disorder   . Schizophrenia [facility])     Past Surgical History:  Procedure Laterality Date  . NECK SURGERY     cyst  . TUBAL LIGATION      Family History  Problem Relation Age of Onset  . Breast cancer Maternal Aunt   . Diabetes Maternal Aunt   . Breast cancer Maternal Aunt   . Breast cancer Maternal Aunt   . Diabetes Mother   . Hypertension Mother   . Colon cancer Mother   . Lung cancer Father        smoker  . Diabetes Brother   . Hypertension Maternal Grandmother   . Diabetes Maternal Grandmother   . Breast cancer Cousin   . Throat cancer Maternal Aunt   . Diabetes Son   . Hypertension Daughter     Social History Reviewed with no changes to be made today.   Outpatient Medications Prior to Visit  Medication Sig Dispense Refill  . Blood Pressure Monitor DEVI Please provide patient with insurance approved blood pressure  monitor 1 each 0  . Multiple Vitamin (MULTIVITAMIN WITH MINERALS) TABS tablet Take 1 tablet by mouth daily. Women's One-A-Day Mulitvitamin    . amLODipine (NORVASC) 5 MG tablet TAKE 1 TABLET BY MOUTH EVERY DAY 30 tablet 0  . umeclidinium-vilanterol (ANORO ELLIPTA) 62.5-25 MCG/INH AEPB Inhale 1 puff into the lungs daily. 60 each 0   No facility-administered medications prior to visit.    Allergies  Allergen Reactions  . Penicillins Hives and Swelling    REACTION: swelling of tongue; hives Did it involve swelling of the face/tongue/throat, SOB, or low BP? Yes Did it involve sudden or  severe rash/hives, skin peeling, or any reaction on the inside of your mouth or nose? No Did you need to seek medical attention at a hospital or doctor's office? Yes When did it last happen?~2014 If all above answers are "NO", may proceed with cephalosporin use.        Objective:    BP 117/75 (BP Location: Left Arm, Patient Position: Sitting, Cuff Size: Normal)   Pulse 76   Temp 98.5 F (36.9 C) (Oral)   Ht 5\' 6"  (1.676 m)   Wt 150 lb (68 kg)   LMP 10/25/2012   SpO2 99%   BMI 24.21 kg/m  Wt Readings from Last 3 Encounters:  05/22/20 150 lb (68 kg)  03/24/20 151 lb 3.2 oz (68.6 kg)  12/22/19 137 lb (62.1 kg)    Physical Exam Constitutional:      Appearance: She is well-developed and well-nourished.  HENT:     Head: Normocephalic and atraumatic.     Right Ear: External ear normal.     Left Ear: External ear normal.     Nose: Nose normal.     Mouth/Throat:     Mouth: Oropharynx is clear and moist.     Pharynx: No oropharyngeal exudate.  Eyes:     General: No scleral icterus.       Right eye: No discharge.     Extraocular Movements:     Right eye: Abnormal extraocular motion present. No nystagmus.     Left eye: Abnormal extraocular motion present. No nystagmus.     Conjunctiva/sclera: Conjunctivae normal.     Pupils: Pupils are equal, round, and reactive to light.  Neck:     Thyroid: No thyromegaly.     Trachea: No tracheal deviation.  Cardiovascular:     Rate and Rhythm: Normal rate and regular rhythm.     Pulses: Intact distal pulses.     Heart sounds: Normal heart sounds. No murmur heard. No friction rub.  Pulmonary:     Effort: Pulmonary effort is normal. No accessory muscle usage or respiratory distress.     Breath sounds: Normal breath sounds. No decreased breath sounds, wheezing, rhonchi or rales.  Chest:     Chest wall: No tenderness.  Breasts: Breasts are symmetrical.     Right: No inverted nipple, mass, nipple discharge, skin change or tenderness.      Left: No inverted nipple, mass, nipple discharge, skin change or tenderness.    Abdominal:     General: Bowel sounds are normal. There is no distension.     Palpations: Abdomen is soft. There is no mass.     Tenderness: There is no abdominal tenderness. There is no guarding or rebound.  Musculoskeletal:        General: No tenderness, deformity or edema. Normal range of motion.     Cervical back: Normal range of motion and neck supple.  Lymphadenopathy:  Cervical: No cervical adenopathy.  Skin:    General: Skin is warm and dry.     Findings: No erythema.       Neurological:     Mental Status: She is alert and oriented to person, place, and time.     Cranial Nerves: No cranial nerve deficit.     Coordination: Coordination normal.     Deep Tendon Reflexes: Reflexes are normal and symmetric.     Reflex Scores:      Patellar reflexes are 2+ on the right side and 2+ on the left side. Psychiatric:        Mood and Affect: Mood and affect normal.        Speech: Speech normal.        Behavior: Behavior normal.        Thought Content: Thought content normal.        Judgment: Judgment normal.          Patient has been counseled extensively about nutrition and exercise as well as the importance of adherence with medications and regular follow-up. The patient was given clear instructions to go to ER or return to medical center if symptoms don't improve, worsen or new problems develop. The patient verbalized understanding.   Follow-up: Return in about 3 months (around 08/20/2020).   Claiborne Rigg, FNP-BC [facility] and [facility] [place], [place] [phone]   05/22/2020, 9:00 AM

## 2020-06-01 ENCOUNTER — Ambulatory Visit: Payer: Medicaid Other | Admitting: Orthopaedic Surgery

## 2020-06-09 ENCOUNTER — Other Ambulatory Visit: Payer: Self-pay

## 2020-06-09 ENCOUNTER — Encounter: Payer: Self-pay | Admitting: Orthopaedic Surgery

## 2020-06-09 ENCOUNTER — Ambulatory Visit (INDEPENDENT_AMBULATORY_CARE_PROVIDER_SITE_OTHER): Payer: Medicaid Other | Admitting: Orthopaedic Surgery

## 2020-06-09 ENCOUNTER — Ambulatory Visit: Payer: Self-pay

## 2020-06-09 DIAGNOSIS — M79641 Pain in right hand: Secondary | ICD-10-CM | POA: Diagnosis not present

## 2020-06-09 NOTE — Progress Notes (Signed)
Office Visit Note   Patient: [name]           Date of Birth: 1960-09-14           MRN: [id number] Visit Date: 06/09/2020              Requested by: Gildardo Pounds, NP [place],  [place] [id number] PCP: Gildardo Pounds, NP   Assessment & Plan: Visit Diagnoses:  1. Pain in right hand     Plan: Impression is right hand common wart to the thenar eminence.  At this point, I have referred her to her primary care provider or a dermatologist for further treatment recommendations.  Follow-Up Instructions: Return if symptoms worsen or fail to improve.   Orders:  Orders Placed This Encounter  Procedures  . XR Hand Complete Right   No orders of the defined types were placed in this encounter.     Procedures: No procedures performed   Clinical Data: No additional findings.   Subjective: Chief Complaint  Patient presents with  . Right Hand - Pain    HPI patient is a pleasant 60 year old female who comes in today with concerns about her right hand.  She has noticed what she thinks is a wart to the palmar aspect which began approximately 20 years ago.  No change in size over the past 2 decades.  She notes that this is sore and itches.  She has tried to freeze it herself in the past without success.  She comes in today for further evaluation and treatment recommendation.  Review of Systems as detailed in HPI.  All others reviewed and are negative.   Objective: Vital Signs: LMP 10/25/2012   Physical Exam well-developed well-nourished female no acute distress.  Alert and oriented x3.  Ortho Exam examination of the right hand reveals a pea-sized grainy area of rough tissue to the thenar eminence.  This is grayish/tan in color.  Nontender to touch.  No drainage.  She is neurovascular intact distally.  Specialty Comments:  No specialty comments available.  Imaging: XR Hand Complete Right  Result Date: 06/09/2020 No acute or structural  abnormalities    PMFS History: Patient Active Problem List   Diagnosis Date Noted  . 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 Screening breast examination 12/24/2018  . Breast discharge 12/24/2018  . Schizoaffective disorder, unspecified condition 01/08/2013  . Severe bipolar disorder with psychotic features ([place]) 01/07/2013  . DEPRESSION, RECURRENT 11/04/2009  . ESSENTIAL HYPERTENSION, BENIGN 11/04/2009  . ABDOMINAL PAIN, GENERALIZED 11/04/2009   Past Medical History:  Diagnosis Date  . Anemia 1981   During chilbirth  . Bipolar 1 disorder ([place])   . Depression   . Emphysema lung ([place])   . Hypertension   . Lymphoma ([place])   . Mental disorder   . Schizophrenia ([place])     Family History  Problem Relation Age of Onset  . Breast cancer Maternal Aunt   . Diabetes Maternal Aunt   . Breast cancer Maternal Aunt   . Breast cancer Maternal Aunt   . Diabetes Mother   . Hypertension Mother   . Colon cancer Mother   . Lung cancer Father        smoker  . Diabetes Brother   . Hypertension Maternal Grandmother   . Diabetes Maternal Grandmother   . Breast cancer Cousin   . Throat cancer Maternal Aunt   . Diabetes Son   .  Hypertension Daughter     Past Surgical History:  Procedure Laterality Date  . NECK SURGERY     cyst  . TUBAL LIGATION     Social History   Occupational History  . Occupation: retired  Tobacco Use  . Smoking status: Current Some Day Smoker    Packs/day: 0.75    Years: 42.00    Pack years: 31.50    Types: Cigarettes  . Smokeless tobacco: Former Systems developer    Types: Snuff  . Tobacco comment: pt smoking about 2 cig per day  Vaping Use  . Vaping Use: Never used  Substance and Sexual Activity  . 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 Drug use: Yes    Frequency: 2.0 times per week     Types: Marijuana    Comment: last use of weed three days ago  . Sexual activity: Yes    Birth control/protection: Surgical

## 2020-08-21 ENCOUNTER — Ambulatory Visit: Payer: Medicaid Other | Admitting: Nurse Practitioner

## 2020-09-07 ENCOUNTER — Other Ambulatory Visit: Payer: Self-pay | Admitting: Nurse Practitioner

## 2020-09-07 DIAGNOSIS — J449 Chronic obstructive pulmonary disease, unspecified: Secondary | ICD-10-CM

## 2020-09-07 NOTE — Telephone Encounter (Signed)
Requested medications are due for refill today.  Unknown  Requested medications are on the active medications list.  no  Last refill. 05/22/2020  Future visit scheduled.   yes  Notes to clinic.  Medication not on active medication list.

## 2020-09-20 NOTE — Progress Notes (Signed)
No show

## 2020-09-21 ENCOUNTER — Inpatient Hospital Stay: Payer: Medicaid Other | Attending: Hematology and Oncology | Admitting: Hematology and Oncology

## 2020-09-21 ENCOUNTER — Inpatient Hospital Stay: Payer: Medicaid Other

## 2020-09-21 DIAGNOSIS — C884 Extranodal marginal zone B-cell lymphoma of mucosa-associated lymphoid tissue [MALT-lymphoma]: Secondary | ICD-10-CM

## 2020-10-30 ENCOUNTER — Ambulatory Visit: Payer: Medicaid Other | Attending: Nurse Practitioner | Admitting: Nurse Practitioner

## 2020-10-30 ENCOUNTER — Other Ambulatory Visit: Payer: Self-pay

## 2020-10-30 ENCOUNTER — Encounter: Payer: Self-pay | Admitting: Nurse Practitioner

## 2020-10-30 VITALS — BP 116/72 | HR 87 | Ht 66.0 in | Wt 140.0 lb

## 2020-10-30 DIAGNOSIS — Z801 Family history of malignant neoplasm of trachea, bronchus and lung: Secondary | ICD-10-CM | POA: Insufficient documentation

## 2020-10-30 DIAGNOSIS — R7989 Other specified abnormal findings of blood chemistry: Secondary | ICD-10-CM | POA: Diagnosis not present

## 2020-10-30 DIAGNOSIS — F17200 Nicotine dependence, unspecified, uncomplicated: Secondary | ICD-10-CM

## 2020-10-30 DIAGNOSIS — J449 Chronic obstructive pulmonary disease, unspecified: Secondary | ICD-10-CM

## 2020-10-30 DIAGNOSIS — I10 Essential (primary) hypertension: Secondary | ICD-10-CM | POA: Insufficient documentation

## 2020-10-30 DIAGNOSIS — Z8249 Family history of ischemic heart disease and other diseases of the circulatory system: Secondary | ICD-10-CM | POA: Insufficient documentation

## 2020-10-30 DIAGNOSIS — J439 Emphysema, unspecified: Secondary | ICD-10-CM | POA: Diagnosis not present

## 2020-10-30 DIAGNOSIS — F17210 Nicotine dependence, cigarettes, uncomplicated: Secondary | ICD-10-CM | POA: Insufficient documentation

## 2020-10-30 DIAGNOSIS — Z79899 Other long term (current) drug therapy: Secondary | ICD-10-CM | POA: Insufficient documentation

## 2020-10-30 DIAGNOSIS — Z880 Allergy status to penicillin: Secondary | ICD-10-CM | POA: Diagnosis not present

## 2020-10-30 DIAGNOSIS — E785 Hyperlipidemia, unspecified: Secondary | ICD-10-CM | POA: Diagnosis not present

## 2020-10-30 DIAGNOSIS — F5101 Primary insomnia: Secondary | ICD-10-CM

## 2020-10-30 MED ORDER — TRAZODONE HCL 50 MG PO TABS: 50.0000 mg | ORAL_TABLET | Freq: Every day | ORAL | 3 refills | Status: DC

## 2020-10-30 MED ORDER — NICOTINE 14 MG/24HR TD PT24: 14.0000 mg | MEDICATED_PATCH | Freq: Every day | TRANSDERMAL | 0 refills | Status: DC

## 2020-10-30 MED ORDER — ANORO ELLIPTA 62.5-25 MCG/INH IN AEPB: INHALATION_SPRAY | RESPIRATORY_TRACT | 2 refills | Status: DC

## 2020-10-30 NOTE — Progress Notes (Signed)
Assessment & Plan:  [name] was seen today for hypertension.  Diagnoses and all orders for this visit:  Primary hypertension -     CMP14+EGFR Continue all antihypertensives as prescribed.  Remember to bring in your blood pressure log with you for your follow up appointment.  DASH/Mediterranean Diets are healthier choices for HTN.    COPD mixed type ([place]) -     umeclidinium-vilanterol (ANORO ELLIPTA) 62.5-25 MCG/INH AEPB; TAKE 1 PUFF BY MOUTH EVERY DAY  Dyslipidemia, goal LDL below 100 -     Lipid panel INSTRUCTIONS: Work on a low fat, heart healthy diet and participate in regular aerobic exercise program by working out at least 150 minutes per week; 5 days a week-30 minutes per day. Avoid red meat/beef/steak,  fried foods. junk foods, sodas, sugary drinks, unhealthy snacking, alcohol and smoking.  Drink at least 80 oz of water per day and monitor your carbohydrate intake daily.    Abnormal CBC -     CBC  Tobacco dependence -     nicotine (NICODERM CQ - DOSED IN MG/24 HOURS) 14 mg/24hr patch; Place 1 patch (14 mg total) onto the skin daily. [name] continues to smoke 1 cigarette per day. 2. [name] was counseled on the dangers of tobacco use, and was advised to quit. We reviewed specific strategies to maximize success, including removing cigarettes and smoking materials from environment, stress management and support of family/friends as well as pharmacological alternatives. 3. A total of 4 minutes was spent on counseling for smoking cessation and [name] is ready to quit and has chosen Nicotine patches to start today.  4. [name] was offered Wellbutrin, Chantix, Nicotine patch, Nicotine gum or lozenges.  Due to out of pocket costs [name] was also given smoking cessation support and advised to contact: the Smoking Cessation hotline: 1-800-QUIT-NOW.  [name] was also informed of our Smoking cessation classes which are also available through [facility] by calling  [phone] or visit our website at [email or web address]  5. Will follow up at next scheduled office visit.    Primary insomnia -     traZODone (DESYREL) 50 MG tablet; Take 1-2 tablets (50-100 mg total) by mouth at bedtime.   Patient has been counseled on age-appropriate routine health concerns for screening and prevention. These are reviewed and up-to-date. Referrals have been placed accordingly. Immunizations are up-to-date or declined.    Subjective:   Chief Complaint  Patient presents with   Hypertension   Hypertension Pertinent negatives include no blurred vision, chest pain, headaches, malaise/fatigue, palpitations or shortness of breath.  [name] 60 y.o. female presents to office today for follow up to HTN. She had COVID a few weeks ago. Doing well today.  Denies chest pain, shortness of breath, palpitations, lightheadedness, dizziness, headaches or BLE edema.    Insomnia She has tried melatonin and other OTC sleep aids. She has trouble falling and staying asleep. She does not nap during the day. The most amount of sleep she gets is 3 hours. She does not drink caffeine in the evening or night time.   Tobacco Dependence She has a hard time stopping smoking due to other family members in the home that smoke as well. She only smokes 1 cigarette per day. States the patches work well when she uses them   Essential Hypertension Blood pressure well controlled with amlodipine 5 mg daily.  BP Readings from Last 3 Encounters:  10/30/20 116/72  05/22/20 117/75  03/24/20 (!) 140/91  COPD Symptoms well controlled with anora inhaler.    Review of Systems  Constitutional:  Negative for fever, malaise/fatigue and weight loss.  HENT: Negative.  Negative for nosebleeds.   Eyes: Negative.  Negative for blurred vision, double vision and photophobia.  Respiratory: Negative.  Negative for cough and shortness of breath.   Cardiovascular: Negative.  Negative for chest pain,  palpitations and leg swelling.  Gastrointestinal: Negative.  Negative for heartburn, nausea and vomiting.  Musculoskeletal: Negative.  Negative for myalgias.  Neurological: Negative.  Negative for dizziness, focal weakness, seizures and headaches.  Psychiatric/Behavioral:  Negative for suicidal ideas. The patient has insomnia.    Past Medical History:  Diagnosis Date   Anemia 1981   During chilbirth   Bipolar 1 disorder ([place])    Depression    Emphysema lung ([place])    Hypertension    Lymphoma ([place])    Mental disorder    Schizophrenia ([place])     Past Surgical History:  Procedure Laterality Date   NECK SURGERY     cyst   TUBAL LIGATION      Family History  Problem Relation Age of Onset   Breast cancer Maternal Aunt    Diabetes Maternal Aunt    Breast cancer Maternal Aunt    Breast cancer Maternal Aunt    Diabetes Mother    Hypertension Mother    Colon cancer Mother    Lung cancer Father        smoker   Diabetes Brother    Hypertension Maternal Grandmother    Diabetes Maternal Grandmother    Breast cancer Cousin    Throat cancer Maternal Aunt    Diabetes Son    Hypertension Daughter     Social History Reviewed with no changes to be made today.   Outpatient Medications Prior to Visit  Medication Sig Dispense Refill   amLODipine (NORVASC) 5 MG tablet Take 1 tablet (5 mg total) by mouth daily. 90 tablet 1   ANORO ELLIPTA 62.5-25 MCG/INH AEPB TAKE 1 PUFF BY MOUTH EVERY DAY 60 each 2   Blood Pressure Monitor DEVI Please provide patient with insurance approved blood pressure monitor (Patient not taking: Reported on 10/30/2020) 1 each 0   Multiple Vitamin (MULTIVITAMIN WITH MINERALS) TABS tablet Take 1 tablet by mouth daily. Women's One-A-Day Mulitvitamin (Patient not taking: Reported on 10/30/2020)     No facility-administered medications prior to visit.    Allergies  Allergen Reactions   Penicillins Hives and Swelling    REACTION: swelling of tongue; hives Did it  involve swelling of the face/tongue/throat, SOB, or low BP? Yes Did it involve sudden or severe rash/hives, skin peeling, or any reaction on the inside of your mouth or nose? No Did you need to seek medical attention at a hospital or doctor's office? Yes When did it last happen? ~2014    If all above answers are "NO", may proceed with cephalosporin use.        Objective:    BP 116/72   Pulse 87   Ht $R'5\' 6"'ON$  (1.676 m)   Wt 140 lb (63.5 kg)   LMP 10/25/2012   SpO2 97%   BMI 22.60 kg/m  Wt Readings from Last 3 Encounters:  10/30/20 140 lb (63.5 kg)  05/22/20 150 lb (68 kg)  03/24/20 151 lb 3.2 oz (68.6 kg)    Physical Exam Vitals and nursing note reviewed.  Constitutional:      Appearance: She is well-developed.  HENT:     Head:  Normocephalic and atraumatic.  Cardiovascular:     Rate and Rhythm: Normal rate and regular rhythm.     Heart sounds: Normal heart sounds. No murmur heard.   No friction rub. No gallop.  Pulmonary:     Effort: Pulmonary effort is normal. No tachypnea or respiratory distress.     Breath sounds: Normal breath sounds. No decreased breath sounds, wheezing, rhonchi or rales.  Chest:     Chest wall: No tenderness.  Abdominal:     General: Bowel sounds are normal.     Palpations: Abdomen is soft.  Musculoskeletal:        General: Normal range of motion.     Cervical back: Normal range of motion.  Skin:    General: Skin is warm and dry.  Neurological:     Mental Status: She is alert and oriented to person, place, and time.     Coordination: Coordination normal.  Psychiatric:        Behavior: Behavior normal. Behavior is cooperative.        Thought Content: Thought content normal.        Judgment: Judgment normal.         Patient has been counseled extensively about nutrition and exercise as well as the importance of adherence with medications and regular follow-up. The patient was given clear instructions to go to ER or return to medical center  if symptoms don't improve, worsen or new problems develop. The patient verbalized understanding.   Follow-up: Return for PAP SMEAR.   Gildardo Pounds, FNP-BC [facility] and [place], [place]   10/30/2020, 1:53 PM

## 2020-10-31 LAB — CMP14+EGFR
ALT: 16 IU/L (ref 0–32)
AST: 37 IU/L (ref 0–40)
Albumin/Globulin Ratio: 1.5 (ref 1.2–2.2)
Albumin: 4.8 g/dL (ref 3.8–4.9)
Alkaline Phosphatase: 94 IU/L (ref 44–121)
BUN/Creatinine Ratio: 8 — ABNORMAL LOW (ref 12–28)
BUN: 6 mg/dL — ABNORMAL LOW (ref 8–27)
Bilirubin Total: 0.3 mg/dL (ref 0.0–1.2)
CO2: 20 mmol/L (ref 20–29)
Calcium: 9.5 mg/dL (ref 8.7–10.3)
Chloride: 101 mmol/L (ref 96–106)
Creatinine, Ser: 0.79 mg/dL (ref 0.57–1.00)
Globulin, Total: 3.3 g/dL (ref 1.5–4.5)
Glucose: 79 mg/dL (ref 65–99)
Potassium: 4.8 mmol/L (ref 3.5–5.2)
Sodium: 141 mmol/L (ref 134–144)
Total Protein: 8.1 g/dL (ref 6.0–8.5)
eGFR: 86 mL/min/{1.73_m2} (ref >59)

## 2020-10-31 LAB — CBC
Hematocrit: 37 % (ref 34.0–46.6)
Hemoglobin: 13.1 g/dL (ref 11.1–15.9)
MCH: 35.3 pg — ABNORMAL HIGH (ref 26.6–33.0)
MCHC: 35.4 g/dL (ref 31.5–35.7)
MCV: 100 fL — ABNORMAL HIGH (ref 79–97)
Platelets: 175 10*3/uL (ref 150–450)
RBC: 3.71 x10E6/uL — ABNORMAL LOW (ref 3.77–5.28)
RDW: 13.6 % (ref 11.7–15.4)
WBC: 4.6 10*3/uL (ref 3.4–10.8)

## 2020-10-31 LAB — LIPID PANEL
Chol/HDL Ratio: 1.9 ratio (ref 0.0–4.4)
Cholesterol, Total: 252 mg/dL — ABNORMAL HIGH (ref 100–199)
HDL: 131 mg/dL (ref >39)
LDL Chol Calc (NIH): 104 mg/dL — ABNORMAL HIGH (ref 0–99)
Triglycerides: 107 mg/dL (ref 0–149)
VLDL Cholesterol Cal: 17 mg/dL (ref 5–40)

## 2020-11-21 ENCOUNTER — Other Ambulatory Visit: Payer: Self-pay | Admitting: Nurse Practitioner

## 2020-11-21 DIAGNOSIS — F5101 Primary insomnia: Secondary | ICD-10-CM

## 2020-11-21 NOTE — Telephone Encounter (Signed)
   Notes to clinic:   : REQUEST FOR 90 DAYS PRESCRIPTION  Requested Prescriptions  Pending Prescriptions Disp Refills   traZODone (DESYREL) 50 MG tablet [Pharmacy Med Name: TRAZODONE 50 MG TABLET] 180 tablet 2    Sig: TAKE 1-2 TABLETS BY MOUTH AT BEDTIME.      Psychiatry: Antidepressants - Serotonin Modulator Passed - 11/21/2020  1:35 PM      Passed - Completed PHQ-2 or PHQ-9 in the last 360 days      Passed - Valid encounter within last 6 months    Recent Outpatient Visits           3 weeks ago Primary hypertension   [place], Vernia Buff, NP   6 months ago Annual physical exam   [place], Vernia Buff, NP   1 year ago Essential hypertension   [place], Vernia Buff, NP

## 2020-12-12 ENCOUNTER — Ambulatory Visit: Payer: Medicaid Other | Admitting: Nurse Practitioner

## 2020-12-13 ENCOUNTER — Other Ambulatory Visit: Payer: Self-pay | Admitting: Nurse Practitioner

## 2020-12-13 DIAGNOSIS — Z1231 Encounter for screening mammogram for malignant neoplasm of breast: Secondary | ICD-10-CM

## 2020-12-29 ENCOUNTER — Other Ambulatory Visit: Payer: Self-pay | Admitting: Nurse Practitioner

## 2020-12-29 DIAGNOSIS — I10 Essential (primary) hypertension: Secondary | ICD-10-CM

## 2021-01-30 ENCOUNTER — Ambulatory Visit: Payer: Medicaid Other | Attending: Nurse Practitioner | Admitting: Nurse Practitioner

## 2021-01-30 ENCOUNTER — Other Ambulatory Visit: Payer: Self-pay

## 2021-01-30 ENCOUNTER — Encounter: Payer: Self-pay | Admitting: Nurse Practitioner

## 2021-01-30 ENCOUNTER — Other Ambulatory Visit (HOSPITAL_COMMUNITY)
Admission: RE | Admit: 2021-01-30 | Discharge: 2021-01-30 | Disposition: A | Discharge status: Home / Self Care | Payer: Medicaid Other | Source: Ambulatory Visit | Attending: Nurse Practitioner | Admitting: Nurse Practitioner

## 2021-01-30 VITALS — BP 107/72 | HR 70 | Ht 66.0 in | Wt 148.5 lb

## 2021-01-30 DIAGNOSIS — Z79899 Other long term (current) drug therapy: Secondary | ICD-10-CM | POA: Insufficient documentation

## 2021-01-30 DIAGNOSIS — J439 Emphysema, unspecified: Secondary | ICD-10-CM | POA: Diagnosis not present

## 2021-01-30 DIAGNOSIS — Z124 Encounter for screening for malignant neoplasm of cervix: Secondary | ICD-10-CM | POA: Insufficient documentation

## 2021-01-30 DIAGNOSIS — Z01419 Encounter for gynecological examination (general) (routine) without abnormal findings: Secondary | ICD-10-CM | POA: Insufficient documentation

## 2021-01-30 DIAGNOSIS — G8929 Other chronic pain: Secondary | ICD-10-CM

## 2021-01-30 DIAGNOSIS — I10 Essential (primary) hypertension: Secondary | ICD-10-CM | POA: Diagnosis not present

## 2021-01-30 DIAGNOSIS — D649 Anemia, unspecified: Secondary | ICD-10-CM | POA: Diagnosis not present

## 2021-01-30 DIAGNOSIS — M25512 Pain in left shoulder: Secondary | ICD-10-CM | POA: Diagnosis not present

## 2021-01-30 DIAGNOSIS — B078 Other viral warts: Secondary | ICD-10-CM | POA: Diagnosis not present

## 2021-01-30 DIAGNOSIS — Z8249 Family history of ischemic heart disease and other diseases of the circulatory system: Secondary | ICD-10-CM | POA: Insufficient documentation

## 2021-01-30 DIAGNOSIS — F5101 Primary insomnia: Secondary | ICD-10-CM

## 2021-01-30 DIAGNOSIS — Z114 Encounter for screening for human immunodeficiency virus [HIV]: Secondary | ICD-10-CM | POA: Diagnosis not present

## 2021-01-30 MED ORDER — CYCLOBENZAPRINE HCL 5 MG PO TABS: 5.0000 mg | ORAL_TABLET | Freq: Three times a day (TID) | ORAL | 1 refills | Status: DC | PRN

## 2021-01-30 MED ORDER — AMLODIPINE BESYLATE 5 MG PO TABS: 5.0000 mg | ORAL_TABLET | Freq: Every day | ORAL | 1 refills | Status: DC

## 2021-01-30 MED ORDER — ACETAMINOPHEN-CODEINE #3 300-30 MG PO TABS: 1.0000 | ORAL_TABLET | ORAL | 0 refills | Status: DC | PRN

## 2021-01-30 MED ORDER — TRAZODONE HCL 50 MG PO TABS: 50.0000 mg | ORAL_TABLET | Freq: Every day | ORAL | 3 refills | Status: DC

## 2021-01-30 NOTE — Progress Notes (Addendum)
Assessment & Plan:  [name] was seen today for gynecologic exam.  Diagnoses and all orders for this visit:  Encounter for Papanicolaou smear for cervical cancer screening -     Cytology - PAP -     Cervicovaginal ancillary only  Chronic left shoulder pain -     acetaminophen-codeine (TYLENOL #3) 300-30 MG tablet; Take 1-2 tablets by mouth every 4 (four) hours as needed for moderate pain. -     DG Shoulder Left; Future -     cyclobenzaprine (FLEXERIL) 5 MG tablet; Take 1 tablet (5 mg total) by mouth 3 (three) times daily as needed for muscle spasms.  Palmar wart -     Ambulatory referral to Dermatology  Essential hypertension -     amLODipine (NORVASC) 5 MG tablet; Take 1 tablet (5 mg total) by mouth daily. -     Cancel: Basic metabolic panel  Primary insomnia -     traZODone (DESYREL) 50 MG tablet; Take 1-2 tablets (50-100 mg total) by mouth at bedtime.  Encounter for screening for HIV -     Cancel: HIV antibody (with reflex)   Patient has been counseled on age-appropriate routine health concerns for screening and prevention. These are reviewed and up-to-date. Referrals have been placed accordingly. Immunizations are up-to-date or declined.    Subjective:   Chief Complaint  Patient presents with   Gynecologic Exam    Gynecologic Exam Associated symptoms include joint pain. Pertinent negatives include no fever, headaches, nausea or vomiting.  [name] 60 y.o. female presents to office today for pap smear.  She  has a past medical history of Anemia (1981), Bipolar 1 disorder ([place]), Depression, Emphysema lung ([place]), Hypertension, Lymphoma ([place]), Mental disorder, and Schizophrenia ([place]).   Blood pressure is well controlled.  BP Readings from Last 3 Encounters:  01/30/21 107/72  10/30/20 116/72  05/22/20 117/75     She has chronic left shoulder pain. States she was drinking one night, fell and landed on her left shoulder. She has been unable to raise her arm past  the mid axillary line for the past 2 months.   Palmar wart Needs to see derm for this.   Review of Systems  Constitutional:  Negative for fever, malaise/fatigue and weight loss.  HENT: Negative.  Negative for nosebleeds.   Eyes: Negative.  Negative for blurred vision, double vision and photophobia.  Respiratory: Negative.  Negative for cough and shortness of breath.   Cardiovascular: Negative.  Negative for chest pain, palpitations and leg swelling.  Gastrointestinal: Negative.  Negative for heartburn, nausea and vomiting.  Genitourinary: Negative.   Musculoskeletal:  Positive for joint pain. Negative for myalgias.  Skin:        Right hand palmar wart  Neurological: Negative.  Negative for dizziness, focal weakness, seizures and headaches.  Psychiatric/Behavioral:  Negative for suicidal ideas. The patient has insomnia.    Past Medical History:  Diagnosis Date   Anemia 1981   During chilbirth   Bipolar 1 disorder ([place])    Depression    Emphysema lung ([place])    Hypertension    Lymphoma ([place])    Mental disorder    Schizophrenia ([place])     Past Surgical History:  Procedure Laterality Date   NECK SURGERY     cyst   TUBAL LIGATION      Family History  Problem Relation Age of Onset   Breast cancer Maternal Aunt    Diabetes Maternal Aunt    Breast cancer Maternal  Aunt    Breast cancer Maternal Aunt    Diabetes Mother    Hypertension Mother    Colon cancer Mother    Lung cancer Father        smoker   Diabetes Brother    Hypertension Maternal Grandmother    Diabetes Maternal Grandmother    Breast cancer Cousin    Throat cancer Maternal Aunt    Diabetes Son    Hypertension Daughter     Social History Reviewed with no changes to be made today.   Outpatient Medications Prior to Visit  Medication Sig Dispense Refill   Blood Pressure Monitor DEVI Please provide patient with insurance approved blood pressure monitor 1 each 0   Multiple Vitamin (MULTIVITAMIN WITH  MINERALS) TABS tablet Take 1 tablet by mouth daily. Women's One-A-Day Mulitvitamin     umeclidinium-vilanterol (ANORO ELLIPTA) 62.5-25 MCG/INH AEPB TAKE 1 PUFF BY MOUTH EVERY DAY 60 each 2   amLODipine (NORVASC) 5 MG tablet TAKE 1 TABLET (5 MG TOTAL) BY MOUTH DAILY. 90 tablet 1   traZODone (DESYREL) 50 MG tablet TAKE 1-2 TABLETS BY MOUTH AT BEDTIME. 180 tablet 0   No facility-administered medications prior to visit.    Allergies  Allergen Reactions   Penicillins Hives and Swelling    REACTION: swelling of tongue; hives Did it involve swelling of the face/tongue/throat, SOB, or low BP? Yes Did it involve sudden or severe rash/hives, skin peeling, or any reaction on the inside of your mouth or nose? No Did you need to seek medical attention at a hospital or doctor's office? Yes When did it last happen? ~2014    If all above answers are "NO", may proceed with cephalosporin use.        Objective:    BP 107/72   Pulse 70   Ht 5\' 6"  (1.676 m)   Wt 148 lb 8 oz (67.4 kg)   LMP 10/25/2012   SpO2 99%   BMI 23.97 kg/m  Wt Readings from Last 3 Encounters:  01/30/21 148 lb 8 oz (67.4 kg)  10/30/20 140 lb (63.5 kg)  05/22/20 150 lb (68 kg)    Physical Exam Exam conducted with a chaperone present.  Constitutional:      General: She is not in acute distress.    Appearance: She is well-developed. She is not diaphoretic.  HENT:     Head: Normocephalic and atraumatic.     Right Ear: External ear normal.     Left Ear: External ear normal.     Nose: Nose normal.     Mouth/Throat:     Pharynx: No oropharyngeal exudate.  Eyes:     General: No scleral icterus.       Right eye: No discharge.        Left eye: No discharge.     Conjunctiva/sclera: Conjunctivae normal.     Pupils: Pupils are equal, round, and reactive to light.  Neck:     Thyroid: No thyromegaly.     Trachea: No tracheal deviation.  Cardiovascular:     Rate and Rhythm: Normal rate and regular rhythm.     Heart sounds:  Normal heart sounds. No murmur heard.   No friction rub.  Pulmonary:     Effort: Pulmonary effort is normal. No respiratory distress.     Breath sounds: Normal breath sounds. No decreased breath sounds, wheezing, rhonchi or rales.  Chest:     Chest wall: No tenderness.  Breasts:    Right: No inverted nipple, mass,  nipple discharge, skin change or tenderness.     Left: No inverted nipple, mass, nipple discharge, skin change or tenderness.  Abdominal:     General: Bowel sounds are normal. There is no distension.     Palpations: Abdomen is soft. There is no mass.     Tenderness: There is no abdominal tenderness. There is no guarding or rebound.     Hernia: There is no hernia in the left inguinal area.  Genitourinary:    Exam position: Lithotomy position.     Labia:        Right: No rash, tenderness, lesion or injury.        Left: No rash, tenderness, lesion or injury.      Vagina: Normal. No vaginal discharge, erythema, tenderness or bleeding.     Cervix: No cervical motion tenderness, discharge or friability.     Uterus: Not tender.      Adnexa:        Right: No mass, tenderness or fullness.         Left: No mass, tenderness or fullness.       Rectum: No mass, anal fissure or external hemorrhoid. Normal anal tone.  Musculoskeletal:        General: No deformity.     Left shoulder: Tenderness present. No crepitus. Decreased range of motion. Decreased strength.       Hands:     Cervical back: Normal range of motion and neck supple.  Lymphadenopathy:     Cervical: No cervical adenopathy.  Skin:    General: Skin is warm and dry.     Coloration: Skin is not pale.     Findings: No erythema or rash.  Neurological:     Mental Status: She is alert and oriented to person, place, and time.     Cranial Nerves: No cranial nerve deficit.     Coordination: Coordination normal.  Psychiatric:        Behavior: Behavior normal.        Thought Content: Thought content normal.        Judgment:  Judgment normal.         Patient has been counseled extensively about nutrition and exercise as well as the importance of adherence with medications and regular follow-up. The patient was given clear instructions to go to ER or return to medical center if symptoms don't improve, worsen or new problems develop. The patient verbalized understanding.   Follow-up: Return in about 3 months (around 05/01/2021).   Gildardo Pounds, FNP-BC [facility] and [place], [place]   01/30/2021, 2:27 PM

## 2021-01-31 LAB — CERVICOVAGINAL ANCILLARY ONLY
Bacterial Vaginitis (gardnerella): POSITIVE — AB
Candida Glabrata: NEGATIVE
Candida Vaginitis: NEGATIVE
Chlamydia: NEGATIVE
Comment: NEGATIVE
Comment: NEGATIVE
Comment: NEGATIVE
Comment: NEGATIVE
Comment: NEGATIVE
Comment: NORMAL
Neisseria Gonorrhea: NEGATIVE
Trichomonas: POSITIVE — AB

## 2021-02-01 ENCOUNTER — Encounter (HOSPITAL_COMMUNITY): Payer: Self-pay | Admitting: Emergency Medicine

## 2021-02-01 ENCOUNTER — Ambulatory Visit
Admission: RE | Admit: 2021-02-01 | Discharge: 2021-02-01 | Disposition: A | Discharge status: Home / Self Care | Payer: Medicaid Other | Source: Ambulatory Visit | Attending: Nurse Practitioner | Admitting: Nurse Practitioner

## 2021-02-01 ENCOUNTER — Emergency Department (HOSPITAL_COMMUNITY): Payer: Medicaid Other

## 2021-02-01 ENCOUNTER — Emergency Department (HOSPITAL_COMMUNITY)
Admission: EM | Admit: 2021-02-01 | Discharge: 2021-02-01 | Disposition: A | Discharge status: Home / Self Care | Payer: Medicaid Other | Attending: Emergency Medicine | Admitting: Emergency Medicine

## 2021-02-01 ENCOUNTER — Other Ambulatory Visit: Payer: Self-pay

## 2021-02-01 DIAGNOSIS — F17210 Nicotine dependence, cigarettes, uncomplicated: Secondary | ICD-10-CM | POA: Diagnosis not present

## 2021-02-01 DIAGNOSIS — I10 Essential (primary) hypertension: Secondary | ICD-10-CM | POA: Insufficient documentation

## 2021-02-01 DIAGNOSIS — Z79899 Other long term (current) drug therapy: Secondary | ICD-10-CM | POA: Diagnosis not present

## 2021-02-01 DIAGNOSIS — Z1231 Encounter for screening mammogram for malignant neoplasm of breast: Secondary | ICD-10-CM

## 2021-02-01 DIAGNOSIS — S4992XA Unspecified injury of left shoulder and upper arm, initial encounter: Secondary | ICD-10-CM | POA: Diagnosis present

## 2021-02-01 DIAGNOSIS — W1830XA Fall on same level, unspecified, initial encounter: Secondary | ICD-10-CM | POA: Diagnosis not present

## 2021-02-01 NOTE — ED Provider Notes (Signed)
[facility] EMERGENCY DEPARTMENT Provider Note   CSN: [id number] Arrival date & time: 02/01/21  9201     History No chief complaint on file.   [name] is a 60 y.o. female.  The history is provided by the patient. No language interpreter was used.    60 year old female with hx of bipolar, schizophrenia presenting c/o L shoulder pain.  Pt report 2 months ago she fell and landed on her L shoulder.  Since then she has been having recurrent pain.  Pain is sharp, throbbing, worse when she tries to raise her arm and mildly improves with OTC pain meds.  She denies numbness, she is RHD.  Denies cp, sob, or neck pain.  Was seen by her PCP a few days ago for it and was given tylenol#3 for pain and per pt she needs an xray of her L shoulder.  However, pt is here requesting for xray to be done.    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   Screening breast examination 12/24/2018   Breast discharge 12/24/2018   Schizoaffective disorder, unspecified condition 01/08/2013   Severe bipolar disorder with psychotic features ([place]) 01/07/2013   DEPRESSION, RECURRENT 11/04/2009   ESSENTIAL HYPERTENSION, BENIGN 11/04/2009   ABDOMINAL PAIN, GENERALIZED 11/04/2009    Past Surgical History:  Procedure Laterality Date   NECK SURGERY     cyst   TUBAL LIGATION       OB History     Gravida  3   Para      Term      Preterm      AB      Living  3      SAB      IAB      Ectopic      Multiple      Live Births  3           Family History  Problem Relation Age of Onset   Breast cancer Maternal Aunt    Diabetes  Maternal Aunt    Breast cancer Maternal Aunt    Breast cancer Maternal Aunt    Diabetes Mother    Hypertension Mother    Colon cancer Mother    Lung cancer Father        smoker   Diabetes Brother    Hypertension Maternal Grandmother    Diabetes Maternal Grandmother    Breast cancer Cousin    Throat cancer Maternal Aunt    Diabetes Son    Hypertension Daughter     Social History   Tobacco Use   Smoking status: Some Days    Packs/day: 0.75    Years: 42.00    Pack years: 31.50    Types: Cigarettes   Smokeless tobacco: Former    Types: Snuff   Tobacco comments:    pt smoking about 2 cig per day  Vaping Use   Vaping Use: Never used  Substance Use Topics   Alcohol use: Not Currently    Alcohol/week: 4.0 standard drinks    Types: 4 Cans of beer per  week    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 Authorizing Provider  acetaminophen-codeine (TYLENOL #3) 300-30 MG tablet Take 1-2 tablets by mouth every 4 (four) hours as needed for moderate pain. 01/30/21   Gildardo Pounds, NP  amLODipine (NORVASC) 5 MG tablet Take 1 tablet (5 mg total) by mouth daily. 01/30/21   Gildardo Pounds, NP  Blood Pressure Monitor DEVI Please provide patient with insurance approved blood pressure monitor 04/20/19   Gildardo Pounds, NP  cyclobenzaprine (FLEXERIL) 5 MG tablet Take 1 tablet (5 mg total) by mouth 3 (three) times daily as needed for muscle spasms. 01/30/21   Gildardo Pounds, NP  Multiple Vitamin (MULTIVITAMIN WITH MINERALS) TABS tablet Take 1 tablet by mouth daily. Women's One-A-Day Mulitvitamin    [provider]  traZODone (DESYREL) 50 MG tablet Take 1-2 tablets (50-100 mg total) by mouth at bedtime. 01/30/21   Gildardo Pounds, NP  umeclidinium-vilanterol [facility] ELLIPTA) 62.5-25  MCG/INH AEPB TAKE 1 PUFF BY MOUTH EVERY DAY 10/30/20   Gildardo Pounds, NP    Allergies    Penicillins  Review of Systems   Review of Systems  Constitutional:  Negative for fever.  Musculoskeletal:  Positive for arthralgias.  Skin:  Negative for wound.  Neurological:  Negative for numbness.   Physical Exam Updated Vital Signs BP (!) 137/91   Pulse 60   Temp 97.6 F (36.4 C) (Oral)   Resp 16   LMP 10/25/2012   SpO2 100%   Physical Exam Vitals and nursing note reviewed.  Constitutional:      General: She is not in acute distress.    Appearance: She is well-developed.  HENT:     Head: Atraumatic.  Eyes:     Conjunctiva/sclera: Conjunctivae normal.  Neck:     Comments: No cervical spine tenderness Pulmonary:     Effort: Pulmonary effort is normal.  Musculoskeletal:        General: Tenderness (L shoulder: tenderness about the lateral deltoid and bicipital groove.  decrease shoulder abduction and shoulder raise both with active and passive movement.  no deformity.  sensation intact.  clavicle nontender) present.     Cervical back: Neck supple. No tenderness.     Comments: L elbow nontender  Skin:    Findings: No rash.  Neurological:     Mental Status: She is alert.  Psychiatric:        Mood and Affect: Mood normal.    ED Results / Procedures / Treatments   Labs (all labs ordered are listed, but only abnormal results are displayed) Labs Reviewed - No data to display  EKG None  Radiology DG Shoulder Left  Result Date: 02/01/2021 CLINICAL DATA:  Fall, injury EXAM: LEFT SHOULDER - 2+ VIEW COMPARISON:  None. FINDINGS: There is no acute fracture or dislocation. Glenohumeral and acromioclavicular alignment is normal. The joint spaces are preserved. The soft tissues are unremarkable. IMPRESSION: Unremarkable shoulder radiographs. Electronically Signed   By: Valetta Mole M.D.   On: 02/01/2021 09:58    Procedures Procedures   Medications Ordered in ED Medications - No  data to display  ED Course  I have reviewed the triage vital signs and the nursing notes.  Pertinent labs &  imaging results that were available during my care of the patient were reviewed by me and considered in my medical decision making (see chart for details).    MDM Rules/Calculators/A&P                           BP (!) 137/91   Pulse 60   Temp 97.6 F (36.4 C) (Oral)   Resp 16   LMP 10/25/2012   SpO2 100%   Final Clinical Impression(s) / ED Diagnoses Final diagnoses:  Injury of left shoulder, initial encounter    Rx / DC Orders ED Discharge Orders     None      9:12 AM Pt fell and injured her L shoulder 2 months ago.  She is here requesting for an xray. Xray ordered.  Pt is NVI.  No other injury noted.    10:08 AM Xray of L shoulder without acute abnormality.  I recommend pt to f/u with orthopedist as she may benefit from further advance imaging as deem appropriate.    Domenic Moras, PA-C 02/01/21 1036    Dorie Rank, MD 02/04/21 [id number]

## 2021-02-01 NOTE — ED Triage Notes (Signed)
Pt coming from home complaint of fall 2 months ago, persistent left shoulder pain since, VSS.

## 2021-02-01 NOTE — ED Notes (Signed)
Delayed triage. Py using restroom.

## 2021-02-01 NOTE — Discharge Instructions (Signed)
Please call and follow up with an orthopedist specialist for further management of your left shoulder injury.  Your xray today did not show any broken bone or dislocation

## 2021-02-01 NOTE — ED Notes (Signed)
Pt discharge instructions reviewed with the patient. The patient verbalized understanding of instructions. Patient discharged.

## 2021-02-04 ENCOUNTER — Other Ambulatory Visit: Payer: Self-pay | Admitting: Nurse Practitioner

## 2021-02-04 MED ORDER — METRONIDAZOLE 500 MG PO TABS: 500.0000 mg | ORAL_TABLET | Freq: Two times a day (BID) | ORAL | 0 refills | Status: AC

## 2021-02-07 LAB — CYTOLOGY - PAP
Comment: NEGATIVE
Diagnosis: NEGATIVE
Diagnosis: REACTIVE
High risk HPV: NEGATIVE

## 2021-06-07 ENCOUNTER — Other Ambulatory Visit: Payer: Self-pay | Admitting: Nurse Practitioner

## 2021-06-07 DIAGNOSIS — F17200 Nicotine dependence, unspecified, uncomplicated: Secondary | ICD-10-CM

## 2021-06-07 NOTE — Telephone Encounter (Signed)
Requested medications are due for refill today.  unsure  Requested medications are on the active medications list.  no  Last refill. unknown  Future visit scheduled.   yes  Notes to clinic.  Medication not on med list.    Requested Prescriptions  Pending Prescriptions Disp Refills   nicotine (NICODERM CQ - DOSED IN MG/24 HOURS) 14 mg/24hr patch [Pharmacy Med Name: NICOTINE 14 MG/24HR PATCH] 42 patch 0    Sig: PLACE 1 PATCH ONTO THE SKIN DAILY.     Psychiatry:  Drug Dependence Therapy Passed - 06/07/2021  4:10 PM      Passed - Valid encounter within last 12 months    Recent Outpatient Visits           4 months ago Encounter for Papanicolaou smear for cervical cancer screening   [place], NP   7 months ago Primary hypertension   [place], Vernia Buff, NP   1 year ago Annual physical exam   [place], Vernia Buff, NP   2 years ago Essential hypertension   [place], Vernia Buff, NP       Future Appointments             In 2 months Gildardo Pounds, NP [place]

## 2021-07-03 IMAGING — US US BREAST*R* LIMITED INC AXILLA
1 series · 4 of 4 positions shown · non-contrast
Comparison: Previous exam(s).

CLINICAL DATA: Clear right nipple discharge for the past 3 months,
only when expressed. 2 maternal aunts with a history of breast
cancer.

EXAM:
DIGITAL DIAGNOSTIC BILATERAL MAMMOGRAM WITH CAD AND TOMO
ULTRASOUND RIGHT BREAST

[Series 1: us breast*right* limited inc axilla · 0.06mm/px · 4 of 4 slices shown]
[im 1/4]
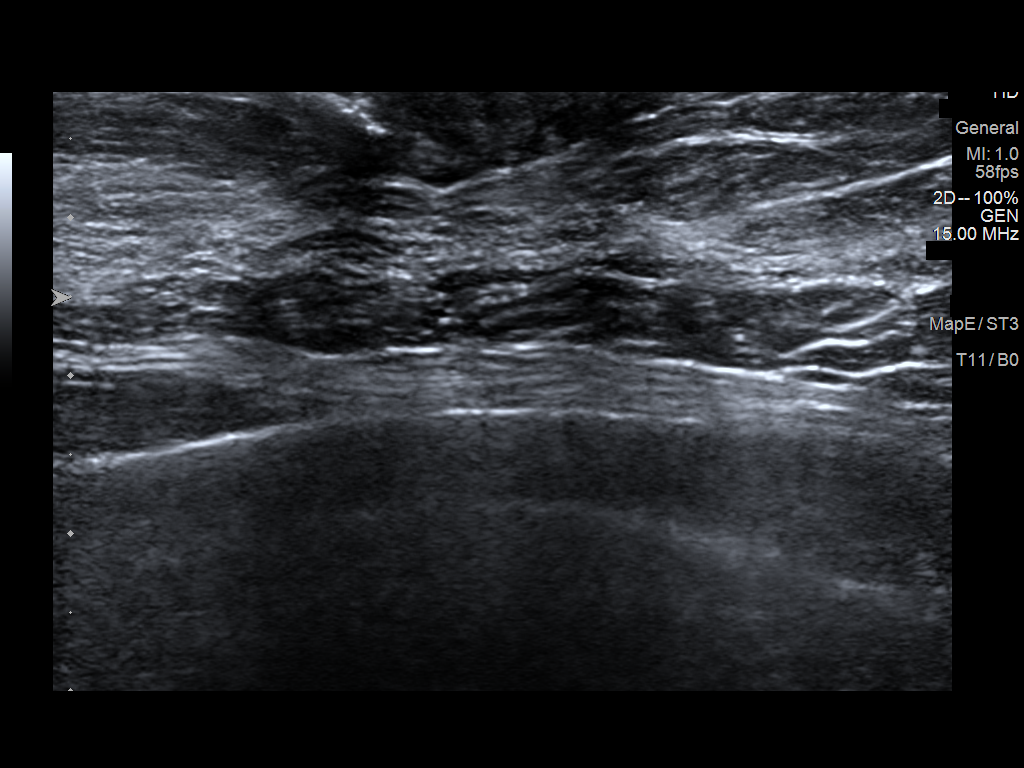
[im 2/4]
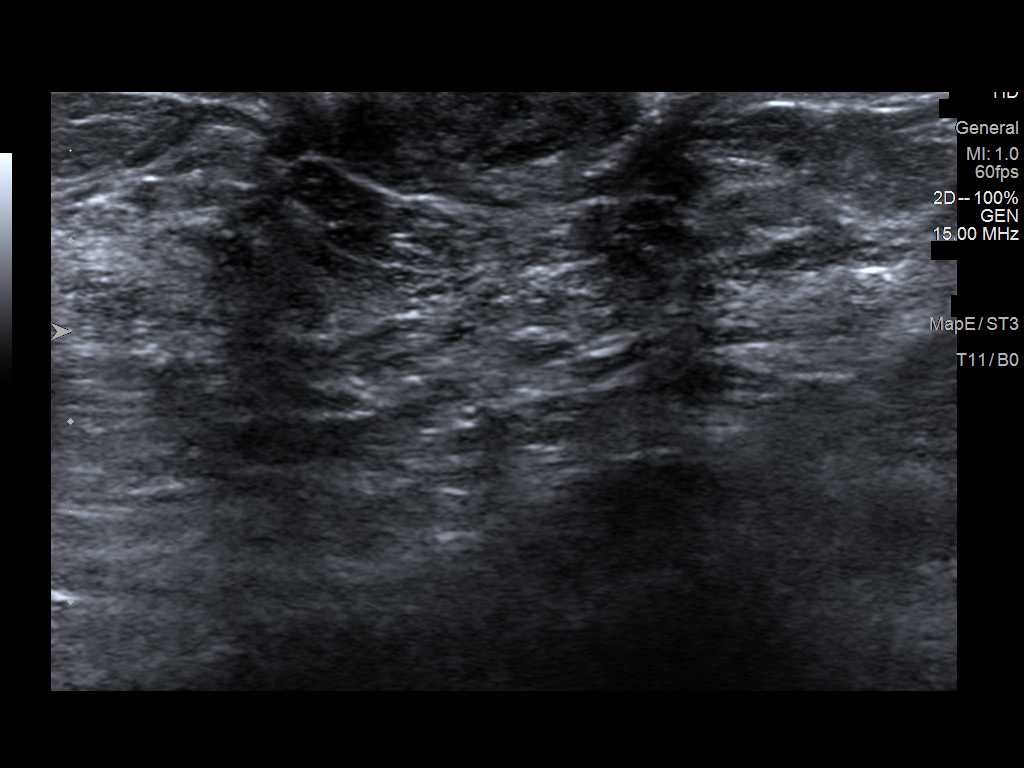
[im 3/4]
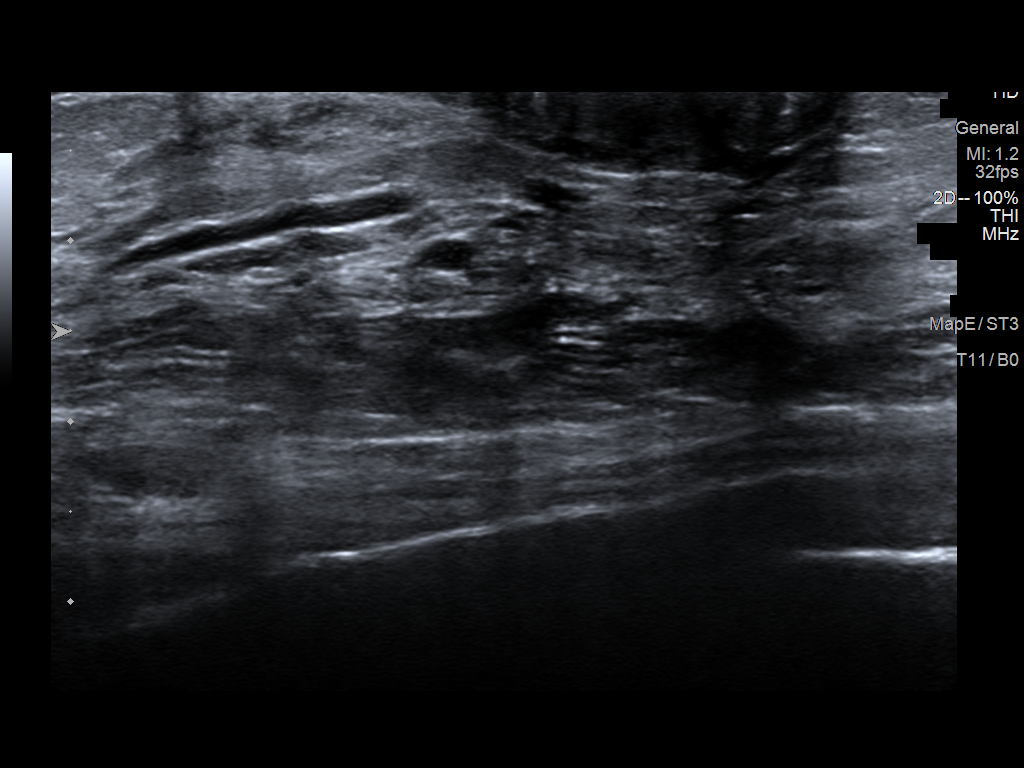
[im 4/4]
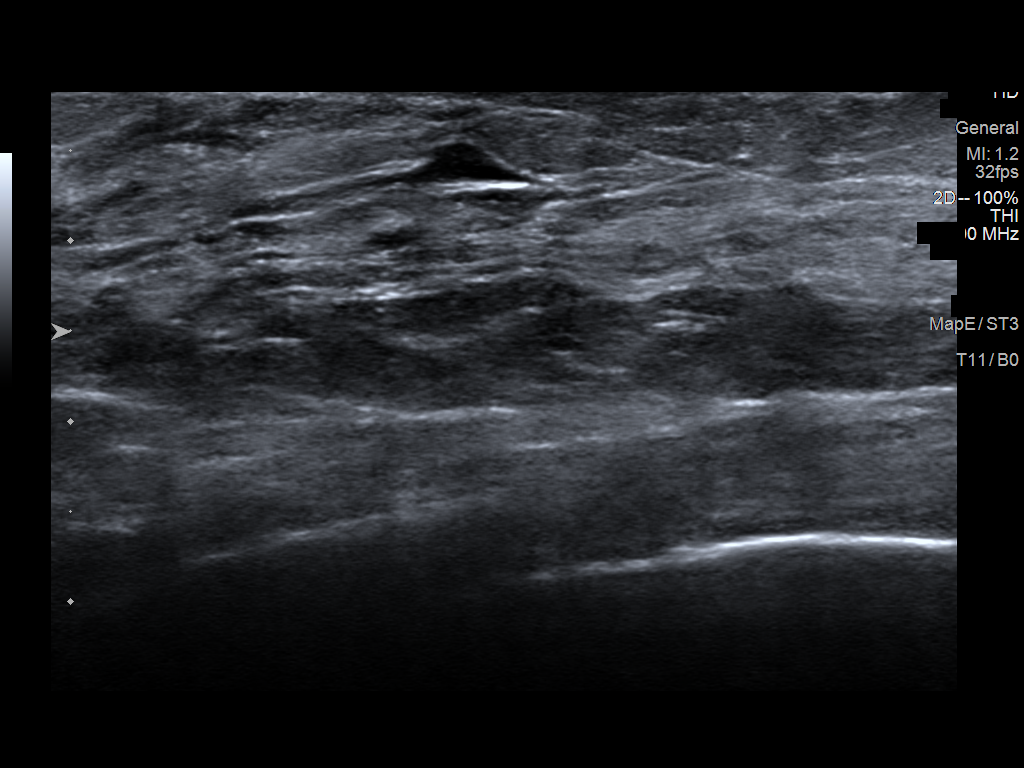

[4 of 4 positions shown; findings below may reference images not displayed]

ACR Breast Density Category b: There are scattered areas of
fibroglandular density.
FINDINGS: Stable mammographic appearance of the breasts with no findings
suspicious for malignancy in either breast.

Mammographic images were processed with CAD.

On physical exam, no mass palpable in the retroareolar or
periareolar region of the right breast. No nipple discharge could be
elicited.

Targeted ultrasound is performed, showing mild retroareolar
periareolar duct ectasia the right. Real time, there was some debris
ducts with no intraductal masses identified.
IMPRESSION: Mild right breast duct ectasia. No evidence of malignancy in either
breast.

RECOMMENDATION:
1. Bilateral screening mammogram in 1 year.
2. The patient was instructed to stop squeezing the right breast and
to return if she develops spontaneous nipple discharge.

I have discussed the findings and recommendations with the patient.
Results were also provided in writing at the conclusion of the
visit. If applicable, a reminder letter will be sent to the patient
regarding the next appointment.

BI-RADS CATEGORY  2: Benign.

## 2021-08-06 ENCOUNTER — Encounter: Payer: Medicaid Other | Admitting: Nurse Practitioner

## 2021-09-17 ENCOUNTER — Other Ambulatory Visit: Payer: Self-pay | Admitting: Nurse Practitioner

## 2021-09-17 DIAGNOSIS — I10 Essential (primary) hypertension: Secondary | ICD-10-CM

## 2021-09-17 NOTE — Telephone Encounter (Signed)
Medication Refill - Medication: umeclidinium-vilanterol (ANORO ELLIPTA) 62.5-25 MCG/INH AEPB ? ?Has the patient contacted their pharmacy? Yes.   ?(Agent: If no, request that the patient contact the pharmacy for the refill. If patient does not wish to contact the pharmacy document the reason why and proceed with request.) ?(Agent: If yes, when and what did the pharmacy advise?) ? ?Preferred Pharmacy (with phone number or street name):  ?[id number] [address] ?[id number] ?Phone: [phone]: [phone] ? ?Has the patient been seen for an appointment in the last year OR does the patient have an upcoming appointment? Yes.   ? ?Agent: Please be advised that RX refills may take up to 3 business days. We ask that you follow-up with your pharmacy. ? ?

## 2021-09-18 NOTE — Telephone Encounter (Signed)
Appointment 10/17/21- courtesy RF given ?Requested Prescriptions  ?Pending Prescriptions Disp Refills  ?? amLODipine (NORVASC) 5 MG tablet [Pharmacy Med Name: AMLODIPINE BESYLATE 5 MG TAB] 90 tablet 1  ?  Sig: TAKE 1 TABLET (5 MG TOTAL) BY MOUTH DAILY.  ?  ? Cardiovascular: Calcium Channel Blockers 2 Failed - 09/17/2021 12:08 PM  ?  ?  Failed - Last BP in normal range  ?  BP Readings from Last 1 Encounters:  ?02/01/21 (!) 137/91  ?   ?  ?  Failed - Valid encounter within last 6 months  ?  Recent Outpatient Visits   ?      ? 7 months ago Encounter for Papanicolaou smear for cervical cancer screening  ? [place], [place] W, NP  ? 10 months ago Primary hypertension  ? [place] Gildardo Pounds, NP  ? 1 year ago Annual physical exam  ? [place], Vernia Buff, NP  ? 2 years ago Essential hypertension  ? [place] Gildardo Pounds, NP  ?  ?  ?Future Appointments   ?        ? In 4 weeks Gildardo Pounds, NP [place]  ?  ? ?  ?  ?  Passed - Last Heart Rate in normal range  ?  Pulse Readings from Last 1 Encounters:  ?02/01/21 60  ?   ?  ?  ? ?

## 2021-10-17 ENCOUNTER — Encounter: Payer: Self-pay | Admitting: Nurse Practitioner

## 2021-10-17 ENCOUNTER — Ambulatory Visit: Payer: Medicaid Other | Attending: Nurse Practitioner | Admitting: Nurse Practitioner

## 2021-10-17 VITALS — BP 120/81 | HR 60 | Wt 149.8 lb

## 2021-10-17 DIAGNOSIS — M62838 Other muscle spasm: Secondary | ICD-10-CM | POA: Insufficient documentation

## 2021-10-17 DIAGNOSIS — M25512 Pain in left shoulder: Secondary | ICD-10-CM

## 2021-10-17 DIAGNOSIS — Z7951 Long term (current) use of inhaled steroids: Secondary | ICD-10-CM | POA: Diagnosis not present

## 2021-10-17 DIAGNOSIS — J439 Emphysema, unspecified: Secondary | ICD-10-CM | POA: Insufficient documentation

## 2021-10-17 DIAGNOSIS — Z7952 Long term (current) use of systemic steroids: Secondary | ICD-10-CM | POA: Diagnosis not present

## 2021-10-17 DIAGNOSIS — E785 Hyperlipidemia, unspecified: Secondary | ICD-10-CM

## 2021-10-17 DIAGNOSIS — J449 Chronic obstructive pulmonary disease, unspecified: Secondary | ICD-10-CM | POA: Diagnosis not present

## 2021-10-17 DIAGNOSIS — F319 Bipolar disorder, unspecified: Secondary | ICD-10-CM | POA: Diagnosis not present

## 2021-10-17 DIAGNOSIS — D649 Anemia, unspecified: Secondary | ICD-10-CM | POA: Diagnosis not present

## 2021-10-17 DIAGNOSIS — G8929 Other chronic pain: Secondary | ICD-10-CM

## 2021-10-17 DIAGNOSIS — F209 Schizophrenia, unspecified: Secondary | ICD-10-CM | POA: Insufficient documentation

## 2021-10-17 DIAGNOSIS — Z136 Encounter for screening for cardiovascular disorders: Secondary | ICD-10-CM | POA: Insufficient documentation

## 2021-10-17 DIAGNOSIS — R7989 Other specified abnormal findings of blood chemistry: Secondary | ICD-10-CM

## 2021-10-17 DIAGNOSIS — I10 Essential (primary) hypertension: Secondary | ICD-10-CM

## 2021-10-17 DIAGNOSIS — Z8659 Personal history of other mental and behavioral disorders: Secondary | ICD-10-CM | POA: Diagnosis not present

## 2021-10-17 DIAGNOSIS — Z8572 Personal history of non-Hodgkin lymphomas: Secondary | ICD-10-CM | POA: Diagnosis not present

## 2021-10-17 DIAGNOSIS — Z833 Family history of diabetes mellitus: Secondary | ICD-10-CM | POA: Diagnosis not present

## 2021-10-17 DIAGNOSIS — Z79899 Other long term (current) drug therapy: Secondary | ICD-10-CM | POA: Insufficient documentation

## 2021-10-17 MED ORDER — AMLODIPINE BESYLATE 5 MG PO TABS: 5.0000 mg | ORAL_TABLET | Freq: Every day | ORAL | 0 refills | Status: DC

## 2021-10-17 MED ORDER — CYCLOBENZAPRINE HCL 5 MG PO TABS: 5.0000 mg | ORAL_TABLET | Freq: Three times a day (TID) | ORAL | 1 refills | Status: DC | PRN

## 2021-10-17 MED ORDER — UMECLIDINIUM-VILANTEROL 62.5-25 MCG/ACT IN AEPB: 1.0000 | INHALATION_SPRAY | Freq: Every day | RESPIRATORY_TRACT | 6 refills | Status: DC

## 2021-10-17 MED ORDER — ALBUTEROL SULFATE HFA 108 (90 BASE) MCG/ACT IN AERS: 2.0000 | INHALATION_SPRAY | Freq: Four times a day (QID) | RESPIRATORY_TRACT | 2 refills | Status: DC | PRN

## 2021-10-17 NOTE — Progress Notes (Signed)
Assessment & Plan:  [name] was seen today for hypertension.  Diagnoses and all orders for this visit:  Essential hypertension -     CMP14+EGFR -     amLODipine (NORVASC) 5 MG tablet; Take 1 tablet (5 mg total) by mouth daily.  COPD mixed type ([place]) -     umeclidinium-vilanterol (ANORO ELLIPTA) 62.5-25 MCG/ACT AEPB; Inhale 1 puff into the lungs daily. -     albuterol (VENTOLIN HFA) 108 (90 Base) MCG/ACT inhaler; Inhale 2 puffs into the lungs every 6 (six) hours as needed for wheezing or shortness of breath.  Dyslipidemia, goal LDL below 100 -     Lipid panel  Abnormal CBC -     CBC with Differential  Chronic left shoulder pain -     cyclobenzaprine (FLEXERIL) 5 MG tablet; Take 1 tablet (5 mg total) by mouth 3 (three) times daily as needed for muscle spasms.  Family history of diabetes mellitus (DM) -     Hemoglobin A1c     Patient has been counseled on age-appropriate routine health concerns for screening and prevention. These are reviewed and up-to-date. Referrals have been placed accordingly. Immunizations are up-to-date or declined.    Subjective:   Chief Complaint  Patient presents with   Hypertension    [name] 61 y.o. female presents to office today for follow-up to hypertension. She has a past medical history of Anemia (1981), Bipolar 1 disorder, Depression, Emphysema lung, Hypertension, Lymphoma, Mental disorder, and Schizophrenia     HTN Blood pressure is well controlled today with amlodipine 5 mg daily.  She has a blood pressure monitor but does not check her blood pressure often. BP Readings from Last 3 Encounters:  10/17/21 120/81  02/01/21 (!) 137/91  01/30/21 107/72    COPD Denies any recent COPD exacerbations.  Taking Anoro Ellipta daily as prescribed    Review of Systems  Constitutional:  Negative for fever, malaise/fatigue and weight loss.  HENT: Negative.  Negative for nosebleeds.   Eyes: Negative.  Negative for blurred vision, double  vision and photophobia.  Respiratory: Negative.  Negative for cough and shortness of breath.   Cardiovascular: Negative.  Negative for chest pain, palpitations and leg swelling.  Gastrointestinal: Negative.  Negative for heartburn, nausea and vomiting.  Musculoskeletal:  Positive for joint pain. Negative for myalgias.  Neurological: Negative.  Negative for dizziness, focal weakness, seizures and headaches.  Psychiatric/Behavioral: Negative.  Negative for suicidal ideas.    Past Medical History:  Diagnosis Date   Anemia 1981   During chilbirth   Bipolar 1 disorder ([place])    Depression    Emphysema lung ([place])    Hypertension    Lymphoma ([place])    Mental disorder    Schizophrenia ([place])     Past Surgical History:  Procedure Laterality Date   NECK SURGERY     cyst   TUBAL LIGATION      Family History  Problem Relation Age of Onset   Breast cancer Maternal Aunt    Diabetes Maternal Aunt    Breast cancer Maternal Aunt    Breast cancer Maternal Aunt    Diabetes Mother    Hypertension Mother    Colon cancer Mother    Lung cancer Father        smoker   Diabetes Brother    Hypertension Maternal Grandmother    Diabetes Maternal Grandmother    Breast cancer Cousin    Throat cancer Maternal Aunt    Diabetes Son  Hypertension Daughter     Social History Reviewed with no changes to be made today.   Outpatient Medications Prior to Visit  Medication Sig Dispense Refill   acetaminophen-codeine (TYLENOL #3) 300-30 MG tablet Take 1-2 tablets by mouth every 4 (four) hours as needed for moderate pain. 30 tablet 0   Blood Pressure Monitor DEVI Please provide patient with insurance approved blood pressure monitor 1 each 0   Multiple Vitamin (MULTIVITAMIN WITH MINERALS) TABS tablet Take 1 tablet by mouth daily. Women's One-A-Day Mulitvitamin     nicotine (NICODERM CQ - DOSED IN MG/24 HOURS) 14 mg/24hr patch PLACE 1 PATCH ONTO THE SKIN DAILY. 42 patch 0   traZODone (DESYREL) 50 MG tablet  Take 1-2 tablets (50-100 mg total) by mouth at bedtime. 180 tablet 3   amLODipine (NORVASC) 5 MG tablet TAKE 1 TABLET (5 MG TOTAL) BY MOUTH DAILY. 90 tablet 0   cyclobenzaprine (FLEXERIL) 5 MG tablet Take 1 tablet (5 mg total) by mouth 3 (three) times daily as needed for muscle spasms. 30 tablet 1   umeclidinium-vilanterol (ANORO ELLIPTA) 62.5-25 MCG/INH AEPB TAKE 1 PUFF BY MOUTH EVERY DAY 60 each 2   No facility-administered medications prior to visit.    Allergies  Allergen Reactions   Penicillins Hives and Swelling    REACTION: swelling of tongue; hives Did it involve swelling of the face/tongue/throat, SOB, or low BP? Yes Did it involve sudden or severe rash/hives, skin peeling, or any reaction on the inside of your mouth or nose? No Did you need to seek medical attention at a hospital or doctor's office? Yes When did it last happen? ~2014    If all above answers are "NO", may proceed with cephalosporin use.        Objective:    BP 120/81   Pulse 60   Wt 149 lb 12.8 oz (67.9 kg)   LMP 10/25/2012   SpO2 99%   BMI 24.18 kg/m  Wt Readings from Last 3 Encounters:  10/17/21 149 lb 12.8 oz (67.9 kg)  01/30/21 148 lb 8 oz (67.4 kg)  10/30/20 140 lb (63.5 kg)    Physical Exam Vitals and nursing note reviewed.  Constitutional:      Appearance: She is well-developed.  HENT:     Head: Normocephalic and atraumatic.  Cardiovascular:     Rate and Rhythm: Normal rate and regular rhythm.     Heart sounds: Normal heart sounds. No murmur heard.   No friction rub. No gallop.  Pulmonary:     Effort: Pulmonary effort is normal. No tachypnea or respiratory distress.     Breath sounds: Normal breath sounds. No decreased breath sounds, wheezing, rhonchi or rales.  Chest:     Chest wall: No tenderness.  Abdominal:     General: Bowel sounds are normal.     Palpations: Abdomen is soft.  Musculoskeletal:        General: Normal range of motion.     Cervical back: Normal range of  motion.  Skin:    General: Skin is warm and dry.  Neurological:     Mental Status: She is alert and oriented to person, place, and time.     Coordination: Coordination normal.  Psychiatric:        Behavior: Behavior normal. Behavior is cooperative.        Thought Content: Thought content normal.        Judgment: Judgment normal.         Patient has been counseled extensively  about nutrition and exercise as well as the importance of adherence with medications and regular follow-up. The patient was given clear instructions to go to ER or return to medical center if symptoms don't improve, worsen or new problems develop. The patient verbalized understanding.   Follow-up: Return in about 3 months (around 01/17/2022).   Gildardo Pounds, FNP-BC [facility] and [place], [place]   10/17/2021, 1:57 PM

## 2021-10-18 LAB — CBC WITH DIFFERENTIAL/PLATELET
Basophils Absolute: 0 10*3/uL (ref 0.0–0.2)
Basos: 0 %
EOS (ABSOLUTE): 0.1 10*3/uL (ref 0.0–0.4)
Eos: 1 %
Hematocrit: 35.7 % (ref 34.0–46.6)
Hemoglobin: 12 g/dL (ref 11.1–15.9)
Immature Grans (Abs): 0 10*3/uL (ref 0.0–0.1)
Immature Granulocytes: 0 %
Lymphocytes Absolute: 1.7 10*3/uL (ref 0.7–3.1)
Lymphs: 31 %
MCH: 33.4 pg — ABNORMAL HIGH (ref 26.6–33.0)
MCHC: 33.6 g/dL (ref 31.5–35.7)
MCV: 99 fL — ABNORMAL HIGH (ref 79–97)
Monocytes Absolute: 0.4 10*3/uL (ref 0.1–0.9)
Monocytes: 7 %
Neutrophils Absolute: 3.5 10*3/uL (ref 1.4–7.0)
Neutrophils: 61 %
Platelets: 232 10*3/uL (ref 150–450)
RBC: 3.59 x10E6/uL — ABNORMAL LOW (ref 3.77–5.28)
RDW: 12.5 % (ref 11.7–15.4)
WBC: 5.7 10*3/uL (ref 3.4–10.8)

## 2021-10-18 LAB — CMP14+EGFR
ALT: 6 IU/L (ref 0–32)
AST: 15 IU/L (ref 0–40)
Albumin/Globulin Ratio: 1.6 (ref 1.2–2.2)
Albumin: 4.4 g/dL (ref 3.8–4.8)
Alkaline Phosphatase: 67 IU/L (ref 44–121)
BUN/Creatinine Ratio: 9 — ABNORMAL LOW (ref 12–28)
BUN: 6 mg/dL — ABNORMAL LOW (ref 8–27)
Bilirubin Total: <0.2 mg/dL (ref 0.0–1.2)
CO2: 25 mmol/L (ref 20–29)
Calcium: 9.9 mg/dL (ref 8.7–10.3)
Chloride: 105 mmol/L (ref 96–106)
Creatinine, Ser: 0.7 mg/dL (ref 0.57–1.00)
Globulin, Total: 2.7 g/dL (ref 1.5–4.5)
Glucose: 59 mg/dL — ABNORMAL LOW (ref 70–99)
Potassium: 4.4 mmol/L (ref 3.5–5.2)
Sodium: 143 mmol/L (ref 134–144)
Total Protein: 7.1 g/dL (ref 6.0–8.5)
eGFR: 98 mL/min/{1.73_m2} (ref >59)

## 2021-10-18 LAB — HEMOGLOBIN A1C
Est. average glucose Bld gHb Est-mCnc: 100 mg/dL
Hgb A1c MFr Bld: 5.1 % (ref 4.8–5.6)

## 2021-10-18 LAB — LIPID PANEL
Chol/HDL Ratio: 3.4 ratio (ref 0.0–4.4)
Cholesterol, Total: 198 mg/dL (ref 100–199)
HDL: 58 mg/dL (ref >39)
LDL Chol Calc (NIH): 127 mg/dL — ABNORMAL HIGH (ref 0–99)
Triglycerides: 72 mg/dL (ref 0–149)
VLDL Cholesterol Cal: 13 mg/dL (ref 5–40)

## 2022-01-07 ENCOUNTER — Other Ambulatory Visit: Payer: Self-pay | Admitting: Nurse Practitioner

## 2022-01-07 DIAGNOSIS — Z1231 Encounter for screening mammogram for malignant neoplasm of breast: Secondary | ICD-10-CM

## 2022-01-07 IMAGING — PT NM PET TUM IMG INITIAL (PI) SKULL BASE T - THIGH
1 of 7 series · 1 of 25 positions shown · non-contrast
Comparison: Multiple exams, including chest CT 06/18/2019

CLINICAL DATA: Initial treatment strategy for right upper lobe lung
nodule.

EXAM:
NUCLEAR MEDICINE PET SKULL BASE TO THIGH
TECHNIQUE: 6.5 mCi F-18 FDG was injected intravenously. Full-ring PET imaging
was performed from the skull base to thigh after the radiotracer. CT
data was obtained and used for attenuation correction and anatomic
localization.
Fasting blood glucose: 76 mg/dl

[Series 4: ct sk_thigh 5.0 b31f · axial · 5.0mm · 0.86mm/px · 1 of 218 slices shown]
[im 218/218  brain]
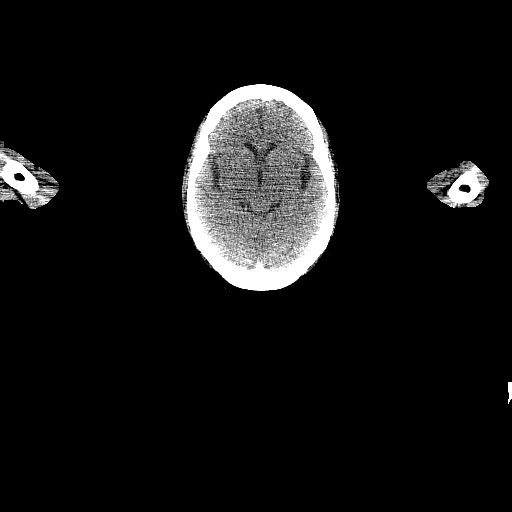

[1 of 25 positions shown; findings below may reference images not displayed]

FINDINGS: Mediastinal blood pool activity: SUV max

Liver activity: SUV max N/A

NECK: Bilaterally symmetric accentuated activity in the vicinity of
the arytenoid cartilages, maximum SUV 9.5 on the left and 9.4 on the
right, likely physiologic. Low-grade anterior paravertebral activity
bilaterally, likely physiologic or degenerative.

Incidental CT findings: none

CHEST: The medial right upper lobe nodule persists about 1.8 by
cm in size, maximum SUV 2.5. No other focal hypermetabolic activity
is appreciated in the chest.

Incidental CT findings: Emphysema. Scattered scarring. Scattered
interstitial accentuation [place] adjacent to some of the clustered
regions of acquired cystic lung disease, meriting surveillance by
CT.

ABDOMEN/PELVIS: Low-grade focal activity at the splenic flexure,
maximum SUV 2.9, probably incidental, but correlation with
colonoscopy/colon screening history recommended. There other small
foci of accentuated activity in the bowel, likely physiologic.

Activity medially in the right gluteus maximus muscle is likely
physiologic.

Incidental CT findings: Aortoiliac atherosclerotic vascular disease.
Sigmoid colon diverticulosis.

SKELETON: No significant abnormal hypermetabolic activity in this
region.

Incidental CT findings: none
IMPRESSION: 1. The irregular medial right upper lobe nodule shown on recent CT
persists and has a maximum SUV of 2.5. Given the lack of clearance
of this opacity, this likely reflects low-grade adenocarcinoma.
Tissue diagnosis suggested.
2. Emphysema with some interstitial accentuation along some of the
clustered regions of acquired cystic lung disease. None of these
demonstrate accentuated metabolic activity. These are likely
inflammatory but periodic surveillance is suggested.
3. Colon activity is most likely physiologic, although correlation
with the patient's colon screening history is recommended in
determining whether further workup of the colon is indicated.
4. Other imaging findings of potential clinical significance: Aortic
Atherosclerosis (AJDVI-HXI.I). Sigmoid colon diverticulosis.
Emphysema (AJDVI-4QA.H).

## 2022-02-05 ENCOUNTER — Ambulatory Visit: Payer: Medicaid Other

## 2022-03-08 ENCOUNTER — Ambulatory Visit
Admission: RE | Admit: 2022-03-08 | Discharge: 2022-03-08 | Disposition: A | Discharge status: Home / Self Care | Payer: Medicaid Other | Source: Ambulatory Visit | Attending: Nurse Practitioner | Admitting: Nurse Practitioner

## 2022-03-08 DIAGNOSIS — Z1231 Encounter for screening mammogram for malignant neoplasm of breast: Secondary | ICD-10-CM

## 2022-04-03 ENCOUNTER — Other Ambulatory Visit: Payer: Self-pay | Admitting: Nurse Practitioner

## 2022-04-03 DIAGNOSIS — I10 Essential (primary) hypertension: Secondary | ICD-10-CM

## 2022-04-27 ENCOUNTER — Other Ambulatory Visit: Payer: Self-pay | Admitting: Family Medicine

## 2022-04-27 DIAGNOSIS — I10 Essential (primary) hypertension: Secondary | ICD-10-CM

## 2022-05-01 ENCOUNTER — Other Ambulatory Visit: Payer: Self-pay | Admitting: Family Medicine

## 2022-05-01 DIAGNOSIS — I10 Essential (primary) hypertension: Secondary | ICD-10-CM

## 2022-05-18 ENCOUNTER — Other Ambulatory Visit: Payer: Self-pay | Admitting: Family Medicine

## 2022-05-18 DIAGNOSIS — I10 Essential (primary) hypertension: Secondary | ICD-10-CM

## 2022-05-20 NOTE — Telephone Encounter (Signed)
Requested medication (s) are due for refill today: routing for review  Requested medication (s) are on the active medication list: yes  Last refill:  04/03/22  Future visit scheduled: no  Notes to clinic:  Unable to refill per protocol, courtesy refill already given, routing for provider approval.      Requested Prescriptions  Pending Prescriptions Disp Refills   amLODipine (NORVASC) 5 MG tablet [Pharmacy Med Name: AMLODIPINE BESYLATE 5 MG TAB] 30 tablet 0    Sig: TAKE 1 TABLET (5 MG TOTAL) BY MOUTH DAILY.     Cardiovascular: Calcium Channel Blockers 2 Failed - 05/18/2022 12:47 PM      Failed - Valid encounter within last 6 months    Recent Outpatient Visits           7 months ago Essential hypertension   [place], Vernia Buff, NP   1 year ago Encounter for Papanicolaou smear for cervical cancer screening   [place], Vernia Buff, NP   1 year ago Primary hypertension   [place], Vernia Buff, NP   1 year ago Annual physical exam   [place], Vernia Buff, NP   3 years ago Essential hypertension   [place], Zelda W, NP       Future Appointments             In 1 month Gildardo Pounds, NP [place] BP in normal range    BP Readings from Last 1 Encounters:  10/17/21 120/81         Passed - Last Heart Rate in normal range    Pulse Readings from Last 1 Encounters:  10/17/21 60

## 2022-06-12 ENCOUNTER — Other Ambulatory Visit: Payer: Self-pay | Admitting: Family Medicine

## 2022-06-12 DIAGNOSIS — I10 Essential (primary) hypertension: Secondary | ICD-10-CM

## 2022-07-01 ENCOUNTER — Encounter: Payer: Self-pay | Admitting: Nurse Practitioner

## 2022-07-01 ENCOUNTER — Ambulatory Visit: Payer: Medicaid Other | Attending: Nurse Practitioner | Admitting: Nurse Practitioner

## 2022-07-01 VITALS — BP 119/74 | HR 65 | Ht 66.0 in | Wt 152.2 lb

## 2022-07-01 DIAGNOSIS — F259 Schizoaffective disorder, unspecified: Secondary | ICD-10-CM

## 2022-07-01 DIAGNOSIS — F17200 Nicotine dependence, unspecified, uncomplicated: Secondary | ICD-10-CM

## 2022-07-01 DIAGNOSIS — F319 Bipolar disorder, unspecified: Secondary | ICD-10-CM | POA: Insufficient documentation

## 2022-07-01 DIAGNOSIS — I10 Essential (primary) hypertension: Secondary | ICD-10-CM | POA: Diagnosis not present

## 2022-07-01 DIAGNOSIS — C884 Extranodal marginal zone b-cell lymphoma of mucosa-associated lymphoid tissue (malt-lymphoma) not having achieved remission: Secondary | ICD-10-CM

## 2022-07-01 DIAGNOSIS — Z8572 Personal history of non-Hodgkin lymphomas: Secondary | ICD-10-CM | POA: Insufficient documentation

## 2022-07-01 DIAGNOSIS — J439 Emphysema, unspecified: Secondary | ICD-10-CM | POA: Insufficient documentation

## 2022-07-01 DIAGNOSIS — F17210 Nicotine dependence, cigarettes, uncomplicated: Secondary | ICD-10-CM | POA: Diagnosis not present

## 2022-07-01 DIAGNOSIS — Z79899 Other long term (current) drug therapy: Secondary | ICD-10-CM | POA: Diagnosis not present

## 2022-07-01 DIAGNOSIS — B078 Other viral warts: Secondary | ICD-10-CM

## 2022-07-01 DIAGNOSIS — R7989 Other specified abnormal findings of blood chemistry: Secondary | ICD-10-CM

## 2022-07-01 DIAGNOSIS — M25512 Pain in left shoulder: Secondary | ICD-10-CM | POA: Diagnosis present

## 2022-07-01 DIAGNOSIS — J449 Chronic obstructive pulmonary disease, unspecified: Secondary | ICD-10-CM | POA: Diagnosis not present

## 2022-07-01 DIAGNOSIS — G8929 Other chronic pain: Secondary | ICD-10-CM | POA: Diagnosis not present

## 2022-07-01 DIAGNOSIS — F209 Schizophrenia, unspecified: Secondary | ICD-10-CM | POA: Insufficient documentation

## 2022-07-01 DIAGNOSIS — F5101 Primary insomnia: Secondary | ICD-10-CM

## 2022-07-01 DIAGNOSIS — Z122 Encounter for screening for malignant neoplasm of respiratory organs: Secondary | ICD-10-CM | POA: Diagnosis not present

## 2022-07-01 DIAGNOSIS — Z7951 Long term (current) use of inhaled steroids: Secondary | ICD-10-CM | POA: Diagnosis not present

## 2022-07-01 MED ORDER — TRAZODONE HCL 50 MG PO TABS: 50.0000 mg | ORAL_TABLET | Freq: Every day | ORAL | 3 refills | Status: DC

## 2022-07-01 MED ORDER — AMLODIPINE BESYLATE 5 MG PO TABS: 5.0000 mg | ORAL_TABLET | Freq: Every day | ORAL | 0 refills | Status: DC

## 2022-07-01 MED ORDER — ALBUTEROL SULFATE HFA 108 (90 BASE) MCG/ACT IN AERS: 2.0000 | INHALATION_SPRAY | Freq: Four times a day (QID) | RESPIRATORY_TRACT | 2 refills | Status: DC | PRN

## 2022-07-01 MED ORDER — UMECLIDINIUM-VILANTEROL 62.5-25 MCG/ACT IN AEPB: 1.0000 | INHALATION_SPRAY | Freq: Every day | RESPIRATORY_TRACT | 6 refills | Status: AC

## 2022-07-01 MED ORDER — CYCLOBENZAPRINE HCL 5 MG PO TABS: 5.0000 mg | ORAL_TABLET | Freq: Three times a day (TID) | ORAL | 1 refills | Status: DC | PRN

## 2022-07-01 NOTE — Progress Notes (Signed)
Assessment & Plan:  [name] was seen today for hypertension and medication refill.  Diagnoses and all orders for this visit:  Primary hypertension -     amLODipine (NORVASC) 5 MG tablet; Take 1 tablet (5 mg total) by mouth daily. -     CMP14+EGFR Continue all antihypertensives as prescribed.  Reminded to bring in blood pressure log for follow  up appointment.  RECOMMENDATIONS: DASH/Mediterranean Diets are healthier choices for HTN.    Screening for lung cancer -     CT CHEST LUNG CA SCREEN LOW DOSE W/O CM; Future  Tobacco dependence -     CT CHEST LUNG CA SCREEN LOW DOSE W/O CM; Future  COPD mixed type (HCC) -     albuterol (VENTOLIN HFA) 108 (90 Base) MCG/ACT inhaler; Inhale 2 puffs into the lungs every 6 (six) hours as needed for wheezing or shortness of breath. -     umeclidinium-vilanterol (ANORO ELLIPTA) 62.5-25 MCG/ACT AEPB; Inhale 1 puff into the lungs daily. Trying to quit smoking. 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  Primary insomnia -     traZODone (DESYREL) 50 MG tablet; Take 1-2 tablets (50-100 mg total) by mouth at bedtime.  Abnormal CBC -     CBC with Differential    Patient has been counseled on age-appropriate routine health concerns for screening and prevention. These are reviewed and up-to-date. Referrals have been placed accordingly. Immunizations are up-to-date or declined.    Subjective:   Chief Complaint  Patient presents with    Hypertension   Medication Refill   HPI [name] 62 y.o. female presents to office today for follow up to HTN  She has a past medical history of Anemia (1981), Bipolar 1 disorder, Depression, Emphysema lung, Hypertension, Lymphoma (overdue for follow up with Oncology), Mental disorder, and Schizophrenia     Tobacco Dependence Down to 2 cigarettes per day. Declines patch refills today. LUNG CT ordered   HTN Blood pressure is well controlled today with amlodipine 5 mg daily. She has a blood pressure monitor but does not check her blood pressure often.  BP Readings from Last 3 Encounters:  07/01/22 119/74  10/17/21 120/81  02/01/21 (!) 137/91     COPD Denies any recent COPD exacerbations.  Taking Anoro Ellipta daily as prescribed  Right Palmar wart See photo from 01-30-2021 States she was not treated well at [facility] and she would like to be evaluated by another Derm office. Upset that she was not given on topical anesthetic when the dermatologist from [place] started to open up the area on her hand.   She has chronic left shoulder pain. States she was drinking one night, fell and landed on her left shoulder. She has been unable to raise her arm past the mid axillary line for the past a few years. Negative radiographs 02-01-21   Review of Systems  Constitutional:  Negative for fever, malaise/fatigue and weight loss.  HENT: Negative.  Negative  for nosebleeds.   Eyes: Negative.  Negative for blurred vision, double vision and photophobia.  Respiratory: Negative.  Negative for cough and shortness of breath.   Cardiovascular: Negative.  Negative for chest pain, palpitations and leg swelling.  Gastrointestinal: Negative.  Negative for heartburn, nausea and vomiting.  Musculoskeletal:  Positive for joint pain. Negative for myalgias.  Skin:        SEE HPI  Neurological: Negative.  Negative for dizziness, focal weakness, seizures and headaches.   Psychiatric/Behavioral:  Negative for suicidal ideas. The patient has insomnia.     Past Medical History:  Diagnosis Date   Anemia 1981   During chilbirth   Bipolar 1 disorder ([place])    Depression    Emphysema lung ([place])    Hypertension    Lymphoma ([place])    Mental disorder    Schizophrenia ([place])     Past Surgical History:  Procedure Laterality Date   NECK SURGERY     cyst   TUBAL LIGATION      Family History  Problem Relation Age of Onset   Breast cancer Mother    Diabetes Mother    Hypertension Mother    Colon cancer Mother    Lung cancer Father        smoker   Hypertension Daughter    Breast cancer Maternal Aunt    Diabetes Maternal Aunt    Breast cancer Maternal Aunt    Breast cancer Maternal Aunt    Throat cancer Maternal Aunt    Hypertension Maternal Grandmother    Diabetes Maternal Grandmother    Breast cancer Cousin    Diabetes Brother    Diabetes Son     Social History Reviewed with no changes to be made today.   Outpatient Medications Prior to Visit  Medication Sig Dispense Refill   Blood Pressure Monitor DEVI Please provide patient with insurance approved blood pressure monitor 1 each 0   Multiple Vitamin (MULTIVITAMIN WITH MINERALS) TABS tablet Take 1 tablet by mouth daily. Women's One-A-Day Mulitvitamin     nicotine (NICODERM CQ - DOSED IN MG/24 HOURS) 14 mg/24hr patch PLACE 1 PATCH ONTO THE SKIN DAILY. 42 patch 0   acetaminophen-codeine (TYLENOL #3) 300-30 MG tablet Take 1-2 tablets by mouth every 4 (four) hours as needed for moderate pain. 30 tablet 0   albuterol (VENTOLIN HFA) 108 (90 Base) MCG/ACT inhaler Inhale 2 puffs into the lungs every 6 (six) hours as needed for wheezing or shortness of breath. 8 g 2   amLODipine (NORVASC) 5 MG tablet TAKE 1 TABLET (5 MG TOTAL) BY MOUTH DAILY. 90 tablet 0   cyclobenzaprine (FLEXERIL) 5 MG tablet Take 1 tablet (5 mg total) by mouth 3 (three) times daily as needed for muscle spasms. 30 tablet 1   traZODone  (DESYREL) 50 MG tablet Take 1-2 tablets (50-100 mg total) by mouth at bedtime. 180 tablet 3   umeclidinium-vilanterol (ANORO ELLIPTA) 62.5-25 MCG/ACT AEPB Inhale 1 puff into the lungs daily. 60 each 6   No facility-administered medications prior to visit.    Allergies  Allergen Reactions   Penicillins Hives and Swelling    REACTION: swelling of tongue; hives Did it involve swelling of the face/tongue/throat, SOB, or low BP? Yes Did it involve sudden or severe rash/hives, skin peeling, or any reaction on the inside of your mouth or nose? No Did you need to seek medical attention at a hospital or doctor's office? Yes When did it last happen? ~2014    If all  above answers are "NO", may proceed with cephalosporin use.        Objective:    BP 119/74   Pulse 65   Ht 5' 6"$  (1.676 m)   Wt 152 lb 3.2 oz (69 kg)   LMP 10/25/2012   SpO2 98%   BMI 24.57 kg/m  Wt Readings from Last 3 Encounters:  07/01/22 152 lb 3.2 oz (69 kg)  10/17/21 149 lb 12.8 oz (67.9 kg)  01/30/21 148 lb 8 oz (67.4 kg)    Physical Exam Vitals and nursing note reviewed.  Constitutional:      Appearance: She is well-developed.  HENT:     Head: Normocephalic and atraumatic.  Cardiovascular:     Rate and Rhythm: Normal rate and regular rhythm.     Heart sounds: Normal heart sounds. No murmur heard.    No friction rub. No gallop.  Pulmonary:     Effort: Pulmonary effort is normal. No tachypnea or respiratory distress.     Breath sounds: Normal breath sounds. No decreased breath sounds, wheezing, rhonchi or rales.  Chest:     Chest wall: No tenderness.  Abdominal:     General: Bowel sounds are normal.     Palpations: Abdomen is soft.  Musculoskeletal:        General: Normal range of motion.     Cervical back: Normal range of motion.  Skin:    General: Skin is warm and dry.  Neurological:     Mental Status: She is alert and oriented to person, place, and time.     Coordination: Coordination normal.   Psychiatric:        Behavior: Behavior normal. Behavior is cooperative.        Thought Content: Thought content normal.        Judgment: Judgment normal.          Patient has been counseled extensively about nutrition and exercise as well as the importance of adherence with medications and regular follow-up. The patient was given clear instructions to go to ER or return to medical center if symptoms don't improve, worsen or new problems develop. The patient verbalized understanding.   Follow-up: Return in about 6 months (around 12/30/2022).   Gildardo Pounds, FNP-BC [facility] [place], [place]   07/01/2022, 9:35 PM

## 2022-07-02 ENCOUNTER — Telehealth: Payer: Self-pay | Admitting: Hematology and Oncology

## 2022-07-02 LAB — CBC WITH DIFFERENTIAL/PLATELET
Basophils Absolute: 0 10*3/uL (ref 0.0–0.2)
Basos: 0 %
EOS (ABSOLUTE): 0.1 10*3/uL (ref 0.0–0.4)
Eos: 1 %
Hematocrit: 37.9 % (ref 34.0–46.6)
Hemoglobin: 12.7 g/dL (ref 11.1–15.9)
Immature Grans (Abs): 0 10*3/uL (ref 0.0–0.1)
Immature Granulocytes: 0 %
Lymphocytes Absolute: 2.2 10*3/uL (ref 0.7–3.1)
Lymphs: 36 %
MCH: 30.3 pg (ref 26.6–33.0)
MCHC: 33.5 g/dL (ref 31.5–35.7)
MCV: 91 fL (ref 79–97)
Monocytes Absolute: 0.4 10*3/uL (ref 0.1–0.9)
Monocytes: 6 %
Neutrophils Absolute: 3.5 10*3/uL (ref 1.4–7.0)
Neutrophils: 57 %
Platelets: 213 10*3/uL (ref 150–450)
RBC: 4.19 x10E6/uL (ref 3.77–5.28)
RDW: 13.3 % (ref 11.7–15.4)
WBC: 6.2 10*3/uL (ref 3.4–10.8)

## 2022-07-02 LAB — CMP14+EGFR
ALT: 9 IU/L (ref 0–32)
AST: 18 IU/L (ref 0–40)
Albumin/Globulin Ratio: 1.9 (ref 1.2–2.2)
Albumin: 4.9 g/dL (ref 3.9–4.9)
Alkaline Phosphatase: 86 IU/L (ref 44–121)
BUN/Creatinine Ratio: 15 (ref 12–28)
BUN: 12 mg/dL (ref 8–27)
Bilirubin Total: <0.2 mg/dL (ref 0.0–1.2)
CO2: 26 mmol/L (ref 20–29)
Calcium: 10 mg/dL (ref 8.7–10.3)
Chloride: 104 mmol/L (ref 96–106)
Creatinine, Ser: 0.78 mg/dL (ref 0.57–1.00)
Globulin, Total: 2.6 g/dL (ref 1.5–4.5)
Glucose: 59 mg/dL — ABNORMAL LOW (ref 70–99)
Potassium: 4.3 mmol/L (ref 3.5–5.2)
Sodium: 144 mmol/L (ref 134–144)
Total Protein: 7.5 g/dL (ref 6.0–8.5)
eGFR: 86 mL/min/{1.73_m2} (ref >59)

## 2022-07-02 NOTE — Telephone Encounter (Signed)
Scheduled appt per 2/19 referral. Pt already established with with Dr. Lorenso Courier. Pt's daughter is aware of appt date and time. Pt's daughter is aware to arrive 15 mins prior to appt time and to bring and updated insurance card. Pt's daughter is aware of appt location.

## 2022-07-25 ENCOUNTER — Ambulatory Visit
Admission: RE | Admit: 2022-07-25 | Discharge: 2022-07-25 | Disposition: A | Discharge status: Home / Self Care | Payer: Medicaid Other | Source: Ambulatory Visit | Attending: Nurse Practitioner | Admitting: Nurse Practitioner

## 2022-07-25 DIAGNOSIS — F17200 Nicotine dependence, unspecified, uncomplicated: Secondary | ICD-10-CM

## 2022-07-25 DIAGNOSIS — Z122 Encounter for screening for malignant neoplasm of respiratory organs: Secondary | ICD-10-CM

## 2022-07-31 ENCOUNTER — Inpatient Hospital Stay: Payer: Medicaid Other | Attending: Hematology and Oncology | Admitting: Hematology and Oncology

## 2022-07-31 ENCOUNTER — Other Ambulatory Visit: Payer: Self-pay | Admitting: Hematology and Oncology

## 2022-07-31 ENCOUNTER — Other Ambulatory Visit: Payer: Self-pay

## 2022-07-31 VITALS — BP 133/91 | HR 60 | Temp 98.3°F | Resp 16 | Wt 152.0 lb

## 2022-07-31 DIAGNOSIS — C884 Extranodal marginal zone b-cell lymphoma of mucosa-associated lymphoid tissue (malt-lymphoma) not having achieved remission: Secondary | ICD-10-CM

## 2022-07-31 DIAGNOSIS — Z800 Family history of malignant neoplasm of digestive organs: Secondary | ICD-10-CM | POA: Insufficient documentation

## 2022-07-31 DIAGNOSIS — F17210 Nicotine dependence, cigarettes, uncomplicated: Secondary | ICD-10-CM | POA: Insufficient documentation

## 2022-07-31 DIAGNOSIS — Z801 Family history of malignant neoplasm of trachea, bronchus and lung: Secondary | ICD-10-CM | POA: Insufficient documentation

## 2022-07-31 DIAGNOSIS — I10 Essential (primary) hypertension: Secondary | ICD-10-CM | POA: Diagnosis not present

## 2022-07-31 DIAGNOSIS — Z803 Family history of malignant neoplasm of breast: Secondary | ICD-10-CM | POA: Insufficient documentation

## 2022-07-31 NOTE — Progress Notes (Signed)
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 PET-CT suggested. 2) 07/19/2019: PET CT performed showed irregular medial right upper lobe nodule shown on recent CT persists and has a maximum SUV of 2.5. Additionally there was noted to be colon activity  3) 08/23/2019: Right Upper Lobe Wedge resection planned. Patient declined this procedure. 4)  09/23/2019: CT guided biopsy of the lesion performed, findings consistent with an extranodal marginal zone lymphoma of mucosal associated tissue (MALT) lymphoma is favored. 5) 10/07/2019: establish care with Dr. Lorenso Courier  6) 11/05/2019: established care with Dr. Isidore Moos in [place]. Started treatment on 11/18/2019.  7) 12/07/2019: ended radiation treatment 8) 07/31/2022: re-establish care, lost to follow up previously.   Interval History:  [name] 62 y.o. female with medical history significant for pulmonary MALT lymphoma who presents for a follow up visit. The patient's last visit was on 03/24/2020. In the interim since the last visit she was lost to follow up.   On exam today [name] reports she has been continuing to smoke cigarettes.  She notes that she is smoking 3 cigarettes a day.  She reports that she will "lighted and Barnie Del" and continue to smoke the same cigarette throughout the day.  She reports overall she is breathing good and only uses the inhaler sparingly.  She does not require it on daily basis.  She notes that she is gaining weight up to 152 pounds.  She denies any bumps, lumps, or signs or symptoms concerning for lymphadenopathy.  She has had no infections in the interim since her last visit.  She reports that she started no new medications and had no other  changes in her health.  Her energy level is good and she is trying to keep up with her 36-month-old 30 pound and 30 inch long grandbaby.  Overall she feels well with no questions concerns or complaints today.  Otherwise she denies having issues with fevers, chills, sweats, nausea, or diarrhea.  A full 10 point ROS is listed below.  MEDICAL HISTORY:  Past Medical History:  Diagnosis Date   Anemia 1981   During chilbirth   Bipolar 1 disorder ([place])    Depression    Emphysema lung ([place])    Hypertension    Lymphoma ([place])    Mental disorder    Schizophrenia ([place])     SURGICAL HISTORY: Past Surgical History:  Procedure Laterality Date   NECK SURGERY     cyst   TUBAL LIGATION      SOCIAL HISTORY: Social History   Socioeconomic History   Marital status: Divorced    Spouse name: Not on file   Number of children: 3   Years of education: Not on file   Highest education level: 9th grade  Occupational History   Occupation: retired  Tobacco Use   Smoking status: Every Day    Packs/day: 0.75    Years: 42.00    Additional pack years: 0.00    Total pack years: 31.50    Types: Cigarettes   Smokeless tobacco: Former    Types: Snuff   Tobacco comments:    pt smoking about 2 cig per day  Vaping Use   Vaping Use: Never used  Substance and Sexual Activity   Alcohol use: Not Currently  Alcohol/week: 4.0 standard drinks of alcohol    Types: 4 Cans of beer per week    Comment: clean for 2 weeks 11/11/19   Drug use: Yes    Frequency: 2.0 times per week    Types: Marijuana    Comment: last use of weed three days ago   Sexual activity: Yes    Birth control/protection: Surgical  Other Topics Concern   Not on file  Social History Narrative   Not on file   Social Determinants of Health   Financial Resource Strain: Not on file  Food Insecurity: Not on file  Transportation Needs: No Transportation Needs (12/24/2018)   PRAPARE - Hydrologist (Medical): No     Lack of Transportation (Non-Medical): No  Physical Activity: Not on file  Stress: Not on file  Social Connections: Not on file  Intimate Partner Violence: Not on file    FAMILY HISTORY: Family History  Problem Relation Age of Onset   Breast cancer Mother    Diabetes Mother    Hypertension Mother    Colon cancer Mother    Lung cancer Father        smoker   Hypertension Daughter    Breast cancer Maternal Aunt    Diabetes Maternal Aunt    Breast cancer Maternal Aunt    Breast cancer Maternal Aunt    Throat cancer Maternal Aunt    Hypertension Maternal Grandmother    Diabetes Maternal Grandmother    Breast cancer Cousin    Diabetes Brother    Diabetes Son     ALLERGIES:  is allergic to penicillins.  MEDICATIONS:  Current Outpatient Medications  Medication Sig Dispense Refill   albuterol (VENTOLIN HFA) 108 (90 Base) MCG/ACT inhaler Inhale 2 puffs into the lungs every 6 (six) hours as needed for wheezing or shortness of breath. 8 g 2   amLODipine (NORVASC) 5 MG tablet Take 1 tablet (5 mg total) by mouth daily. 90 tablet 0   Blood Pressure Monitor DEVI Please provide patient with insurance approved blood pressure monitor 1 each 0   cyclobenzaprine (FLEXERIL) 5 MG tablet Take 1 tablet (5 mg total) by mouth 3 (three) times daily as needed for muscle spasms. 30 tablet 1   Multiple Vitamin (MULTIVITAMIN WITH MINERALS) TABS tablet Take 1 tablet by mouth daily. Women's One-A-Day Mulitvitamin     nicotine (NICODERM CQ - DOSED IN MG/24 HOURS) 14 mg/24hr patch PLACE 1 PATCH ONTO THE SKIN DAILY. 42 patch 0   traZODone (DESYREL) 50 MG tablet Take 1-2 tablets (50-100 mg total) by mouth at bedtime. 180 tablet 3   umeclidinium-vilanterol (ANORO ELLIPTA) 62.5-25 MCG/ACT AEPB Inhale 1 puff into the lungs daily. 60 each 6   No current facility-administered medications for this visit.    REVIEW OF SYSTEMS:   Constitutional: ( - ) fevers, ( - )  chills , ( - ) night sweats Eyes: ( - )  blurriness of vision, ( - ) double vision, ( - ) watery eyes Ears, nose, mouth, throat, and face: ( - ) mucositis, ( - ) sore throat Respiratory: ( - ) cough, ( - ) dyspnea, ( - ) wheezes Cardiovascular: ( - ) palpitation, ( - ) chest discomfort, ( - ) lower extremity swelling Gastrointestinal:  ( - ) nausea, ( - ) heartburn, ( - ) change in bowel habits Skin: ( - ) abnormal skin rashes Lymphatics: ( - ) new lymphadenopathy, ( - ) easy bruising Neurological: ( - )  numbness, ( - ) tingling, ( - ) new weaknesses Behavioral/Psych: ( - ) mood change, ( - ) new changes  All other systems were reviewed with the patient and are negative.  PHYSICAL EXAMINATION: ECOG PERFORMANCE STATUS: 0 - Asymptomatic  Vitals:   07/31/22 0805  BP: (!) 133/91  Pulse: 60  Resp: 16  Temp: 98.3 F (36.8 C)  SpO2: 99%    Filed Weights   07/31/22 0805  Weight: 152 lb (68.9 kg)    GENERAL:well appearing middle aged [place] American female. alert, no distress and comfortable SKIN: skin color, texture, turgor are normal, no rashes or significant lesions EYES: conjunctiva are pink and non-injected, sclera clear LUNGS: clear to auscultation and percussion with normal breathing effort HEART: regular rate & rhythm and no murmurs and no lower extremity edema Musculoskeletal: no cyanosis of digits and no clubbing  PSYCH: alert & oriented x 3, fluent speech NEURO: no focal motor/sensory deficits  LABORATORY DATA:  I have reviewed the data as listed    Latest Ref Rng & Units 07/01/2022    3:59 PM 10/17/2021    1:53 PM 10/30/2020    1:56 PM  CBC  WBC 3.4 - 10.8 x10E3/uL 6.2  5.7  4.6   Hemoglobin 11.1 - 15.9 g/dL 12.7  12.0  13.1   Hematocrit 34.0 - 46.6 % 37.9  35.7  37.0   Platelets 150 - 450 x10E3/uL 213  232  175        Latest Ref Rng & Units 07/01/2022    3:59 PM 10/17/2021    1:53 PM 10/30/2020    1:56 PM  CMP  Glucose 70 - 99 mg/dL 59  59  79   BUN 8 - 27 mg/dL 12  6  6    Creatinine 0.57 - 1.00  mg/dL 0.78  0.70  0.79   Sodium 134 - 144 mmol/L 144  143  141   Potassium 3.5 - 5.2 mmol/L 4.3  4.4  4.8   Chloride 96 - 106 mmol/L 104  105  101   CO2 20 - 29 mmol/L 26  25  20    Calcium 8.7 - 10.3 mg/dL 10.0  9.9  9.5   Total Protein 6.0 - 8.5 g/dL 7.5  7.1  8.1   Total Bilirubin 0.0 - 1.2 mg/dL <0.2  <0.2  0.3   Alkaline Phos 44 - 121 IU/L 86  67  94   AST 0 - 40 IU/L 18  15  37   ALT 0 - 32 IU/L 9  6  16     RADIOGRAPHIC STUDIES: CT CHEST LUNG CA SCREEN LOW DOSE W/O CM  Result Date: 07/29/2022 CLINICAL DATA:  Current smoker, 35 pack-year history. History of lymphoma treated with radiation. EXAM: CT CHEST WITHOUT CONTRAST LOW-DOSE FOR LUNG CANCER SCREENING TECHNIQUE: Multidetector CT imaging of the chest was performed following the standard protocol without IV contrast. RADIATION DOSE REDUCTION: This exam was performed according to the departmental dose-optimization program which includes automated exposure control, adjustment of the mA and/or kV according to patient size and/or use of iterative reconstruction technique. COMPARISON:  08/20/2019. FINDINGS: Cardiovascular: Atherosclerotic calcification of the aorta. Heart size normal. No pericardial effusion. Mediastinum/Nodes: No pathologically enlarged mediastinal or axillary lymph nodes. Hilar regions are difficult to definitively evaluate without IV contrast. Esophagus is grossly unremarkable. Lungs/Pleura: Centrilobular emphysema. Smoking related respiratory bronchiolitis. Scattered pulmonary parenchymal scarring. 6.0 mm lateral segment right middle lobe nodule, unchanged. No new pulmonary nodules. No pleural fluid. Debris is seen  in the airway. Upper Abdomen: Visualized portions of the liver, gallbladder, adrenal glands and right kidney are unremarkable. Probable left renal sinus cyst. No specific follow-up necessary. Visualized portions of the spleen, pancreas, stomach and bowel are grossly unremarkable. No upper abdominal adenopathy.  Musculoskeletal: Degenerative changes in the spine. No worrisome lytic or sclerotic lesions. IMPRESSION: 1. Lung-RADS 2, benign appearance or behavior. Continue annual screening with low-dose chest CT without contrast in 12 months. 2.  Aortic atherosclerosis (ICD10-I70.0). 3.  Emphysema (ICD10-J43.9). Electronically Signed   By: Lorin Picket M.D.   On: 07/29/2022 10:47    ASSESSMENT & PLAN [name] 62 y.o. female with medical history significant for pulmonary MALT lymphoma who presents for a follow up visit.   Today we ordered the patients posttreatment PET CT scan, (approximately 12 weeks after completion of therapy).  Patient is otherwise asymptomatic and not having any concerning B symptoms.  We will repeat baseline labs today and have the patient return after in approximately 6 months, or sooner if there are concerning findings on the PET CT.   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  --Recent CT chest showed no evidence of residual/recurrent disease on 07/29/2022  --labs collected on 07/01/2022. These showed creatinine 0.78, white blood cell 6.2, hemoglobin 12.7, MCV 91, and platelets of 213 --RTC in 6 months time   #PET Avidity in the Colon --patient connected with GI, colonoscopy performed that did reveal polyps, but no active malignancy   No orders of the defined types were placed in this encounter.   All questions were answered. The patient knows to call the clinic with any problems, questions or concerns.  A total of more than 30 minutes were spent on this encounter and over half of that time was spent on counseling and coordination of care as outlined above.   Ledell Peoples, MD Department of Hematology/Oncology [place] [facility] Phone: [phone] Pager: [phone] Email: [email or web address]  07/31/2022 8:54 AM

## 2022-08-20 ENCOUNTER — Encounter: Payer: Self-pay | Admitting: Dermatology

## 2022-08-20 ENCOUNTER — Ambulatory Visit (INDEPENDENT_AMBULATORY_CARE_PROVIDER_SITE_OTHER): Payer: Medicaid Other | Admitting: Dermatology

## 2022-08-20 VITALS — BP 123/82 | HR 82

## 2022-08-20 DIAGNOSIS — B079 Viral wart, unspecified: Secondary | ICD-10-CM | POA: Diagnosis not present

## 2022-08-20 NOTE — Progress Notes (Unsigned)
   New Patient Visit   Subjective  [name] is a 62 y.o. female who presents for the following: Lesion on right palm. Dur: >20 years. Painful. Sore. Irritating. Used OTC wart freeze, helped some. No treatment by PCP. Did have x-ray in the past,unsure if this was visible on x-ray.    The following portions of the chart were reviewed this encounter and updated as appropriate: medications, allergies, medical history  Review of Systems:  No other skin or systemic complaints except as noted in HPI or Assessment and Plan.  Objective  Well appearing patient in no apparent distress; mood and affect are within normal limits.   A focused examination was performed of the following areas: Right palm  Relevant exam findings are noted in the Assessment and Plan.    Assessment & Plan    WART Exam: verrucous papule at right palm   Discussed viral / HPV (Human Papilloma Virus) etiology and risk of spread /infectivity to other areas of body as well as to other people.  Multiple treatments and methods may be required to clear warts and it is possible treatment may not be successful.  Treatment risks include discoloration; scarring and there is still potential for wart recurrence.  Treatment Plan: Destruction Procedure Note Destruction method: cryotherapy   Informed consent: discussed and consent obtained   Lesion destroyed using liquid nitrogen: Yes   Outcome: patient tolerated procedure well with no complications   Post-procedure details: wound care instructions given   Locations: right palm # of Lesions Treated: 1  Prior to procedure, discussed risks of blister formation, small wound, skin dyspigmentation, or rare scar following cryotherapy. Recommend Vaseline ointment to treated areas while healing.   Destruction Procedure Note Destruction method: chemical removal Informed consent: discussed and consent obtained   Chemical destruction method: Cantharone Plus Outcome: patient  tolerated procedure well with no complications   Post-procedure details: Advised to wash off with soap and water in 4 hours or sooner if it becomes tender before then. Locations: right palm # of Lesions Treated: 1  Prior to procedure discussed that Cantharidin Plus is a blistering agent that comes from a beetle.  It needs to be washed off in about 4 hours after application.  Although it is painless when applied in office, it may cause symptoms of mild pain and burning several hours later.  Treated areas will swell and turn red, and blisters may form.  Vaseline and a bandaid may be applied until wound has healed.  Once healed, the skin may remain temporarily discolored.  It can take weeks to months for pigmentation to return to normal.       Return for Wart Follow UP in 3-4 weeks.  I, Lawson Radar, CMA, am acting as scribe for Langston Reusing, MD.   Documentation: I have reviewed the above documentation for accuracy and completeness, and I agree with the above.  Langston Reusing, MD

## 2022-08-20 NOTE — Patient Instructions (Addendum)
Cryotherapy Aftercare  Wash gently with soap and water everyday.   Apply Vaseline and Band-Aid daily until healed.   Cantharidin Plus is a blistering agent that comes from a beetle.  It needs to be washed off in about 4 hours after application.  Although it is painless when applied in office, it may cause symptoms of mild pain and burning several hours later.  Treated areas will swell and turn red, and blisters may form.  Vaseline and a bandaid may be applied until wound has healed.  Once healed, the skin may remain temporarily discolored.  It can take weeks to months for pigmentation to return to normal.  Advised to wash off with soap and water in 4 hours or sooner if it becomes tender before then.   Due to recent changes in healthcare laws, you may see results of your pathology and/or laboratory studies on MyChart before the doctors have had a chance to review them. We understand that in some cases there may be results that are confusing or concerning to you. Please understand that not all results are received at the same time and often the doctors may need to interpret multiple results in order to provide you with the best plan of care or course of treatment. Therefore, we ask that you please give [place] 2 business days to thoroughly review all your results before contacting the office for clarification. Should we see a critical lab result, you will be contacted sooner.   If You Need Anything After Your Visit  If you have any questions or concerns for your doctor, please call our main line at [phone] If no one answers, please leave a voicemail as directed and we will return your call as soon as possible. Messages left after 4 pm will be answered the following business day.   You may also send [place] a message via MyChart. We typically respond to MyChart messages within 1-2 business days.  For prescription refills, please ask your pharmacy to contact our office. Our fax number is [phone].  If  you have an urgent issue when the clinic is closed that cannot wait until the next business day, you can page your doctor at the number below.    Please note that while we do our best to be available for urgent issues outside of office hours, we are not available 24/7.   If you have an urgent issue and are unable to reach [place], you may choose to seek medical care at your doctor's office, retail clinic, urgent care center, or emergency room.  If you have a medical emergency, please immediately call 911 or go to the emergency department. In the event of inclement weather, please call our main line at [phone] for an update on the status of any delays or closures.  Dermatology Medication Tips: Please keep the boxes that topical medications come in in order to help keep track of the instructions about where and how to use these. Pharmacies typically print the medication instructions only on the boxes and not directly on the medication tubes.   If your medication is too expensive, please contact our office at [phone] or send [place] a message through MyChart.   We are unable to tell what your co-pay for medications will be in advance as this is different depending on your insurance coverage. However, we may be able to find a substitute medication at lower cost or fill out paperwork to get insurance to cover a needed medication.   If a  prior authorization is required to get your medication covered by your insurance company, please allow [place] 1-2 business days to complete this process.  Drug prices often vary depending on where the prescription is filled and some pharmacies may offer cheaper prices.  The website www.goodrx.com contains coupons for medications through different pharmacies. The prices here do not account for what the cost may be with help from insurance (it may be cheaper with your insurance), but the website can give you the price if you did not use any insurance.  - You can print the  associated coupon and take it with your prescription to the pharmacy.  - You may also stop by our office during regular business hours and pick up a GoodRx coupon card.  - If you need your prescription sent electronically to a different pharmacy, notify our office through [facility] or by phone at [phone]

## 2022-10-01 ENCOUNTER — Encounter: Payer: Self-pay | Admitting: Dermatology

## 2022-10-01 ENCOUNTER — Ambulatory Visit (INDEPENDENT_AMBULATORY_CARE_PROVIDER_SITE_OTHER): Payer: Medicaid Other | Admitting: Dermatology

## 2022-10-01 DIAGNOSIS — B079 Viral wart, unspecified: Secondary | ICD-10-CM

## 2022-10-01 DIAGNOSIS — D485 Neoplasm of uncertain behavior of skin: Secondary | ICD-10-CM

## 2022-10-01 NOTE — Progress Notes (Signed)
   Follow-Up Visit   Subjective  [name] is a 63 y.o. female who presents for the following: Warts (Pt reports the wart blistered after treatment, but the central core still remains. Pt states she is not taking any home compound agents. Pt reports some soreness and tenderness at wart site.).   The following portions of the chart were reviewed this encounter and updated as appropriate:      Review of Systems: No other skin or systemic complaints.  Objective  Well appearing patient in no apparent distress; mood and affect are within normal limits.  A focused examination was performed including upper extremities, including the arms, hands, fingers, and fingernails. Relevant physical exam findings are noted in the Assessment and Plan.  Left Hand - Anterior Exam: verrucous papule(s)  Right Hand - Anterior 1cm hyperkeratic papules with essential plug   Assessment & Plan  Viral warts, unspecified type Left Hand - Anterior  Discussed viral / HPV (Human Papilloma Virus) etiology and risk of spread /infectivity to other areas of body as well as to other people.  Multiple treatments and methods may be required to clear warts and it is possible treatment may not be successful.  Treatment risks include discoloration; scarring and there is still potential for wart recurrence.  Treatment Plan: Pt wart grew after prior treatment. She c/o discomfort and pain with light palpation. Given the lesion has been there for several years, and has now become painful, we will perform shave bx and send for pathology to confirm wart dx.   Neoplasm of uncertain behavior of skin Right Hand - Anterior  Skin / nail biopsy Type of biopsy: tangential   Informed consent: discussed and consent obtained   Timeout: patient name, date of birth, surgical site, and procedure verified   Procedure prep:  Patient was prepped and draped in usual sterile fashion Prep type:  Isopropyl alcohol Anesthesia: the  lesion was anesthetized in a standard fashion   Anesthetic:  1% lidocaine w/ epinephrine 1-100,000 buffered w/ 8.4% NaHCO3 Instrument used: DermaBlade   Hemostasis achieved with: aluminum chloride   Outcome: patient tolerated procedure well   Post-procedure details: sterile dressing applied and wound care instructions given   Dressing type: petrolatum gauze and bandage    Specimen 1 - Surgical pathology Differential Diagnosis:  r/o wart vs other  Check Margins: No  I counseled the patient regarding the following: Instructions: Neoplasms of Uncertain Behavior can be observed, biopsied or surgically removed depending on the level of clinical suspicion. Pt agreeable to shave bx today   Return in about 1 month (around 11/01/2022) for Wart / bx follow up (15 min f/u apt).

## 2022-10-01 NOTE — Patient Instructions (Signed)
Patient Handout: Wound Care for Skin Biopsy Site  Patient Handout: Wound Care for Skin Biopsy Site  Taking Care of Your Skin Biopsy Site  Proper care of the biopsy site is essential for promoting healing and minimizing scarring. This handout provides instructions on how to care for your biopsy site to ensure optimal recovery.  1. Cleaning the Wound:  Clean the biopsy site daily with gentle soap and water. Gently pat the area dry with a clean, soft towel. Avoid harsh scrubbing or rubbing the area, as this can irritate the skin and delay healing.  2. Applying Aquaphor (Vaseline) and Bandage:  After cleaning the wound, apply a thin layer of Aquaphor ointment to the biopsy site. Cover the area with a sterile bandage to protect it from dirt, bacteria, and friction. Change the bandage daily or as needed if it becomes soiled or wet.  3. Continued Care for One Week:  Repeat the cleaning, Aquaphor application, and bandaging process daily for one week following the biopsy procedure. Keeping the wound clean and moist during this initial healing period will help prevent infection and promote optimal healing.  4. Massaging Aquaphor into the Area:  ---After one week, discontinue the use of bandages but continue to apply Aquaphor to the biopsy site. ----Gently massage the Aquaphor into the area using circular motions. ---Massaging the skin helps to promote circulation and prevent the formation of scar tissue.   Additional Tips:  Avoid exposing the biopsy site to direct sunlight during the healing process, as this can cause hyperpigmentation or worsen scarring. If you experience any signs of infection, such as increased redness, swelling, warmth, or drainage from the wound, contact your healthcare provider immediately. Follow any additional instructions provided by your healthcare provider for caring for the biopsy site and managing any discomfort. Conclusion:  Taking proper care of your skin  biopsy site is crucial for ensuring optimal healing and minimizing scarring. By following these instructions for cleaning, applying Aquaphor, and massaging the area, you can promote a smooth and successful recovery. If you have any questions or concerns about caring for your biopsy site, don't hesitate to contact your healthcare provider for guidance.    Due to recent changes in healthcare laws, you may see results of your pathology and/or laboratory studies on MyChart before the doctors have had a chance to review them. We understand that in some cases there may be results that are confusing or concerning to you. Please understand that not all results are received at the same time and often the doctors may need to interpret multiple results in order to provide you with the best plan of care or course of treatment. Therefore, we ask that you please give [place] 2 business days to thoroughly review all your results before contacting the office for clarification. Should we see a critical lab result, you will be contacted sooner.   If You Need Anything After Your Visit  If you have any questions or concerns for your doctor, please call our main line at [phone] If no one answers, please leave a voicemail as directed and we will return your call as soon as possible. Messages left after 4 pm will be answered the following business day.   You may also send [place] a message via MyChart. We typically respond to MyChart messages within 1-2 business days.  For prescription refills, please ask your pharmacy to contact our office. Our fax number is [phone].  If you have an urgent issue when the clinic is closed  that cannot wait until the next business day, you can page your doctor at the number below.    Please note that while we do our best to be available for urgent issues outside of office hours, we are not available 24/7.   If you have an urgent issue and are unable to reach [place], you may choose to seek  medical care at your doctor's office, retail clinic, urgent care center, or emergency room.  If you have a medical emergency, please immediately call 911 or go to the emergency department. In the event of inclement weather, please call our main line at [phone] for an update on the status of any delays or closures.  Dermatology Medication Tips: Please keep the boxes that topical medications come in in order to help keep track of the instructions about where and how to use these. Pharmacies typically print the medication instructions only on the boxes and not directly on the medication tubes.   If your medication is too expensive, please contact our office at [phone] or send [place] a message through MyChart.   We are unable to tell what your co-pay for medications will be in advance as this is different depending on your insurance coverage. However, we may be able to find a substitute medication at lower cost or fill out paperwork to get insurance to cover a needed medication.   If a prior authorization is required to get your medication covered by your insurance company, please allow [place] 1-2 business days to complete this process.  Drug prices often vary depending on where the prescription is filled and some pharmacies may offer cheaper prices.  The website www.goodrx.com contains coupons for medications through different pharmacies. The prices here do not account for what the cost may be with help from insurance (it may be cheaper with your insurance), but the website can give you the price if you did not use any insurance.  - You can print the associated coupon and take it with your prescription to the pharmacy.  - You may also stop by our office during regular business hours and pick up a GoodRx coupon card.  - If you need your prescription sent electronically to a different pharmacy, notify our office through [facility] or by phone at [phone]

## 2022-10-09 NOTE — Progress Notes (Signed)
[facility]  Dr. Onalee Hua reviewed your biopsy results and they showed the spot removed was a benign wart (not cancerous). The detailed report is available to view in MyChart.  Have a great day!  Kind Regards,  Dr. Kermit Balo Care Team

## 2022-11-04 ENCOUNTER — Ambulatory Visit: Payer: Medicaid Other | Admitting: Dermatology

## 2022-12-02 ENCOUNTER — Other Ambulatory Visit: Payer: Self-pay | Admitting: Nurse Practitioner

## 2022-12-02 DIAGNOSIS — I10 Essential (primary) hypertension: Secondary | ICD-10-CM

## 2022-12-03 NOTE — Telephone Encounter (Signed)
Requested Prescriptions  Pending Prescriptions Disp Refills   amLODipine (NORVASC) 5 MG tablet [Pharmacy Med Name: AMLODIPINE BESYLATE 5 MG TAB] 90 tablet 0    Sig: TAKE 1 TABLET (5 MG TOTAL) BY MOUTH DAILY.     Cardiovascular: Calcium Channel Blockers 2 Passed - 12/02/2022 10:23 AM      Passed - Last BP in normal range    BP Readings from Last 1 Encounters:  08/20/22 123/82         Passed - Last Heart Rate in normal range    Pulse Readings from Last 1 Encounters:  08/20/22 82         Passed - Valid encounter within last 6 months    Recent Outpatient Visits           5 months ago Primary hypertension   [facility] [place], Shea Stakes, NP   1 year ago Essential hypertension   [facility] [place], Shea Stakes, NP   1 year ago Encounter for Papanicolaou smear for cervical cancer screening   [facility] [place], Shea Stakes, NP   2 years ago Primary hypertension    [facility] [place], Shea Stakes, NP   2 years ago Annual physical exam   [facility] [place], Shea Stakes, NP       Future Appointments             In 3 weeks Claiborne Rigg, NP [facility]

## 2022-12-30 ENCOUNTER — Encounter: Payer: Self-pay | Admitting: Nurse Practitioner

## 2022-12-30 ENCOUNTER — Ambulatory Visit: Payer: MEDICAID | Attending: Nurse Practitioner | Admitting: Nurse Practitioner

## 2022-12-30 VITALS — BP 125/74 | HR 54 | Ht 66.0 in | Wt 145.4 lb

## 2022-12-30 DIAGNOSIS — I10 Essential (primary) hypertension: Secondary | ICD-10-CM | POA: Diagnosis not present

## 2022-12-30 DIAGNOSIS — M25552 Pain in left hip: Secondary | ICD-10-CM | POA: Diagnosis not present

## 2022-12-30 DIAGNOSIS — G8929 Other chronic pain: Secondary | ICD-10-CM

## 2022-12-30 DIAGNOSIS — E785 Hyperlipidemia, unspecified: Secondary | ICD-10-CM

## 2022-12-30 DIAGNOSIS — F5101 Primary insomnia: Secondary | ICD-10-CM

## 2022-12-30 DIAGNOSIS — E559 Vitamin D deficiency, unspecified: Secondary | ICD-10-CM

## 2022-12-30 DIAGNOSIS — R7989 Other specified abnormal findings of blood chemistry: Secondary | ICD-10-CM

## 2022-12-30 MED ORDER — CYCLOBENZAPRINE HCL 5 MG PO TABS: 5.0000 mg | ORAL_TABLET | Freq: Three times a day (TID) | ORAL | 1 refills | Status: AC | PRN

## 2022-12-30 MED ORDER — MELOXICAM 7.5 MG PO TABS: 7.5000 mg | ORAL_TABLET | Freq: Every day | ORAL | 0 refills | Status: DC

## 2022-12-30 MED ORDER — TRAZODONE HCL 50 MG PO TABS: 50.0000 mg | ORAL_TABLET | Freq: Every day | ORAL | 3 refills | Status: DC

## 2022-12-30 NOTE — Patient Instructions (Signed)
For pain Tylenol arthritis Turmeric Glucosamine

## 2022-12-30 NOTE — Progress Notes (Signed)
Assessment & Plan:  Zeniah was seen today for medical management of chronic issues.  Diagnoses and all orders for this visit:  Primary hypertension She requests to stop amlodipine at this time. Return in 4 weeks for BP check -     CMP14+EGFR  Chronic left hip pain -     DG Hip Unilat W OR W/O Pelvis Min 4 Views Left; Future -     cyclobenzaprine (FLEXERIL) 5 MG tablet; Take 1 tablet (5 mg total) by mouth 3 (three) times daily as needed for muscle spasms.        -Meloxicam 7.5 mg daily  Primary insomnia -     traZODone (DESYREL) 50 MG tablet; Take 1-2 tablets (50-100 mg total) by mouth at bedtime.  Vitamin D deficiency disease -     VITAMIN D 25 Hydroxy (Vit-D Deficiency, Fractures)  Dyslipidemia, goal LDL below 100 -     Lipid panel  Abnormal CBC -     CBC with Differential    Patient has been counseled on age-appropriate routine health concerns for screening and prevention. These are reviewed and up-to-date. Referrals have been placed accordingly. Immunizations are up-to-date or declined.    Subjective:   Chief Complaint  Patient presents with   Medical Management of Chronic Issues   HPI [name] 62 y.o. female presents to office today for follow up to HTN.    HTN Blood pressure is well controlled. She would like to stop amlodipine at this time and see if her blood pressure is normal without it.  BP Readings from Last 3 Encounters:  12/30/22 125/74  08/20/22 123/82  07/31/22 (!) 133/91    Hip Pain: Patient complains of left hip pain. Onset of the symptoms was several months ago. Inciting event: none. Current symptoms include is worse with weight bearing, is aggravated by walking, and is worse after period of inactivity. Associated symptoms: none.  Patient's overall course: symptoms have progressed to a point and plateaued, Patient has had no prior hip problems. Previous visits for this problem: none. Evaluation to date: none.  Treatment to date:  BC  arthritis .   Review of Systems  Constitutional:  Negative for fever, malaise/fatigue and weight loss.  HENT: Negative.  Negative for nosebleeds.   Eyes: Negative.  Negative for blurred vision, double vision and photophobia.  Respiratory: Negative.  Negative for cough and shortness of breath.   Cardiovascular: Negative.  Negative for chest pain, palpitations and leg swelling.  Gastrointestinal: Negative.  Negative for heartburn, nausea and vomiting.  Musculoskeletal:  Positive for joint pain. Negative for myalgias.  Neurological: Negative.  Negative for dizziness, focal weakness, seizures and headaches.  Psychiatric/Behavioral:  Negative for suicidal ideas. The patient has insomnia.     Past Medical History:  Diagnosis Date   Anemia 1981   During chilbirth   Bipolar 1 disorder (HCC)    Depression    Emphysema lung (HCC)    Hypertension    Lymphoma (HCC)    Mental disorder    Schizophrenia (HCC)     Past Surgical History:  Procedure Laterality Date   NECK SURGERY     cyst   TUBAL LIGATION      Family History  Problem Relation Age of Onset   Breast cancer Mother    Diabetes Mother    Hypertension Mother    Colon cancer Mother    Lung cancer Father        smoker   Hypertension Daughter  Breast cancer Maternal Aunt    Diabetes Maternal Aunt    Breast cancer Maternal Aunt    Breast cancer Maternal Aunt    Throat cancer Maternal Aunt    Hypertension Maternal Grandmother    Diabetes Maternal Grandmother    Breast cancer Cousin    Diabetes Brother    Diabetes Son     Social History Reviewed with no changes to be made today.   Outpatient Medications Prior to Visit  Medication Sig Dispense Refill   albuterol (VENTOLIN HFA) 108 (90 Base) MCG/ACT inhaler Inhale 2 puffs into the lungs every 6 (six) hours as needed for wheezing or shortness of breath. 8 g 2   amLODipine (NORVASC) 5 MG tablet TAKE 1 TABLET (5 MG TOTAL) BY MOUTH DAILY. 90 tablet 0   Blood Pressure  Monitor DEVI Please provide patient with insurance approved blood pressure monitor 1 each 0   Multiple Vitamin (MULTIVITAMIN WITH MINERALS) TABS tablet Take 1 tablet by mouth daily. Women's One-A-Day Mulitvitamin     umeclidinium-vilanterol (ANORO ELLIPTA) 62.5-25 MCG/ACT AEPB Inhale 1 puff into the lungs daily. 60 each 6   cyclobenzaprine (FLEXERIL) 5 MG tablet Take 1 tablet (5 mg total) by mouth 3 (three) times daily as needed for muscle spasms. 30 tablet 1   traZODone (DESYREL) 50 MG tablet Take 1-2 tablets (50-100 mg total) by mouth at bedtime. 180 tablet 3   nicotine (NICODERM CQ - DOSED IN MG/24 HOURS) 14 mg/24hr patch PLACE 1 PATCH ONTO THE SKIN DAILY. (Patient not taking: Reported on 12/30/2022) 42 patch 0   No facility-administered medications prior to visit.    Allergies  Allergen Reactions   Penicillins Hives and Swelling    REACTION: swelling of tongue; hives Did it involve swelling of the face/tongue/throat, SOB, or low BP? Yes Did it involve sudden or severe rash/hives, skin peeling, or any reaction on the inside of your mouth or nose? No Did you need to seek medical attention at a hospital or doctor's office? Yes When did it last happen? ~2014    If all above answers are "NO", may proceed with cephalosporin use.        Objective:    BP 125/74 (BP Location: Left Arm, Patient Position: Sitting, Cuff Size: Normal)   Pulse (!) 54   Ht 5\' 6"  (1.676 m)   Wt 145 lb 6.4 oz (66 kg)   LMP 10/25/2012   SpO2 100%   BMI 23.47 kg/m  Wt Readings from Last 3 Encounters:  12/30/22 145 lb 6.4 oz (66 kg)  07/31/22 152 lb (68.9 kg)  07/01/22 152 lb 3.2 oz (69 kg)    Physical Exam Vitals and nursing note reviewed.  Constitutional:      Appearance: She is well-developed.  HENT:     Head: Normocephalic and atraumatic.  Cardiovascular:     Rate and Rhythm: Normal rate and regular rhythm.     Heart sounds: Normal heart sounds. No murmur heard.    No friction rub. No gallop.   Pulmonary:     Effort: Pulmonary effort is normal. No tachypnea or respiratory distress.     Breath sounds: Normal breath sounds. No decreased breath sounds, wheezing, rhonchi or rales.  Chest:     Chest wall: No tenderness.  Abdominal:     General: Bowel sounds are normal.     Palpations: Abdomen is soft.  Musculoskeletal:        General: Normal range of motion.     Cervical back: Normal range  of motion.  Skin:    General: Skin is warm and dry.  Neurological:     Mental Status: She is alert and oriented to person, place, and time.     Coordination: Coordination normal.  Psychiatric:        Behavior: Behavior normal. Behavior is cooperative.        Thought Content: Thought content normal.        Judgment: Judgment normal.          Patient has been counseled extensively about nutrition and exercise as well as the importance of adherence with medications and regular follow-up. The patient was given clear instructions to go to ER or return to medical center if symptoms don't improve, worsen or new problems develop. The patient verbalized understanding.   Follow-up: Return in about 4 weeks (around 01/27/2023) for bp check with me.   Claiborne Rigg, FNP-BC [facility] [place], [place] [phone]   12/30/2022, 2:58 PM

## 2023-01-27 ENCOUNTER — Ambulatory Visit: Payer: MEDICAID

## 2023-01-27 ENCOUNTER — Encounter: Payer: Self-pay | Admitting: Nurse Practitioner

## 2023-01-27 ENCOUNTER — Ambulatory Visit: Payer: MEDICAID | Attending: Nurse Practitioner | Admitting: Nurse Practitioner

## 2023-01-27 VITALS — BP 119/84 | HR 68 | Ht 66.0 in | Wt 144.4 lb

## 2023-01-27 DIAGNOSIS — I10 Essential (primary) hypertension: Secondary | ICD-10-CM

## 2023-01-27 DIAGNOSIS — E785 Hyperlipidemia, unspecified: Secondary | ICD-10-CM

## 2023-01-27 DIAGNOSIS — M25551 Pain in right hip: Secondary | ICD-10-CM

## 2023-01-27 DIAGNOSIS — E559 Vitamin D deficiency, unspecified: Secondary | ICD-10-CM | POA: Diagnosis not present

## 2023-01-27 DIAGNOSIS — F17200 Nicotine dependence, unspecified, uncomplicated: Secondary | ICD-10-CM

## 2023-01-27 DIAGNOSIS — R7989 Other specified abnormal findings of blood chemistry: Secondary | ICD-10-CM

## 2023-01-27 MED ORDER — TRAMADOL HCL 50 MG PO TABS
50.0000 mg | ORAL_TABLET | Freq: Three times a day (TID) | ORAL | 0 refills | Status: AC | PRN
Start: 2023-01-27 — End: ?

## 2023-01-27 MED ORDER — NICOTINE 21 MG/24HR TD PT24
21.0000 mg | MEDICATED_PATCH | Freq: Every day | TRANSDERMAL | 0 refills | Status: AC
Start: 2023-01-27 — End: 2023-03-10

## 2023-01-27 MED ORDER — AMLODIPINE BESYLATE 5 MG PO TABS
5.0000 mg | ORAL_TABLET | Freq: Every day | ORAL | 1 refills | Status: DC
Start: 2023-01-27 — End: 2023-09-04

## 2023-01-27 NOTE — Progress Notes (Signed)
Assessment & Plan:  [name] was seen today for medical management of chronic issues.  Diagnoses and all orders for this visit:  Primary hypertension -     CMP14+EGFR -     amLODipine (NORVASC) 5 MG tablet; Take 1 tablet (5 mg total) by mouth daily. Continue all antihypertensives as prescribed.  Reminded to bring in blood pressure log for follow  up appointment.  RECOMMENDATIONS: DASH/Mediterranean Diets are healthier choices for HTN.    Tobacco dependence Desires to quit smoking. Down to 3-4 cigs a day however 14 mcg patch was not effective -     nicotine (NICODERM CQ - DOSED IN MG/24 HOURS) 21 mg/24hr patch; Place 1 patch (21 mg total) onto the skin daily.  Right hip pain -     traMADol (ULTRAM) 50 MG tablet; Take 1 tablet (50 mg total) by mouth every 8 (eight) hours as needed.  Vitamin D deficiency disease -     VITAMIN D 25 Hydroxy (Vit-D Deficiency, Fractures)  Dyslipidemia, goal LDL below 100 -     Lipid panel  Abnormal CBC -     CBC with Differential    Patient has been counseled on age-appropriate routine health concerns for screening and prevention. These are reviewed and up-to-date. Referrals have been placed accordingly. Immunizations are up-to-date or declined.    Subjective:   Chief Complaint  Patient presents with   Medical Management of Chronic Issues   HPI [name] 62 y.o. female presents to office today for follow up to HTN   HTN During her last visit with me in August she requested to stop amlodipine to see if her blood pressure would permit going without any antihypertensive. Today she states she never stopped taking it as we had discussed.  BP Readings from Last 3 Encounters:  01/27/23 119/84  12/30/22 125/74  08/20/22 123/82     Hip Pain: Patient complains of right hip pain. Onset of the symptoms was 2 days ago. Inciting event: fell while sitting in a rolling chair . Current symptoms include hematoma on right buttock that is painful to  touch. Aggravating symptoms: standing, walking, and sitting Evaluation to date: none.  Treatment to date:  ASA products OTC which I have asked her to avoid .   Review of Systems  Constitutional:  Negative for fever, malaise/fatigue and weight loss.  HENT: Negative.  Negative for nosebleeds.   Eyes: Negative.  Negative for blurred vision, double vision and photophobia.  Respiratory: Negative.  Negative for cough and shortness of breath.   Cardiovascular: Negative.  Negative for chest pain, palpitations and leg swelling.  Gastrointestinal: Negative.  Negative for heartburn, nausea and vomiting.  Musculoskeletal:  Positive for joint pain. Negative for myalgias.  Neurological: Negative.  Negative for dizziness, focal weakness, seizures and headaches.  Psychiatric/Behavioral: Negative.  Negative for suicidal ideas.     Past Medical History:  Diagnosis Date   Anemia 1981   During chilbirth   Bipolar 1 disorder (HCC)    Depression    Emphysema lung (HCC)    Hypertension    Lymphoma (HCC)    Mental disorder    Schizophrenia (HCC)     Past Surgical History:  Procedure Laterality Date   NECK SURGERY     cyst   TUBAL LIGATION      Family History  Problem Relation Age of Onset   Breast cancer Mother    Diabetes Mother    Hypertension Mother    Colon cancer Mother  Lung cancer Father        smoker   Hypertension Daughter    Breast cancer Maternal Aunt    Diabetes Maternal Aunt    Breast cancer Maternal Aunt    Breast cancer Maternal Aunt    Throat cancer Maternal Aunt    Hypertension Maternal Grandmother    Diabetes Maternal Grandmother    Breast cancer Cousin    Diabetes Brother    Diabetes Son     Social History Reviewed with no changes to be made today.   Outpatient Medications Prior to Visit  Medication Sig Dispense Refill   albuterol (VENTOLIN HFA) 108 (90 Base) MCG/ACT inhaler Inhale 2 puffs into the lungs every 6 (six) hours as needed for wheezing or shortness  of breath. 8 g 2   Blood Pressure Monitor DEVI Please provide patient with insurance approved blood pressure monitor 1 each 0   cyclobenzaprine (FLEXERIL) 5 MG tablet Take 1 tablet (5 mg total) by mouth 3 (three) times daily as needed for muscle spasms. 30 tablet 1   meloxicam (MOBIC) 7.5 MG tablet Take 1 tablet (7.5 mg total) by mouth daily. Left hip pain. Take with food. Do not take with aleve, motrin, BC, goody powder, Naproxen or ibuprofen. 30 tablet 0   Multiple Vitamin (MULTIVITAMIN WITH MINERALS) TABS tablet Take 1 tablet by mouth daily. Women's One-A-Day Mulitvitamin     traZODone (DESYREL) 50 MG tablet Take 1-2 tablets (50-100 mg total) by mouth at bedtime. 180 tablet 3   umeclidinium-vilanterol (ANORO ELLIPTA) 62.5-25 MCG/ACT AEPB Inhale 1 puff into the lungs daily. 60 each 6   amLODipine (NORVASC) 5 MG tablet TAKE 1 TABLET (5 MG TOTAL) BY MOUTH DAILY. 90 tablet 0   No facility-administered medications prior to visit.    Allergies  Allergen Reactions   Penicillins Hives and Swelling    REACTION: swelling of tongue; hives Did it involve swelling of the face/tongue/throat, SOB, or low BP? Yes Did it involve sudden or severe rash/hives, skin peeling, or any reaction on the inside of your mouth or nose? No Did you need to seek medical attention at a hospital or doctor's office? Yes When did it last happen? ~2014    If all above answers are "NO", may proceed with cephalosporin use.        Objective:    BP 119/84 (BP Location: Left Arm, Patient Position: Sitting, Cuff Size: Normal)   Pulse 68   Ht 5\' 6"  (1.676 m)   Wt 144 lb 6.4 oz (65.5 kg)   LMP 10/25/2012   SpO2 99%   BMI 23.31 kg/m  Wt Readings from Last 3 Encounters:  01/27/23 144 lb 6.4 oz (65.5 kg)  12/30/22 145 lb 6.4 oz (66 kg)  07/31/22 152 lb (68.9 kg)    Physical Exam Vitals and nursing note reviewed.  Constitutional:      Appearance: She is well-developed.  HENT:     Head: Normocephalic and atraumatic.   Cardiovascular:     Rate and Rhythm: Normal rate and regular rhythm.     Heart sounds: Normal heart sounds. No murmur heard.    No friction rub. No gallop.  Pulmonary:     Effort: Pulmonary effort is normal. No tachypnea or respiratory distress.     Breath sounds: Normal breath sounds. No decreased breath sounds, wheezing, rhonchi or rales.  Chest:     Chest wall: No tenderness.  Abdominal:     General: Bowel sounds are normal.     Palpations:  Abdomen is soft.  Musculoskeletal:        General: Normal range of motion.     Cervical back: Normal range of motion.  Skin:    General: Skin is warm and dry.     Findings: Bruising present.          Comments: Approximate size of hematoma 15-20cm  Neurological:     Mental Status: She is alert and oriented to person, place, and time.     Coordination: Coordination normal.  Psychiatric:        Behavior: Behavior normal. Behavior is cooperative.        Thought Content: Thought content normal.        Judgment: Judgment normal.          Patient has been counseled extensively about nutrition and exercise as well as the importance of adherence with medications and regular follow-up. The patient was given clear instructions to go to ER or return to medical center if symptoms don't improve, worsen or new problems develop. The patient verbalized understanding.   Follow-up: Return in about 6 weeks (around 03/10/2023) for hematoma.   Claiborne Rigg, FNP-BC [facility] [place], [place] [phone]   01/27/2023, 3:05 PM

## 2023-01-28 ENCOUNTER — Other Ambulatory Visit: Payer: Self-pay | Admitting: Nurse Practitioner

## 2023-01-28 DIAGNOSIS — G8929 Other chronic pain: Secondary | ICD-10-CM

## 2023-01-28 LAB — CBC WITH DIFFERENTIAL/PLATELET
Basophils Absolute: 0 10*3/uL (ref 0.0–0.2)
Basos: 1 %
EOS (ABSOLUTE): 0.1 10*3/uL (ref 0.0–0.4)
Eos: 1 %
Hematocrit: 35.6 % (ref 34.0–46.6)
Hemoglobin: 11.8 g/dL (ref 11.1–15.9)
Immature Grans (Abs): 0 10*3/uL (ref 0.0–0.1)
Immature Granulocytes: 0 %
Lymphocytes Absolute: 2.5 10*3/uL (ref 0.7–3.1)
Lymphs: 38 %
MCH: 30.8 pg (ref 26.6–33.0)
MCHC: 33.1 g/dL (ref 31.5–35.7)
MCV: 93 fL (ref 79–97)
Monocytes Absolute: 0.4 10*3/uL (ref 0.1–0.9)
Monocytes: 6 %
Neutrophils Absolute: 3.6 10*3/uL (ref 1.4–7.0)
Neutrophils: 54 %
Platelets: 245 10*3/uL (ref 150–450)
RBC: 3.83 x10E6/uL (ref 3.77–5.28)
RDW: 13.4 % (ref 11.7–15.4)
WBC: 6.6 10*3/uL (ref 3.4–10.8)

## 2023-01-28 LAB — LIPID PANEL
Chol/HDL Ratio: 3.7 ratio (ref 0.0–4.4)
Cholesterol, Total: 209 mg/dL — ABNORMAL HIGH (ref 100–199)
HDL: 56 mg/dL (ref >39)
LDL Chol Calc (NIH): 141 mg/dL — ABNORMAL HIGH (ref 0–99)
Triglycerides: 65 mg/dL (ref 0–149)
VLDL Cholesterol Cal: 12 mg/dL (ref 5–40)

## 2023-01-28 LAB — CMP14+EGFR
ALT: 5 IU/L (ref 0–32)
AST: 16 IU/L (ref 0–40)
Albumin: 4.5 g/dL (ref 3.9–4.9)
Alkaline Phosphatase: 89 IU/L (ref 44–121)
BUN/Creatinine Ratio: 10 — ABNORMAL LOW (ref 12–28)
BUN: 7 mg/dL — ABNORMAL LOW (ref 8–27)
Bilirubin Total: 0.3 mg/dL (ref 0.0–1.2)
CO2: 24 mmol/L (ref 20–29)
Calcium: 9.4 mg/dL (ref 8.7–10.3)
Chloride: 105 mmol/L (ref 96–106)
Creatinine, Ser: 0.71 mg/dL (ref 0.57–1.00)
Globulin, Total: 3 g/dL (ref 1.5–4.5)
Glucose: 86 mg/dL (ref 70–99)
Potassium: 4.4 mmol/L (ref 3.5–5.2)
Sodium: 144 mmol/L (ref 134–144)
Total Protein: 7.5 g/dL (ref 6.0–8.5)
eGFR: 96 mL/min/{1.73_m2} (ref >59)

## 2023-01-28 LAB — VITAMIN D 25 HYDROXY (VIT D DEFICIENCY, FRACTURES): Vit D, 25-Hydroxy: 13.3 ng/mL — ABNORMAL LOW (ref 30.0–100.0)

## 2023-01-28 NOTE — Progress Notes (Unsigned)
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 PET-CT suggested. 2) 07/19/2019: PET CT performed showed irregular medial right upper lobe nodule shown on recent CT persists and has a maximum SUV of 2.5. Additionally there was noted to be colon activity  3) 08/23/2019: Right Upper Lobe Wedge resection planned. Patient declined this procedure. 4)  09/23/2019: CT guided biopsy of the lesion performed, findings consistent with an extranodal marginal zone lymphoma of mucosal associated tissue (MALT) lymphoma is favored. 5) 10/07/2019: establish care with Dr. Leonides Schanz  6) 11/05/2019: established care with Dr. Basilio Cairo in Rad/Onc. Started treatment on 11/18/2019.  7) 12/07/2019: ended radiation treatment 8) 07/31/2022: re-establish care, lost to follow up previously.   Interval History:  [name] 62 y.o. female with medical history significant for pulmonary MALT lymphoma who presents for a follow up visit. The patient's last visit was on 08/04/2022. In the interim since the last visit she was lost to follow up.   On exam today [name] reports ***.  Otherwise she denies having issues with fevers, chills, sweats, nausea, or diarrhea.  A full 10 point ROS is listed below.  MEDICAL HISTORY:  Past Medical History:  Diagnosis Date   Anemia 1981   During chilbirth   Bipolar 1 disorder (HCC)    Depression    Emphysema lung (HCC)    Hypertension    Lymphoma (HCC)    Mental disorder    Schizophrenia (HCC)     SURGICAL HISTORY: Past Surgical History:  Procedure Laterality Date   NECK SURGERY     cyst   TUBAL LIGATION      SOCIAL HISTORY: Social History   Socioeconomic History   Marital status: Divorced    Spouse  name: Not on file   Number of children: 3   Years of education: Not on file   Highest education level: 9th grade  Occupational History   Occupation: retired  Tobacco Use   Smoking status: Every Day    Current packs/day: 0.75    Average packs/day: 0.8 packs/day for 42.0 years (31.5 ttl pk-yrs)    Types: Cigarettes   Smokeless tobacco: Former    Types: Snuff   Tobacco comments:    pt smoking about 2 cig per day  Vaping Use   Vaping status: Never Used  Substance and Sexual Activity   Alcohol use: Not Currently    Alcohol/week: 4.0 standard drinks of alcohol    Types: 4 Cans of beer per week    Comment: clean for 2 weeks 11/11/19   Drug use: Yes    Frequency: 2.0 times per week    Types: Marijuana    Comment: last use of weed three days ago   Sexual activity: Yes    Birth control/protection: Surgical  Other Topics Concern   Not on file  Social History Narrative   Not on file   Social Determinants of Health   Financial Resource Strain: Not on file  Food Insecurity: Not on file  Transportation Needs: No Transportation Needs (12/24/2018)   PRAPARE - Transportation    Lack of Transportation (Medical): No    Lack of Transportation (Non-Medical): No  Physical Activity: Not on file  Stress: Not on file  Social Connections: Not on file  Intimate Partner  Violence: Not on file    FAMILY HISTORY: Family History  Problem Relation Age of Onset   Breast cancer Mother    Diabetes Mother    Hypertension Mother    Colon cancer Mother    Lung cancer Father        smoker   Hypertension Daughter    Breast cancer Maternal Aunt    Diabetes Maternal Aunt    Breast cancer Maternal Aunt    Breast cancer Maternal Aunt    Throat cancer Maternal Aunt    Hypertension Maternal Grandmother    Diabetes Maternal Grandmother    Breast cancer Cousin    Diabetes Brother    Diabetes Son     ALLERGIES:  is allergic to penicillins.  MEDICATIONS:  Current Outpatient Medications  Medication  Sig Dispense Refill   albuterol (VENTOLIN HFA) 108 (90 Base) MCG/ACT inhaler Inhale 2 puffs into the lungs every 6 (six) hours as needed for wheezing or shortness of breath. 8 g 2   amLODipine (NORVASC) 5 MG tablet Take 1 tablet (5 mg total) by mouth daily. 90 tablet 1   Blood Pressure Monitor DEVI Please provide patient with insurance approved blood pressure monitor 1 each 0   cyclobenzaprine (FLEXERIL) 5 MG tablet Take 1 tablet (5 mg total) by mouth 3 (three) times daily as needed for muscle spasms. 30 tablet 1   meloxicam (MOBIC) 7.5 MG tablet Take 1 tablet (7.5 mg total) by mouth daily. Left hip pain. Take with food. Do not take with aleve, motrin, BC, goody powder, Naproxen or ibuprofen. 30 tablet 0   Multiple Vitamin (MULTIVITAMIN WITH MINERALS) TABS tablet Take 1 tablet by mouth daily. Women's One-A-Day Mulitvitamin     nicotine (NICODERM CQ - DOSED IN MG/24 HOURS) 21 mg/24hr patch Place 1 patch (21 mg total) onto the skin daily. 42 patch 0   traMADol (ULTRAM) 50 MG tablet Take 1 tablet (50 mg total) by mouth every 8 (eight) hours as needed. 30 tablet 0   traZODone (DESYREL) 50 MG tablet Take 1-2 tablets (50-100 mg total) by mouth at bedtime. 180 tablet 3   umeclidinium-vilanterol (ANORO ELLIPTA) 62.5-25 MCG/ACT AEPB Inhale 1 puff into the lungs daily. 60 each 6   No current facility-administered medications for this visit.    REVIEW OF SYSTEMS:   Constitutional: ( - ) fevers, ( - )  chills , ( - ) night sweats Eyes: ( - ) blurriness of vision, ( - ) double vision, ( - ) watery eyes Ears, nose, mouth, throat, and face: ( - ) mucositis, ( - ) sore throat Respiratory: ( - ) cough, ( - ) dyspnea, ( - ) wheezes Cardiovascular: ( - ) palpitation, ( - ) chest discomfort, ( - ) lower extremity swelling Gastrointestinal:  ( - ) nausea, ( - ) heartburn, ( - ) change in bowel habits Skin: ( - ) abnormal skin rashes Lymphatics: ( - ) new lymphadenopathy, ( - ) easy bruising Neurological: ( - )  numbness, ( - ) tingling, ( - ) new weaknesses Behavioral/Psych: ( - ) mood change, ( - ) new changes  All other systems were reviewed with the patient and are negative.  PHYSICAL EXAMINATION: ECOG PERFORMANCE STATUS: 0 - Asymptomatic  There were no vitals filed for this visit.   There were no vitals filed for this visit.   GENERAL:well appearing middle aged [place] American female. alert, no distress and comfortable SKIN: skin color, texture, turgor are normal, no rashes or significant  lesions EYES: conjunctiva are pink and non-injected, sclera clear LUNGS: clear to auscultation and percussion with normal breathing effort HEART: regular rate & rhythm and no murmurs and no lower extremity edema Musculoskeletal: no cyanosis of digits and no clubbing  PSYCH: alert & oriented x 3, fluent speech NEURO: no focal motor/sensory deficits  LABORATORY DATA:  I have reviewed the data as listed    Latest Ref Rng & Units 01/27/2023    3:13 PM 07/01/2022    3:59 PM 10/17/2021    1:53 PM  CBC  WBC 3.4 - 10.8 x10E3/uL 6.6  6.2  5.7   Hemoglobin 11.1 - 15.9 g/dL 96.2  95.2  84.1   Hematocrit 34.0 - 46.6 % 35.6  37.9  35.7   Platelets 150 - 450 x10E3/uL 245  213  232        Latest Ref Rng & Units 01/27/2023    3:13 PM 07/01/2022    3:59 PM 10/17/2021    1:53 PM  CMP  Glucose 70 - 99 mg/dL 86  59  59   BUN 8 - 27 mg/dL 7  12  6    Creatinine 0.57 - 1.00 mg/dL 3.24  4.01  0.27   Sodium 134 - 144 mmol/L 144  144  143   Potassium 3.5 - 5.2 mmol/L 4.4  4.3  4.4   Chloride 96 - 106 mmol/L 105  104  105   CO2 20 - 29 mmol/L 24  26  25    Calcium 8.7 - 10.3 mg/dL 9.4  25.3  9.9   Total Protein 6.0 - 8.5 g/dL 7.5  7.5  7.1   Total Bilirubin 0.0 - 1.2 mg/dL 0.3  <6.6  <4.4   Alkaline Phos 44 - 121 IU/L 89  86  67   AST 0 - 40 IU/L 16  18  15    ALT 0 - 32 IU/L 5  9  6     RADIOGRAPHIC STUDIES: No results found.  ASSESSMENT & PLAN [name] 62 y.o. female with medical history significant  for pulmonary MALT lymphoma who presents for a follow up visit.    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  --Recent CT chest showed no evidence of residual/recurrent disease on 07/29/2022  --labs today show *** --RTC in 6 months time   #PET Avidity in the Colon --patient connected with GI, colonoscopy performed that did reveal polyps, but no active malignancy   No orders of the defined types were placed in this encounter.   All questions were answered. The patient knows to call the clinic with any problems, questions or concerns.  A total of more than 30 minutes were spent on this encounter and over half of that time was spent on counseling and coordination of care as outlined above.   Ulysees Barns, MD Department of Hematology/Oncology [facility] Phone: [phone] Pager: [phone] Email: [email or web address]  01/28/2023 9:35 PM

## 2023-01-29 ENCOUNTER — Inpatient Hospital Stay (HOSPITAL_BASED_OUTPATIENT_CLINIC_OR_DEPARTMENT_OTHER): Payer: MEDICAID | Admitting: Hematology and Oncology

## 2023-01-29 ENCOUNTER — Inpatient Hospital Stay: Payer: MEDICAID | Attending: Hematology and Oncology

## 2023-01-29 VITALS — BP 126/89 | HR 51 | Temp 97.6°F | Resp 16 | Wt 146.1 lb

## 2023-01-29 DIAGNOSIS — F17210 Nicotine dependence, cigarettes, uncomplicated: Secondary | ICD-10-CM | POA: Diagnosis not present

## 2023-01-29 DIAGNOSIS — Z801 Family history of malignant neoplasm of trachea, bronchus and lung: Secondary | ICD-10-CM | POA: Insufficient documentation

## 2023-01-29 DIAGNOSIS — Z800 Family history of malignant neoplasm of digestive organs: Secondary | ICD-10-CM | POA: Insufficient documentation

## 2023-01-29 DIAGNOSIS — Z8572 Personal history of non-Hodgkin lymphomas: Secondary | ICD-10-CM | POA: Insufficient documentation

## 2023-01-29 DIAGNOSIS — K635 Polyp of colon: Secondary | ICD-10-CM | POA: Insufficient documentation

## 2023-01-29 DIAGNOSIS — C884 Extranodal marginal zone B-cell lymphoma of mucosa-associated lymphoid tissue [MALT-lymphoma]: Secondary | ICD-10-CM | POA: Diagnosis not present

## 2023-01-29 DIAGNOSIS — Z803 Family history of malignant neoplasm of breast: Secondary | ICD-10-CM | POA: Diagnosis not present

## 2023-01-29 LAB — CMP (CANCER CENTER ONLY)
ALT: 5 U/L (ref 0–44)
AST: 13 U/L — ABNORMAL LOW (ref 15–41)
Albumin: 4 g/dL (ref 3.5–5.0)
Alkaline Phosphatase: 69 U/L (ref 38–126)
Anion gap: 6 (ref 5–15)
BUN: 9 mg/dL (ref 8–23)
CO2: 27 mmol/L (ref 22–32)
Calcium: 9.4 mg/dL (ref 8.9–10.3)
Chloride: 107 mmol/L (ref 98–111)
Creatinine: 0.82 mg/dL (ref 0.44–1.00)
GFR, Estimated: >60 mL/min (ref >60)
Glucose, Bld: 83 mg/dL (ref 70–99)
Potassium: 4.1 mmol/L (ref 3.5–5.1)
Sodium: 140 mmol/L (ref 135–145)
Total Bilirubin: 0.3 mg/dL (ref 0.3–1.2)
Total Protein: 7.2 g/dL (ref 6.5–8.1)

## 2023-01-29 LAB — CBC WITH DIFFERENTIAL (CANCER CENTER ONLY)
Abs Immature Granulocytes: 0.01 10*3/uL (ref 0.00–0.07)
Basophils Absolute: 0 10*3/uL (ref 0.0–0.1)
Basophils Relative: 0 %
Eosinophils Absolute: 0.1 10*3/uL (ref 0.0–0.5)
Eosinophils Relative: 1 %
HCT: 33.1 % — ABNORMAL LOW (ref 36.0–46.0)
Hemoglobin: 11.5 g/dL — ABNORMAL LOW (ref 12.0–15.0)
Immature Granulocytes: 0 %
Lymphocytes Relative: 31 %
Lymphs Abs: 1.8 10*3/uL (ref 0.7–4.0)
MCH: 31.3 pg (ref 26.0–34.0)
MCHC: 34.7 g/dL (ref 30.0–36.0)
MCV: 89.9 fL (ref 80.0–100.0)
Monocytes Absolute: 0.3 10*3/uL (ref 0.1–1.0)
Monocytes Relative: 6 %
Neutro Abs: 3.6 10*3/uL (ref 1.7–7.7)
Neutrophils Relative %: 62 %
Platelet Count: 223 10*3/uL (ref 150–400)
RBC: 3.68 MIL/uL — ABNORMAL LOW (ref 3.87–5.11)
RDW: 13.8 % (ref 11.5–15.5)
WBC Count: 5.8 10*3/uL (ref 4.0–10.5)
nRBC: 0 % (ref 0.0–0.2)

## 2023-01-29 LAB — LACTATE DEHYDROGENASE: LDH: 162 U/L (ref 98–192)

## 2023-01-29 NOTE — Telephone Encounter (Signed)
Requested Prescriptions  Pending Prescriptions Disp Refills   meloxicam (MOBIC) 7.5 MG tablet [Pharmacy Med Name: MELOXICAM 7.5 MG TABLET] 30 tablet 0    Sig: TAKE 1 TABLET BY MOUTH DAILY. LEFT HIP PAIN. TAKE WITH FOOD. DO NOT TAKE WITH ALEVE, MOTRIN, BC, GOODY POWDER, NAPROXEN OR IBUPROFEN.     Analgesics:  COX2 Inhibitors Failed - 01/28/2023  1:29 AM      Failed - Manual Review: Labs are only required if the patient has taken medication for more than 8 weeks.      Passed - HGB in normal range and within 360 days    Hemoglobin  Date Value Ref Range Status  01/29/2023 11.5 (L) 12.0 - 15.0 g/dL Final  13/12/6576 46.9 11.1 - 15.9 g/dL Final         Passed - Cr in normal range and within 360 days    Creatinine  Date Value Ref Range Status  01/29/2023 0.82 0.44 - 1.00 mg/dL Final   Creatinine, Urine  Date Value Ref Range Status  11/05/2009 54.4 NO NORMAL RANGE ESTABLISHED FOR THIS TEST mg/dL Final         Passed - HCT in normal range and within 360 days    HCT  Date Value Ref Range Status  01/29/2023 33.1 (L) 36.0 - 46.0 % Final   Hematocrit  Date Value Ref Range Status  01/27/2023 35.6 34.0 - 46.6 % Final         Passed - AST in normal range and within 360 days    AST  Date Value Ref Range Status  01/29/2023 13 (L) 15 - 41 U/L Final         Passed - ALT in normal range and within 360 days    ALT  Date Value Ref Range Status  01/29/2023 5 0 - 44 U/L Final         Passed - eGFR is 30 or above and within 360 days    GFR, Est AFR Am  Date Value Ref Range Status  12/06/2019 >60 >60 mL/min Final   GFR, Estimated  Date Value Ref Range Status  01/29/2023 >60 >60 mL/min Final    Comment:    (NOTE) Calculated using the CKD-EPI Creatinine Equation (2021)    eGFR  Date Value Ref Range Status  01/27/2023 96 >59 mL/min/1.73 Final         Passed - Patient is not pregnant      Passed - Valid encounter within last 12 months    Recent Outpatient Visits           2  days ago Primary hypertension   [place] [facility] [place], Shea Stakes, NP   1 month ago Primary hypertension   [place] [facility] [place], Shea Stakes, NP   7 months ago Primary hypertension   [place] [facility] Claiborne Rigg, NP   1 year ago Essential hypertension   [place] [facility] [place], Shea Stakes, NP   1 year ago Encounter for Papanicolaou smear for cervical cancer screening   [facility] [place], Shea Stakes, NP

## 2023-01-30 ENCOUNTER — Other Ambulatory Visit: Payer: Self-pay | Admitting: Nurse Practitioner

## 2023-01-30 DIAGNOSIS — E7800 Pure hypercholesterolemia, unspecified: Secondary | ICD-10-CM

## 2023-01-30 DIAGNOSIS — E559 Vitamin D deficiency, unspecified: Secondary | ICD-10-CM

## 2023-01-30 MED ORDER — VITAMIN D (ERGOCALCIFEROL) 1.25 MG (50000 UNIT) PO CAPS
50000.0000 [IU] | ORAL_CAPSULE | ORAL | 1 refills | Status: DC
Start: 2023-01-30 — End: 2023-06-27

## 2023-01-30 MED ORDER — ATORVASTATIN CALCIUM 20 MG PO TABS: 20.0000 mg | ORAL_TABLET | Freq: Every day | ORAL | 3 refills | Status: DC

## 2023-02-04 ENCOUNTER — Other Ambulatory Visit: Payer: Self-pay | Admitting: Nurse Practitioner

## 2023-02-04 DIAGNOSIS — Z1231 Encounter for screening mammogram for malignant neoplasm of breast: Secondary | ICD-10-CM

## 2023-02-05 ENCOUNTER — Emergency Department (HOSPITAL_COMMUNITY)
Admission: EM | Admit: 2023-02-05 | Discharge: 2023-02-05 | Discharge status: Against Medical Advice | Payer: MEDICAID | Attending: Emergency Medicine | Admitting: Emergency Medicine

## 2023-02-05 ENCOUNTER — Emergency Department (HOSPITAL_COMMUNITY): Payer: MEDICAID

## 2023-02-05 ENCOUNTER — Encounter (HOSPITAL_COMMUNITY): Payer: Self-pay | Admitting: Emergency Medicine

## 2023-02-05 DIAGNOSIS — Z79899 Other long term (current) drug therapy: Secondary | ICD-10-CM | POA: Diagnosis not present

## 2023-02-05 DIAGNOSIS — M542 Cervicalgia: Secondary | ICD-10-CM | POA: Diagnosis present

## 2023-02-05 DIAGNOSIS — I10 Essential (primary) hypertension: Secondary | ICD-10-CM | POA: Insufficient documentation

## 2023-02-05 DIAGNOSIS — M436 Torticollis: Secondary | ICD-10-CM | POA: Insufficient documentation

## 2023-02-05 MED ORDER — DIAZEPAM 5 MG PO TABS
5.0000 mg | ORAL_TABLET | Freq: Once | ORAL | Status: AC
  Administered 2023-02-05: 5 mg via ORAL
  Filled 2023-02-05: qty 1

## 2023-02-05 NOTE — ED Notes (Signed)
Pt inquiring about d/c paperwork. I informed her the provider has not placed her for d/c yet. Informed her provider would be to see her as soon as she could. Pt voicing concerns about being ready to go.

## 2023-02-05 NOTE — ED Provider Notes (Signed)
[place] EMERGENCY DEPARTMENT AT [facility] Provider Note   CSN: [id number] Arrival date & time: 02/05/23  1717     History  Chief Complaint  Patient presents with   Torticollis    [name] is a 62 y.o. female.  The history is provided by the patient and medical records. No language interpreter was used.     Patient is a 62 year old female who presents with a chief complaint of torticollis. Patient states that she has had ongoing L sided neck pain for the last 2 weeks. She reports a similar episode in the past. She states that the pain is sharp and radiates to the back of her neck, back of her head, and L shoulder. She denies dizziness, numbness/tingling in her arms, and weakness in her arms. She reports that she cannot fully rotate her head to the L. She currently takes amlodipine daily for HTN.  She believe she may have slept wrong, causing her neck pain.  Patient mention she has tried he denies, muscle relaxant, anti-inflammatory medication at home without relief.  She does not endorse any fever chills body aches runny nose sneezing coughing chest pain shortness of breath focal numbness or focal weakness  Home Medications Prior to Admission medications   Medication Sig Start Date End Date Taking? Authorizing Provider  albuterol (VENTOLIN HFA) 108 (90 Base) MCG/ACT inhaler Inhale 2 puffs into the lungs every 6 (six) hours as needed for wheezing or shortness of breath. 07/01/22   Claiborne Rigg, NP  amLODipine (NORVASC) 5 MG tablet Take 1 tablet (5 mg total) by mouth daily. 01/27/23   Claiborne Rigg, NP  atorvastatin (LIPITOR) 20 MG tablet Take 1 tablet (20 mg total) by mouth daily. 01/30/23   Claiborne Rigg, NP  Blood Pressure Monitor DEVI Please provide patient with insurance approved blood pressure monitor 04/20/19   Claiborne Rigg, NP  cyclobenzaprine (FLEXERIL) 5 MG tablet Take 1 tablet (5 mg total) by mouth 3 (three) times daily as needed for muscle  spasms. 12/30/22   Claiborne Rigg, NP  meloxicam (MOBIC) 7.5 MG tablet TAKE 1 TABLET BY MOUTH DAILY. LEFT HIP PAIN. TAKE WITH FOOD. DO NOT TAKE WITH ALEVE, MOTRIN, BC, GOODY POWDER, NAPROXEN OR IBUPROFEN. 01/29/23   Claiborne Rigg, NP  Multiple Vitamin (MULTIVITAMIN WITH MINERALS) TABS tablet Take 1 tablet by mouth daily. Women's One-A-Day Mulitvitamin    [provider]  nicotine (NICODERM CQ - DOSED IN MG/24 HOURS) 21 mg/24hr patch Place 1 patch (21 mg total) onto the skin daily. 01/27/23 03/10/23  Claiborne Rigg, NP  traMADol (ULTRAM) 50 MG tablet Take 1 tablet (50 mg total) by mouth every 8 (eight) hours as needed. 01/27/23   Claiborne Rigg, NP  traZODone (DESYREL) 50 MG tablet Take 1-2 tablets (50-100 mg total) by mouth at bedtime. 12/30/22   Claiborne Rigg, NP  umeclidinium-vilanterol (ANORO ELLIPTA) 62.5-25 MCG/ACT AEPB Inhale 1 puff into the lungs daily. 07/01/22   Claiborne Rigg, NP  Vitamin D, Ergocalciferol, (DRISDOL) 1.25 MG (50000 UNIT) CAPS capsule Take 1 capsule (50,000 Units total) by mouth every 7 (seven) days. 01/30/23   Claiborne Rigg, NP      Allergies    Penicillins    Review of Systems   Review of Systems  All other systems reviewed and are negative.   Physical Exam Updated Vital Signs BP 119/73 (BP Location: Left Arm)   Pulse 69   Temp 98.2 F (  36.8 C) (Oral)   Resp 17   LMP 10/25/2012   SpO2 97%  Physical Exam Vitals and nursing note reviewed.  Constitutional:      General: She is not in acute distress.    Appearance: She is well-developed.  HENT:     Head: Atraumatic.  Eyes:     Conjunctiva/sclera: Conjunctivae normal.  Neck:     Comments: No midline spine tenderness.  Able to range her neck. Cardiovascular:     Rate and Rhythm: Normal rate and regular rhythm.  Pulmonary:     Effort: Pulmonary effort is normal.  Musculoskeletal:     Cervical back: Neck supple. Tenderness (Tenderness to left cervical paraspinal muscle on palpation  without any overlying skin changes.  No pulsatile mass.) present.     Comments: 5 out of 5 strength to bilateral upper extremities  Skin:    Findings: No rash.  Neurological:     Mental Status: She is alert.  Psychiatric:        Mood and Affect: Mood normal.     ED Results / Procedures / Treatments   Labs (all labs ordered are listed, but only abnormal results are displayed) Labs Reviewed - No data to display  EKG None  Radiology No results found.  Procedures Procedures    Medications Ordered in ED Medications - No data to display  ED Course/ Medical Decision Making/ A&P                                 Medical Decision Making Amount and/or Complexity of Data Reviewed Radiology: ordered.  Risk Prescription drug management.   BP 119/73 (BP Location: Left Arm)   Pulse 69   Temp 98.2 F (36.8 C) (Oral)   Resp 17   LMP 10/25/2012   SpO2 97%   6:58 PM Patient is a 62 year old female who presents with a chief complaint of torticollis. Patient states that she has had ongoing L sided neck pain for the last 2 weeks. She reports a similar episode in the past. She states that the pain is sharp and radiates to the back of her neck, back of her head, and L shoulder. She denies dizziness, numbness/tingling in her arms, and weakness in her arms. She reports that she cannot fully rotate her head to the L. She currently takes amlodipine daily for HTN.  She believe she may have slept wrong, causing her neck pain.  Patient mention she has tried he denies, muscle relaxant, anti-inflammatory medication at home without relief.  She does not endorse any fever chills body aches runny nose sneezing coughing chest pain shortness of breath focal numbness or focal weakness  On exam, patient is sitting in bed in no acute discomfort.  She does have some reproducible tenderness about her left cervical paraspinal muscle but no overlying skin changes to suggest infection and no carotid bruit no  pulsatile mass.  She does not have any significant midline spine tenderness.  She has equal strength throughout bilateral upper extremities with intact radial pulses.  Sensation is intact throughout.  Vital signs overall reassuring.  Pt however have eloped prior to receiving result of cspine xray which is overall reassuring        Final Clinical Impression(s) / ED Diagnoses Final diagnoses:  Torticollis, acute    Rx / DC Orders ED Discharge Orders     None  Fayrene Helper, PA-C 02/05/23 2359    Elayne Snare K, DO 02/06/23 [phone]

## 2023-02-05 NOTE — ED Triage Notes (Signed)
Pt here from home with c/o left sided neck pain has been ongoing for two weeks , has been trying several meds without relief

## 2023-02-05 NOTE — ED Notes (Signed)
Verified pt had driver with her prior to medicating with Valium. Family at bedside with pt.

## 2023-02-05 NOTE — ED Notes (Signed)
While I was caring for another pt, ED tech informed me pt left prior to d/c.

## 2023-02-06 ENCOUNTER — Encounter (HOSPITAL_COMMUNITY): Payer: Self-pay | Admitting: Emergency Medicine

## 2023-02-09 ENCOUNTER — Other Ambulatory Visit: Payer: Self-pay | Admitting: Nurse Practitioner

## 2023-02-09 DIAGNOSIS — J449 Chronic obstructive pulmonary disease, unspecified: Secondary | ICD-10-CM

## 2023-02-10 NOTE — Telephone Encounter (Signed)
Requested Prescriptions  Pending Prescriptions Disp Refills   VENTOLIN HFA 108 (90 Base) MCG/ACT inhaler [Pharmacy Med Name: VENTOLIN HFA 90 MCG INHALER] 18 each 2    Sig: TAKE 2 PUFFS BY MOUTH EVERY 6 HOURS AS NEEDED FOR WHEEZE OR SHORTNESS OF BREATH     Pulmonology:  Beta Agonists 2 Passed - 02/09/2023  8:29 AM      Passed - Last BP in normal range    BP Readings from Last 1 Encounters:  02/05/23 119/73         Passed - Last Heart Rate in normal range    Pulse Readings from Last 1 Encounters:  02/05/23 69         Passed - Valid encounter within last 12 months    Recent Outpatient Visits           2 weeks ago Primary hypertension   [place] [facility] [place], Shea Stakes, NP   1 month ago Primary hypertension   [place] [facility], Shea Stakes, NP   7 months ago Primary hypertension   [place] [place], Shea Stakes, NP   1 year ago Essential hypertension   [place] [facility] [place], Shea Stakes, NP   2 years ago Encounter for Papanicolaou smear for cervical cancer screening   [facility] [place], Shea Stakes, NP

## 2023-03-10 ENCOUNTER — Other Ambulatory Visit: Payer: Self-pay | Admitting: Nurse Practitioner

## 2023-03-10 DIAGNOSIS — G8929 Other chronic pain: Secondary | ICD-10-CM

## 2023-03-18 ENCOUNTER — Ambulatory Visit
Admission: RE | Admit: 2023-03-18 | Discharge: 2023-03-18 | Disposition: A | Discharge status: Home / Self Care | Payer: MEDICAID | Source: Ambulatory Visit | Attending: Nurse Practitioner

## 2023-03-18 DIAGNOSIS — Z1231 Encounter for screening mammogram for malignant neoplasm of breast: Secondary | ICD-10-CM

## 2023-03-27 ENCOUNTER — Encounter: Payer: Self-pay | Admitting: Gastroenterology

## 2023-05-06 ENCOUNTER — Other Ambulatory Visit: Payer: Self-pay | Admitting: Family Medicine

## 2023-05-06 DIAGNOSIS — G8929 Other chronic pain: Secondary | ICD-10-CM

## 2023-05-08 NOTE — Telephone Encounter (Signed)
Labs in date.  Requested Prescriptions  Pending Prescriptions Disp Refills   meloxicam (MOBIC) 7.5 MG tablet [Pharmacy Med Name: MELOXICAM 7.5 MG TABLET] 30 tablet 0    Sig: TAKE 1 TABLET BY MOUTH DAILY WITH FOOD FOR LEFT HIP PAIN. DO NOT TAKE WITH ALEVE, MOTRIN, BC, GOODY POWDER, NAPROXEN OR IBUPROFEN.     Analgesics:  COX2 Inhibitors Failed - 05/08/2023  8:19 AM      Failed - Manual Review: Labs are only required if the patient has taken medication for more than 8 weeks.      Failed - HGB in normal range and within 360 days    Hemoglobin  Date Value Ref Range Status  01/29/2023 11.5 (L) 12.0 - 15.0 g/dL Final  40/98/1191 47.8 11.1 - 15.9 g/dL Final         Failed - HCT in normal range and within 360 days    HCT  Date Value Ref Range Status  01/29/2023 33.1 (L) 36.0 - 46.0 % Final   Hematocrit  Date Value Ref Range Status  01/27/2023 35.6 34.0 - 46.6 % Final         Failed - AST in normal range and within 360 days    AST  Date Value Ref Range Status  01/29/2023 13 (L) 15 - 41 U/L Final         Passed - Cr in normal range and within 360 days    Creatinine  Date Value Ref Range Status  01/29/2023 0.82 0.44 - 1.00 mg/dL Final   Creatinine, Urine  Date Value Ref Range Status  11/05/2009 54.4 NO NORMAL RANGE ESTABLISHED FOR THIS TEST mg/dL Final         Passed - ALT in normal range and within 360 days    ALT  Date Value Ref Range Status  01/29/2023 5 0 - 44 U/L Final         Passed - eGFR is 30 or above and within 360 days    GFR, Est AFR Am  Date Value Ref Range Status  12/06/2019 >60 >60 mL/min Final   GFR, Estimated  Date Value Ref Range Status  01/29/2023 >60 >60 mL/min Final    Comment:    (NOTE) Calculated using the CKD-EPI Creatinine Equation (2021)    eGFR  Date Value Ref Range Status  01/27/2023 96 >59 mL/min/1.73 Final         Passed - Patient is not pregnant      Passed - Valid encounter within last 12 months    Recent Outpatient Visits            3 months ago Primary hypertension   [facility] [place] Claiborne Rigg, NP   4 months ago Primary hypertension   [facility] [place] Claiborne Rigg, NP   10 months ago Primary hypertension   [facility] [place] Claiborne Rigg, NP   1 year ago Essential hypertension   [facility] [place] Claiborne Rigg, NP   2 years ago Encounter for Papanicolaou smear for cervical cancer screening   [facility] [place] Claiborne Rigg, [place]

## 2023-06-07 ENCOUNTER — Other Ambulatory Visit: Payer: Self-pay | Admitting: Nurse Practitioner

## 2023-06-07 DIAGNOSIS — G8929 Other chronic pain: Secondary | ICD-10-CM

## 2023-06-09 NOTE — Telephone Encounter (Signed)
Requested medication (s) are due for refill today: yes   Requested medication (s) are on the active medication list: yes   Last refill:  05/08/23 #30 0 refills   Future visit scheduled: no   Notes to clinic:  no refills remain. Do you want to refill Rx? Last labs 01/29/23     Requested Prescriptions  Pending Prescriptions Disp Refills   meloxicam (MOBIC) 7.5 MG tablet [Pharmacy Med Name: MELOXICAM 7.5 MG TABLET] 30 tablet 0    Sig: TAKE 1 TABLET BY MOUTH DAILY WITH FOOD FOR LEFT HIP PAIN. DO NOT TAKE WITH ALEVE, MOTRIN, BC, GOODY POWDER, NAPROXEN OR IBUPROFEN.     Analgesics:  COX2 Inhibitors Failed - 06/09/2023 12:14 PM      Failed - Manual Review: Labs are only required if the patient has taken medication for more than 8 weeks.      Failed - HGB in normal range and within 360 days    Hemoglobin  Date Value Ref Range Status  01/29/2023 11.5 (L) 12.0 - 15.0 g/dL Final  09/81/1914 78.2 11.1 - 15.9 g/dL Final         Failed - HCT in normal range and within 360 days    HCT  Date Value Ref Range Status  01/29/2023 33.1 (L) 36.0 - 46.0 % Final   Hematocrit  Date Value Ref Range Status  01/27/2023 35.6 34.0 - 46.6 % Final         Failed - AST in normal range and within 360 days    AST  Date Value Ref Range Status  01/29/2023 13 (L) 15 - 41 U/L Final         Passed - Cr in normal range and within 360 days    Creatinine  Date Value Ref Range Status  01/29/2023 0.82 0.44 - 1.00 mg/dL Final   Creatinine, Urine  Date Value Ref Range Status  11/05/2009 54.4 NO NORMAL RANGE ESTABLISHED FOR THIS TEST mg/dL Final         Passed - ALT in normal range and within 360 days    ALT  Date Value Ref Range Status  01/29/2023 5 0 - 44 U/L Final         Passed - eGFR is 30 or above and within 360 days    GFR, Est AFR Am  Date Value Ref Range Status  12/06/2019 >60 >60 mL/min Final   GFR, Estimated  Date Value Ref Range Status  01/29/2023 >60 >60 mL/min Final    Comment:     (NOTE) Calculated using the CKD-EPI Creatinine Equation (2021)    eGFR  Date Value Ref Range Status  01/27/2023 96 >59 mL/min/1.73 Final         Passed - Patient is not pregnant      Passed - Valid encounter within last 12 months    Recent Outpatient Visits           4 months ago Primary hypertension   [facility] [place] Claiborne Rigg, NP   5 months ago Primary hypertension   [facility] [place] Claiborne Rigg, NP   11 months ago Primary hypertension   [facility] [place] Claiborne Rigg, NP   1 year ago Essential hypertension   [facility] [place] [place] [facility]  [place]. [facility] Claiborne Rigg, NP   2 years ago Encounter for Papanicolaou smear for cervical cancer screening   [facility] [place] Claiborne Rigg, [place]

## 2023-06-16 ENCOUNTER — Other Ambulatory Visit: Payer: Self-pay

## 2023-06-16 DIAGNOSIS — Z87891 Personal history of nicotine dependence: Secondary | ICD-10-CM

## 2023-06-16 DIAGNOSIS — Z122 Encounter for screening for malignant neoplasm of respiratory organs: Secondary | ICD-10-CM

## 2023-06-16 DIAGNOSIS — F17210 Nicotine dependence, cigarettes, uncomplicated: Secondary | ICD-10-CM

## 2023-06-26 ENCOUNTER — Other Ambulatory Visit: Payer: Self-pay | Admitting: Nurse Practitioner

## 2023-06-26 DIAGNOSIS — E559 Vitamin D deficiency, unspecified: Secondary | ICD-10-CM

## 2023-07-19 ENCOUNTER — Other Ambulatory Visit: Payer: Self-pay | Admitting: Family Medicine

## 2023-07-19 DIAGNOSIS — G8929 Other chronic pain: Secondary | ICD-10-CM

## 2023-07-25 ENCOUNTER — Encounter: Payer: Self-pay | Admitting: Acute Care

## 2023-07-28 ENCOUNTER — Ambulatory Visit
Admission: RE | Admit: 2023-07-28 | Discharge: 2023-07-28 | Disposition: A | Discharge status: Home / Self Care | Payer: MEDICAID | Source: Ambulatory Visit | Attending: Acute Care | Admitting: Acute Care

## 2023-07-28 DIAGNOSIS — Z87891 Personal history of nicotine dependence: Secondary | ICD-10-CM

## 2023-07-28 DIAGNOSIS — F17210 Nicotine dependence, cigarettes, uncomplicated: Secondary | ICD-10-CM

## 2023-07-28 DIAGNOSIS — Z122 Encounter for screening for malignant neoplasm of respiratory organs: Secondary | ICD-10-CM

## 2023-08-06 ENCOUNTER — Other Ambulatory Visit: Payer: Self-pay | Admitting: Nurse Practitioner

## 2023-08-06 DIAGNOSIS — I10 Essential (primary) hypertension: Secondary | ICD-10-CM

## 2023-08-26 ENCOUNTER — Other Ambulatory Visit: Payer: Self-pay | Admitting: Hematology and Oncology

## 2023-08-26 DIAGNOSIS — C884 Extranodal marginal zone b-cell lymphoma of mucosa-associated lymphoid tissue (malt-lymphoma) not having achieved remission: Secondary | ICD-10-CM

## 2023-08-26 NOTE — Progress Notes (Unsigned)
 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 PET-CT suggested. 2) 07/19/2019: PET CT performed showed irregular medial right upper lobe nodule shown on recent CT persists and has a maximum SUV of 2.5. Additionally there was noted to be colon activity  3) 08/23/2019: Right Upper Lobe Wedge resection planned. Patient declined this procedure. 4)  09/23/2019: CT guided biopsy of the lesion performed, findings consistent with an extranodal marginal zone lymphoma of mucosal associated tissue (MALT) lymphoma is favored. 5) 10/07/2019: establish care with Dr. Rosaline Coma  6) 11/05/2019: established care with Dr. Lurena Sally in Rad/Onc. Started treatment on 11/18/2019.  7) 12/07/2019: ended radiation treatment 8) 07/31/2022: re-establish care, lost to follow up previously.   Interval History:  [name] 63 y.o. female with medical history significant for pulmonary MALT lymphoma who presents for a follow up visit. The patient's last visit was on 01/29/2023. In the interim since the last visit she was lost to follow up.   On exam today [name] reports that she has been well overall since her last visit.  She has had no new medications, hospitalizations, or ER visits.  Her energy and appetite are strong.  She has had no issues with flu or norovirus this season.  She reports he is not having any chest pain or shortness of breath.  We are currently waiting the results of her CT screening for lung cancer as this will be helpful for our purposes as well.  She reports that she forgot to take her blood pressure medications this morning.  She denies any B symptoms such as chills or sweats.. Overall she feels that she has been healthy and eating  well.  She denies any bleeding, bruising, or dark stools..  Otherwise she denies having issues with fevers, chills, sweats, nausea, or diarrhea.  A full 10 point ROS is listed below.  MEDICAL HISTORY:  Past Medical History:  Diagnosis Date   Anemia 1981   During chilbirth   Bipolar 1 disorder (HCC)    Depression    Emphysema lung (HCC)    Hypertension    Lymphoma (HCC)    Mental disorder    Schizophrenia (HCC)     SURGICAL HISTORY: Past Surgical History:  Procedure Laterality Date   NECK SURGERY     cyst   TUBAL LIGATION      SOCIAL HISTORY: Social History   Socioeconomic History   Marital status: Divorced    Spouse name: Not on file   Number of children: 3   Years of education: Not on file   Highest education level: 9th grade  Occupational History   Occupation: retired  Tobacco Use   Smoking status: Every Day    Current packs/day: 0.75    Average packs/day: 0.8 packs/day for 42.0 years (31.5 ttl pk-yrs)    Types: Cigarettes   Smokeless tobacco: Former    Types: Snuff   Tobacco comments:    pt smoking about 2 cig per day  Vaping Use   Vaping status: Never Used  Substance and Sexual Activity   Alcohol use: Not Currently    Alcohol/week: 4.0 standard drinks of alcohol    Types: 4 Cans of beer per week    Comment: clean for 2 weeks 11/11/19   Drug use:  Yes    Frequency: 2.0 times per week    Types: Marijuana    Comment: last use of weed three days ago   Sexual activity: Yes    Birth control/protection: Surgical  Other Topics Concern   Not on file  Social History Narrative   Not on file   Social Drivers of Health   Financial Resource Strain: Not on file  Food Insecurity: Not on file  Transportation Needs: No Transportation Needs (12/24/2018)   PRAPARE - Administrator, Civil Service (Medical): No    Lack of Transportation (Non-Medical): No  Physical Activity: Not on file  Stress: Not on file  Social Connections: Not on file  Intimate  Partner Violence: Not on file    FAMILY HISTORY: Family History  Problem Relation Age of Onset   Breast cancer Mother    Diabetes Mother    Hypertension Mother    Colon cancer Mother    Lung cancer Father        smoker   Hypertension Daughter    Breast cancer Maternal Aunt    Diabetes Maternal Aunt    Breast cancer Maternal Aunt    Breast cancer Maternal Aunt    Throat cancer Maternal Aunt    Hypertension Maternal Grandmother    Diabetes Maternal Grandmother    Breast cancer Cousin    Diabetes Brother    Diabetes Son     ALLERGIES:  is allergic to penicillins.  MEDICATIONS:  Current Outpatient Medications  Medication Sig Dispense Refill   amLODipine (NORVASC) 5 MG tablet Take 1 tablet (5 mg total) by mouth daily. 90 tablet 1   atorvastatin (LIPITOR) 20 MG tablet Take 1 tablet (20 mg total) by mouth daily. 90 tablet 3   Blood Pressure Monitor DEVI Please provide patient with insurance approved blood pressure monitor 1 each 0   cyclobenzaprine (FLEXERIL) 5 MG tablet Take 1 tablet (5 mg total) by mouth 3 (three) times daily as needed for muscle spasms. 30 tablet 1   meloxicam (MOBIC) 7.5 MG tablet TAKE 1 TABLET BY MOUTH DAILY WITH FOOD FOR LEFT HIP PAIN. DO NOT TAKE WITH ALEVE, MOTRIN, BC, GOODY POWDER, NAPROXEN OR IBUPROFEN 30 tablet 0   Multiple Vitamin (MULTIVITAMIN WITH MINERALS) TABS tablet Take 1 tablet by mouth daily. Women's One-A-Day Mulitvitamin     traMADol (ULTRAM) 50 MG tablet Take 1 tablet (50 mg total) by mouth every 8 (eight) hours as needed. 30 tablet 0   traZODone (DESYREL) 50 MG tablet Take 1-2 tablets (50-100 mg total) by mouth at bedtime. 180 tablet 3   umeclidinium-vilanterol (ANORO ELLIPTA) 62.5-25 MCG/ACT AEPB Inhale 1 puff into the lungs daily. 60 each 6   VENTOLIN HFA 108 (90 Base) MCG/ACT inhaler TAKE 2 PUFFS BY MOUTH EVERY 6 HOURS AS NEEDED FOR WHEEZE OR SHORTNESS OF BREATH 18 each 2   Vitamin D, Ergocalciferol, (DRISDOL) 1.25 MG (50000 UNIT) CAPS  capsule TAKE 1 CAPSULE (50,000 UNITS TOTAL) BY MOUTH EVERY 7 (SEVEN) DAYS 12 capsule 1   No current facility-administered medications for this visit.    REVIEW OF SYSTEMS:   Constitutional: ( - ) fevers, ( - )  chills , ( - ) night sweats Eyes: ( - ) blurriness of vision, ( - ) double vision, ( - ) watery eyes Ears, nose, mouth, throat, and face: ( - ) mucositis, ( - ) sore throat Respiratory: ( - ) cough, ( - ) dyspnea, ( - ) wheezes Cardiovascular: ( - ) palpitation, ( - )  chest discomfort, ( - ) lower extremity swelling Gastrointestinal:  ( - ) nausea, ( - ) heartburn, ( - ) change in bowel habits Skin: ( - ) abnormal skin rashes Lymphatics: ( - ) new lymphadenopathy, ( - ) easy bruising Neurological: ( - ) numbness, ( - ) tingling, ( - ) new weaknesses Behavioral/Psych: ( - ) mood change, ( - ) new changes  All other systems were reviewed with the patient and are negative.  PHYSICAL EXAMINATION: ECOG PERFORMANCE STATUS: 0 - Asymptomatic  Vitals:   08/27/23 0831  BP: (!) 132/95  Pulse: 61  Resp: 13  Temp: (!) 97.1 F (36.2 C)  SpO2: 100%   Filed Weights   08/27/23 0831  Weight: 147 lb 1.6 oz (66.7 kg)    GENERAL:well appearing middle aged [place] American female. alert, no distress and comfortable SKIN: skin color, texture, turgor are normal, no rashes or significant lesions EYES: conjunctiva are pink and non-injected, sclera clear LUNGS: clear to auscultation and percussion with normal breathing effort HEART: regular rate & rhythm and no murmurs and no lower extremity edema Musculoskeletal: no cyanosis of digits and no clubbing  PSYCH: alert & oriented x 3, fluent speech NEURO: no focal motor/sensory deficits  LABORATORY DATA:  I have reviewed the data as listed    Latest Ref Rng & Units 08/27/2023    8:02 AM 01/29/2023    8:18 AM 01/27/2023    3:13 PM  CBC  WBC 4.0 - 10.5 K/uL 5.8  5.8  6.6   Hemoglobin 12.0 - 15.0 g/dL 16.1  09.6  04.5   Hematocrit 36.0 - 46.0  % 36.0  33.1  35.6   Platelets 150 - 400 K/uL 210  223  245        Latest Ref Rng & Units 08/27/2023    8:02 AM 01/29/2023    8:18 AM 01/27/2023    3:13 PM  CMP  Glucose 70 - 99 mg/dL 87  83  86   BUN 8 - 23 mg/dL 10  9  7    Creatinine 0.44 - 1.00 mg/dL 4.09  8.11  9.14   Sodium 135 - 145 mmol/L 142  140  144   Potassium 3.5 - 5.1 mmol/L 4.1  4.1  4.4   Chloride 98 - 111 mmol/L 109  107  105   CO2 22 - 32 mmol/L 28  27  24    Calcium 8.9 - 10.3 mg/dL 9.7  9.4  9.4   Total Protein 6.5 - 8.1 g/dL 7.7  7.2  7.5   Total Bilirubin 0.0 - 1.2 mg/dL 0.3  0.3  0.3   Alkaline Phos 38 - 126 U/L 64  69  89   AST 15 - 41 U/L 16  13  16    ALT 0 - 44 U/L 11  5  5     RADIOGRAPHIC STUDIES: No results found.  ASSESSMENT & PLAN [name] 63 y.o. female with medical history significant for pulmonary MALT lymphoma who presents for a follow up visit.    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  --Recent CT chest showed no evidence of residual/recurrent disease on 07/29/2022.  Repeat CT scan due in March 2026.  Most recent scan from 2025 not yet read.  Requested expedited read. --labs today show WBC 5.8, hemoglobin 12.0, MCV 91.4, platelets 210.  Creatinine and LFTs within normal limits. --No signs or symptoms today concerning for recurrence. --Awaiting  results of his screening CT lung which was performed in March but not yet read.  If there is concerning abnormalities we will see her back early. --RTC in 12 months time (ideally after repeat CT scan in March 2026)   #PET Avidity in the Colon --patient connected with GI, colonoscopy performed that did reveal polyps, but no active malignancy   No orders of the defined types were placed in this encounter.   All questions were answered. The patient knows to call the clinic with any problems, questions or concerns.  A total of more than 25 minutes were spent on this  encounter and over half of that time was spent on counseling and coordination of care as outlined above.   Rogerio Clay, MD Department of Hematology/Oncology [facility] Phone: [phone] Pager: [phone] Email: [email or web address]  08/27/2023 9:10 AM

## 2023-08-27 ENCOUNTER — Inpatient Hospital Stay: Payer: MEDICAID | Attending: Hematology and Oncology

## 2023-08-27 ENCOUNTER — Inpatient Hospital Stay (HOSPITAL_BASED_OUTPATIENT_CLINIC_OR_DEPARTMENT_OTHER): Payer: MEDICAID | Admitting: Hematology and Oncology

## 2023-08-27 VITALS — BP 132/95 | HR 61 | Temp 97.1°F | Resp 13 | Wt 147.1 lb

## 2023-08-27 DIAGNOSIS — Z801 Family history of malignant neoplasm of trachea, bronchus and lung: Secondary | ICD-10-CM | POA: Diagnosis not present

## 2023-08-27 DIAGNOSIS — Z923 Personal history of irradiation: Secondary | ICD-10-CM | POA: Insufficient documentation

## 2023-08-27 DIAGNOSIS — Z800 Family history of malignant neoplasm of digestive organs: Secondary | ICD-10-CM | POA: Diagnosis not present

## 2023-08-27 DIAGNOSIS — F17210 Nicotine dependence, cigarettes, uncomplicated: Secondary | ICD-10-CM | POA: Diagnosis not present

## 2023-08-27 DIAGNOSIS — C884 Extranodal marginal zone b-cell lymphoma of mucosa-associated lymphoid tissue (malt-lymphoma) not having achieved remission: Secondary | ICD-10-CM | POA: Diagnosis not present

## 2023-08-27 DIAGNOSIS — Z803 Family history of malignant neoplasm of breast: Secondary | ICD-10-CM | POA: Diagnosis not present

## 2023-08-27 DIAGNOSIS — Z8572 Personal history of non-Hodgkin lymphomas: Secondary | ICD-10-CM | POA: Diagnosis present

## 2023-08-27 DIAGNOSIS — K635 Polyp of colon: Secondary | ICD-10-CM | POA: Diagnosis not present

## 2023-08-27 LAB — CMP (CANCER CENTER ONLY)
ALT: 11 U/L (ref 0–44)
AST: 16 U/L (ref 15–41)
Albumin: 4.3 g/dL (ref 3.5–5.0)
Alkaline Phosphatase: 64 U/L (ref 38–126)
Anion gap: 5 (ref 5–15)
BUN: 10 mg/dL (ref 8–23)
CO2: 28 mmol/L (ref 22–32)
Calcium: 9.7 mg/dL (ref 8.9–10.3)
Chloride: 109 mmol/L (ref 98–111)
Creatinine: 0.71 mg/dL (ref 0.44–1.00)
GFR, Estimated: >60 mL/min (ref >60)
Glucose, Bld: 87 mg/dL (ref 70–99)
Potassium: 4.1 mmol/L (ref 3.5–5.1)
Sodium: 142 mmol/L (ref 135–145)
Total Bilirubin: 0.3 mg/dL (ref 0.0–1.2)
Total Protein: 7.7 g/dL (ref 6.5–8.1)

## 2023-08-27 LAB — CBC WITH DIFFERENTIAL (CANCER CENTER ONLY)
Abs Immature Granulocytes: 0.01 10*3/uL (ref 0.00–0.07)
Basophils Absolute: 0 10*3/uL (ref 0.0–0.1)
Basophils Relative: 0 %
Eosinophils Absolute: 0.1 10*3/uL (ref 0.0–0.5)
Eosinophils Relative: 1 %
HCT: 36 % (ref 36.0–46.0)
Hemoglobin: 12 g/dL (ref 12.0–15.0)
Immature Granulocytes: 0 %
Lymphocytes Relative: 25 %
Lymphs Abs: 1.5 10*3/uL (ref 0.7–4.0)
MCH: 30.5 pg (ref 26.0–34.0)
MCHC: 33.3 g/dL (ref 30.0–36.0)
MCV: 91.4 fL (ref 80.0–100.0)
Monocytes Absolute: 0.4 10*3/uL (ref 0.1–1.0)
Monocytes Relative: 7 %
Neutro Abs: 3.8 10*3/uL (ref 1.7–7.7)
Neutrophils Relative %: 67 %
Platelet Count: 210 10*3/uL (ref 150–400)
RBC: 3.94 MIL/uL (ref 3.87–5.11)
RDW: 15.5 % (ref 11.5–15.5)
WBC Count: 5.8 10*3/uL (ref 4.0–10.5)
nRBC: 0 % (ref 0.0–0.2)

## 2023-08-27 LAB — LACTATE DEHYDROGENASE: LDH: 157 U/L (ref 98–192)

## 2023-09-04 ENCOUNTER — Other Ambulatory Visit: Payer: Self-pay | Admitting: Nurse Practitioner

## 2023-09-04 ENCOUNTER — Telehealth: Payer: Self-pay | Admitting: Acute Care

## 2023-09-04 DIAGNOSIS — R911 Solitary pulmonary nodule: Secondary | ICD-10-CM

## 2023-09-04 DIAGNOSIS — I10 Essential (primary) hypertension: Secondary | ICD-10-CM

## 2023-09-04 NOTE — Telephone Encounter (Signed)
 Follow up scan ordered for 8 weeks after March 2024 scan, this would be due 09/22/2023.

## 2023-09-04 NOTE — Telephone Encounter (Signed)
 I have called the patient with the results of her low-dose screening CT.  Her scan was read as a lung RADS 0 incomplete.  There is a new right upper lobe area of superimposed probable nodular consolidation. There is a right upper lobe nodule that measures 11.9 mm. When I talked to the patient she stated that she was sick at the time she went for the scan.  She stated she felt like she had an upper respiratory illness but that she did not go to the doctor for it. Currently she has no symptoms.  She states she does not feel sick she has no fever no discolored secretions no hemoptysis.  At this point I do not think there is any reason to treat with antibiotics as she appears to be better. Plan will be for a 4-8 week follow-up low-dose CT to see if there is been resolution to this area versus stability or growth.  The follow-up CT will be done 8 weeks  from the original scan which was done July 28, 2023.  Therefore follow-up scan will be due mid May 2025. Patient is in agreement with this plan and verbalized understanding.  Lung cancer screening team please place order for low-dose CT follow-up due in mid May 2025.  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 Patient was referred to thoracic surgery for wedge resection which she declined. Radiation treatment was done by Dr. Lorelie Rohrer in [place].  Treatment started 11/18/2019 and ended 12/07/2019. Patient has just recently reestablished care 07/31/2022 as she was previously lost to follow-up.  Dr. Rosaline Coma, and NP Jamal Mays I have added you both to this communication so that you are aware that we are following a new change to the right upper lobe.  Short-term follow-up is due to the fact patient feels that she was sick at the time of the scan.  We will keep you in the loop regarding the results of the follow-up scan.  Please do not hesitate to reach out if you have any questions or concerns.  Thank you so much

## 2023-09-16 ENCOUNTER — Encounter: Payer: Self-pay | Admitting: Nurse Practitioner

## 2023-09-16 ENCOUNTER — Ambulatory Visit: Payer: MEDICAID | Attending: Family Medicine | Admitting: Nurse Practitioner

## 2023-09-16 VITALS — BP 97/61 | HR 56 | Resp 19 | Ht 66.0 in | Wt 146.2 lb

## 2023-09-16 DIAGNOSIS — J449 Chronic obstructive pulmonary disease, unspecified: Secondary | ICD-10-CM | POA: Diagnosis not present

## 2023-09-16 DIAGNOSIS — I10 Essential (primary) hypertension: Secondary | ICD-10-CM | POA: Diagnosis not present

## 2023-09-16 DIAGNOSIS — E559 Vitamin D deficiency, unspecified: Secondary | ICD-10-CM | POA: Diagnosis not present

## 2023-09-16 DIAGNOSIS — E7800 Pure hypercholesterolemia, unspecified: Secondary | ICD-10-CM

## 2023-09-16 MED ORDER — VITAMIN D (ERGOCALCIFEROL) 1.25 MG (50000 UNIT) PO CAPS
50000.0000 [IU] | ORAL_CAPSULE | ORAL | 0 refills | Status: AC
Start: 2023-09-16 — End: ?

## 2023-09-16 MED ORDER — AMLODIPINE BESYLATE 5 MG PO TABS: 5.0000 mg | ORAL_TABLET | Freq: Every day | ORAL | 1 refills | Status: DC

## 2023-09-16 MED ORDER — ALBUTEROL SULFATE HFA 108 (90 BASE) MCG/ACT IN AERS: 1.0000 | INHALATION_SPRAY | Freq: Four times a day (QID) | RESPIRATORY_TRACT | 2 refills | Status: DC | PRN

## 2023-09-16 NOTE — Progress Notes (Signed)
 Assessment & Plan:  [name] was seen today for medical management of chronic issues and hypertension.  Diagnoses and all orders for this visit:  Primary hypertension -     amLODipine  (NORVASC ) 5 MG tablet; Take 1 tablet (5 mg total) by mouth daily.  Vitamin D  deficiency disease -     VITAMIN D  25 Hydroxy (Vit-D Deficiency, Fractures) -     Vitamin D , Ergocalciferol , (DRISDOL ) 1.25 MG (50000 UNIT) CAPS capsule; Take 1 capsule (50,000 Units total) by mouth every 7 (seven) days.  Hypercholesterolemia -     Lipid panel  COPD mixed type (HCC) -     albuterol  (VENTOLIN  HFA) 108 (90 Base) MCG/ACT inhaler; Inhale 1-2 puffs into the lungs every 6 (six) hours as needed for wheezing or shortness of breath. -     For home use only DME Nebulizer machine    Patient has been counseled on age-appropriate routine health concerns for screening and prevention. These are reviewed and up-to-date. Referrals have been placed accordingly. Immunizations are up-to-date or declined.    Subjective:   Chief Complaint  Patient presents with   Medical Management of Chronic Issues   Hypertension    [name] 63 y.o. female presents to office today for follow up to HTN  She has a past medical history of Anemia (1981), Bipolar 1 disorder, Depression, Emphysema lung, Hypertension, Lymphoma, Mental disorder, and Schizophrenia.   HTN Blood pressure low normal today. She is currently taking amlodipine  5 mg daily and admits to not drinking water and mostly drinking sodas over the past few weeks.  BP Readings from Last 3 Encounters:  09/16/23 97/61  08/27/23 (!) 132/95  02/05/23 119/73    COPD: Patient complains of cough, wheezing, and increased sputum. Symptoms began several years ago. Symptoms cough productive of thick sputum in small amounts and wheezing does not worsen with exertion. Patient uses 1-2 pillows at night. Patient can walk 30 feet before resting. Patient currently is not on home oxygen  therapy.Aaron Aas Respiratory history: chronic bronchitis and COPD. She has notable wheezing on exam today.   Review of Systems  Constitutional:  Negative for fever, malaise/fatigue and weight loss.  HENT: Negative.  Negative for nosebleeds.   Eyes: Negative.  Negative for blurred vision, double vision and photophobia.  Respiratory:  Positive for cough, sputum production and shortness of breath.   Cardiovascular: Negative.  Negative for chest pain, palpitations and leg swelling.  Gastrointestinal: Negative.  Negative for heartburn, nausea and vomiting.  Musculoskeletal: Negative.  Negative for myalgias.  Neurological: Negative.  Negative for dizziness, focal weakness, seizures and headaches.  Psychiatric/Behavioral: Negative.  Negative for suicidal ideas.     Past Medical History:  Diagnosis Date   Anemia 1981   During chilbirth   Bipolar 1 disorder (HCC)    Depression    Emphysema lung (HCC)    Hypertension    Lymphoma (HCC)    Mental disorder    Schizophrenia (HCC)     Past Surgical History:  Procedure Laterality Date   NECK SURGERY     cyst   TUBAL LIGATION      Family History  Problem Relation Age of Onset   Breast cancer Mother    Diabetes Mother    Hypertension Mother    Colon cancer Mother    Lung cancer Father        smoker   Hypertension Daughter    Breast cancer Maternal Aunt    Diabetes Maternal Aunt    Breast  cancer Maternal Aunt    Breast cancer Maternal Aunt    Throat cancer Maternal Aunt    Hypertension Maternal Grandmother    Diabetes Maternal Grandmother    Breast cancer Cousin    Diabetes Brother    Diabetes Son     Social History Reviewed with no changes to be made today.   Outpatient Medications Prior to Visit  Medication Sig Dispense Refill   Blood Pressure Monitor DEVI Please provide patient with insurance approved blood pressure monitor 1 each 0   cyclobenzaprine  (FLEXERIL ) 5 MG tablet Take 1 tablet (5 mg total) by mouth 3 (three) times daily  as needed for muscle spasms. 30 tablet 1   Multiple Vitamin (MULTIVITAMIN WITH MINERALS) TABS tablet Take 1 tablet by mouth daily. Women's One-A-Day Mulitvitamin     traZODone  (DESYREL ) 50 MG tablet Take 1-2 tablets (50-100 mg total) by mouth at bedtime. 180 tablet 3   umeclidinium-vilanterol (ANORO ELLIPTA ) 62.5-25 MCG/ACT AEPB Inhale 1 puff into the lungs daily. 60 each 6   VENTOLIN  HFA 108 (90 Base) MCG/ACT inhaler TAKE 2 PUFFS BY MOUTH EVERY 6 HOURS AS NEEDED FOR WHEEZE OR SHORTNESS OF BREATH 18 each 2   atorvastatin  (LIPITOR) 20 MG tablet Take 1 tablet (20 mg total) by mouth daily. (Patient not taking: Reported on 09/16/2023) 90 tablet 3   meloxicam  (MOBIC ) 7.5 MG tablet TAKE 1 TABLET BY MOUTH DAILY WITH FOOD FOR LEFT HIP PAIN. DO NOT TAKE WITH ALEVE , MOTRIN , BC, GOODY POWDER, NAPROXEN  OR IBUPROFEN  (Patient not taking: Reported on 09/16/2023) 30 tablet 0   traMADol  (ULTRAM ) 50 MG tablet Take 1 tablet (50 mg total) by mouth every 8 (eight) hours as needed. (Patient not taking: Reported on 09/16/2023) 30 tablet 0   amLODipine  (NORVASC ) 5 MG tablet TAKE 1 TABLET (5 MG TOTAL) BY MOUTH DAILY. 30 tablet 0   Vitamin D , Ergocalciferol , (DRISDOL ) 1.25 MG (50000 UNIT) CAPS capsule TAKE 1 CAPSULE (50,000 UNITS TOTAL) BY MOUTH EVERY 7 (SEVEN) DAYS (Patient not taking: Reported on 09/16/2023) 12 capsule 1   No facility-administered medications prior to visit.    Allergies  Allergen Reactions   Penicillins Hives and Swelling    REACTION: swelling of tongue; hives Did it involve swelling of the face/tongue/throat, SOB, or low BP? Yes Did it involve sudden or severe rash/hives, skin peeling, or any reaction on the inside of your mouth or nose? No Did you need to seek medical attention at a hospital or doctor's office? Yes When did it last happen? ~2014    If all above answers are "NO", may proceed with cephalosporin use.        Objective:    BP 97/61 (BP Location: Left Arm, Patient Position: Sitting, Cuff  Size: Normal)   Pulse (!) 56   Resp 19   Ht 5\' 6"  (1.676 m)   Wt 146 lb 3.2 oz (66.3 kg)   LMP 10/25/2012   SpO2 98%   BMI 23.60 kg/m  Wt Readings from Last 3 Encounters:  09/16/23 146 lb 3.2 oz (66.3 kg)  08/27/23 147 lb 1.6 oz (66.7 kg)  01/29/23 146 lb 1.6 oz (66.3 kg)    Physical Exam Vitals and nursing note reviewed.  Constitutional:      Appearance: She is well-developed.  HENT:     Head: Normocephalic and atraumatic.  Cardiovascular:     Rate and Rhythm: Normal rate and regular rhythm.     Heart sounds: Normal heart sounds. No murmur heard.    No  friction rub. No gallop.  Pulmonary:     Effort: Pulmonary effort is normal. No tachypnea or respiratory distress.     Breath sounds: Wheezing present. No decreased breath sounds, rhonchi or rales.  Chest:     Chest wall: No tenderness.  Abdominal:     General: Bowel sounds are normal.     Palpations: Abdomen is soft.  Musculoskeletal:        General: Normal range of motion.     Cervical back: Normal range of motion.  Skin:    General: Skin is warm and dry.  Neurological:     Mental Status: She is alert and oriented to person, place, and time.     Coordination: Coordination normal.  Psychiatric:        Behavior: Behavior normal. Behavior is cooperative.        Thought Content: Thought content normal.        Judgment: Judgment normal.          Patient has been counseled extensively about nutrition and exercise as well as the importance of adherence with medications and regular follow-up. The patient was given clear instructions to go to ER or return to medical center if symptoms don't improve, worsen or new problems develop. The patient verbalized understanding.   Follow-up: Return in about 3 months (around 12/17/2023).   Collins Dean, FNP-BC [facility] [place], [place] [phone]   09/16/2023, 3:36 PM

## 2023-09-17 LAB — LIPID PANEL
Chol/HDL Ratio: 3.3 ratio (ref 0.0–4.4)
Cholesterol, Total: 205 mg/dL — ABNORMAL HIGH (ref 100–199)
HDL: 62 mg/dL (ref >39)
LDL Chol Calc (NIH): 132 mg/dL — ABNORMAL HIGH (ref 0–99)
Triglycerides: 61 mg/dL (ref 0–149)
VLDL Cholesterol Cal: 11 mg/dL (ref 5–40)

## 2023-09-17 LAB — VITAMIN D 25 HYDROXY (VIT D DEFICIENCY, FRACTURES): Vit D, 25-Hydroxy: 36.9 ng/mL (ref 30.0–100.0)

## 2023-09-25 ENCOUNTER — Ambulatory Visit: Payer: Self-pay | Admitting: Nurse Practitioner

## 2023-10-14 ENCOUNTER — Ambulatory Visit: Payer: MEDICAID

## 2023-10-21 ENCOUNTER — Encounter: Payer: Self-pay | Admitting: Acute Care

## 2023-10-23 ENCOUNTER — Ambulatory Visit: Payer: MEDICAID

## 2023-10-23 ENCOUNTER — Ambulatory Visit
Admission: RE | Admit: 2023-10-23 | Discharge: 2023-10-23 | Disposition: A | Discharge status: Home / Self Care | Payer: MEDICAID | Source: Ambulatory Visit | Attending: Acute Care | Admitting: Acute Care

## 2023-10-23 DIAGNOSIS — R911 Solitary pulmonary nodule: Secondary | ICD-10-CM

## 2023-11-10 ENCOUNTER — Telehealth: Payer: Self-pay | Admitting: Acute Care

## 2023-11-10 NOTE — Telephone Encounter (Signed)
 Called and left VM for pt.     IMPRESSION: Lung-RADS 3, probably benign findings. Short-term follow-up in 6 months is recommended with repeat low-dose chest CT without contrast (please use the following order, CT CHEST LCS NODULE FOLLOW-UP W/O CM). Improved and resolved areas of nodular consolidation, favoring infectious/inflammatory etiology. Residual area of posterior right upper lobe nodularity is technically indeterminate. Underlying pattern of architectural distortion and interstitial thickening is due to remote infection or inflammation.   Aortic Atherosclerosis (ICD10-I70.0) and Emphysema (ICD10-J43.9).     Electronically Signed   By: Rockey Kilts M.D.   On: 11/09/2023 10:25

## 2023-11-12 NOTE — Telephone Encounter (Signed)
 Called and left VM for pt

## 2023-11-21 ENCOUNTER — Other Ambulatory Visit: Payer: Self-pay

## 2023-11-21 DIAGNOSIS — R911 Solitary pulmonary nodule: Secondary | ICD-10-CM

## 2023-11-21 DIAGNOSIS — F17210 Nicotine dependence, cigarettes, uncomplicated: Secondary | ICD-10-CM

## 2023-11-21 DIAGNOSIS — Z122 Encounter for screening for malignant neoplasm of respiratory organs: Secondary | ICD-10-CM

## 2023-11-21 DIAGNOSIS — Z87891 Personal history of nicotine dependence: Secondary | ICD-10-CM

## 2023-11-21 NOTE — Telephone Encounter (Signed)
 Spoke with patient and reviewed results. She will complete a 6 month follow up scan 04/26/2024. Order placed. Result and plan to PCP.

## 2023-11-21 NOTE — Telephone Encounter (Signed)
 Copied from CRM [id number]. Topic: Clinical - Lab/Test Results >> Nov 21, 2023  9:21 AM Joesph PARAS wrote: Reason for CRM: Patient requesting a call back from clinical staff to read her her results. Please reach out to patient to relay. [phone] provided by patient.

## 2024-02-04 ENCOUNTER — Telehealth: Payer: Self-pay | Admitting: Nurse Practitioner

## 2024-02-04 NOTE — Telephone Encounter (Signed)
 Pt unconfirmed appt 9/26 (per vr ) lvm

## 2024-02-06 ENCOUNTER — Ambulatory Visit: Payer: MEDICAID | Admitting: Nurse Practitioner

## 2024-02-17 ENCOUNTER — Other Ambulatory Visit: Payer: Self-pay | Admitting: Nurse Practitioner

## 2024-02-17 DIAGNOSIS — Z1231 Encounter for screening mammogram for malignant neoplasm of breast: Secondary | ICD-10-CM

## 2024-03-01 ENCOUNTER — Ambulatory Visit: Payer: MEDICAID | Admitting: Nurse Practitioner

## 2024-03-01 ENCOUNTER — Ambulatory Visit: Payer: Self-pay

## 2024-03-01 NOTE — Telephone Encounter (Signed)
 Noted

## 2024-03-01 NOTE — Telephone Encounter (Signed)
 FYI Only or Action Required?: FYI only for provider.  Patient was last seen in primary care on 09/16/2023 by Theotis Haze ORN, NP.  Called Nurse Triage reporting Tingling.  Symptoms began several months ago.  Interventions attempted: Nothing.  Symptoms are: unchanged.  Triage Disposition: See PCP Within 2 Weeks  Patient/caregiver understands and will follow disposition?: Yes  Copied from CRM [id number]. Topic: Clinical - Red Word Triage >> Mar 01, 2024 11:13 AM Fonda T wrote: [facility] that prompted transfer to Nurse Triage: Patient calling states she has noticed a place on her right foot that has turned black, has some increased tingling, and it itches.  Patient is concerned as she has diabetes, and would like to be evaluated as soon as possible.  Patient has an appointment today, however unable to make it due to transportation. Reason for Disposition  [1] MILD pain (e.g., does not interfere with normal activities) AND [2] present > 7 days  Answer Assessment - Initial Assessment Questions Pt request to cancel appt today and reschedule 03/08/24.  Advised call back or UC/ED if symptoms worsen.  1. ONSET: When did the pain start?      3 months ago; no pain, pt reports thinks rash 2. LOCATION: Where is the pain located?      Right foot to inner side of ankle rash, smaller than deck of cads, darken rash, dry, hard, no opening, swelling or drainage 3. PAIN: How bad is the pain?    (Scale 1-10; or mild, moderate, severe)     Itching, no pain 5. CAUSE: What do you think is causing the foot pain?     rash 6. OTHER SYMPTOMS: Do you have any other symptoms? (e.g., leg pain, rash, fever, numbness)     Dark rash, not black Denies numbness, weakness, difficulty breathing  Protocols used: Foot Pain-A-AH

## 2024-03-08 ENCOUNTER — Ambulatory Visit: Payer: MEDICAID | Attending: Internal Medicine | Admitting: Internal Medicine

## 2024-03-08 ENCOUNTER — Encounter: Payer: Self-pay | Admitting: Internal Medicine

## 2024-03-08 VITALS — BP 133/80 | HR 55 | Temp 98.1°F | Ht 66.0 in | Wt 141.0 lb

## 2024-03-08 DIAGNOSIS — F17200 Nicotine dependence, unspecified, uncomplicated: Secondary | ICD-10-CM | POA: Diagnosis not present

## 2024-03-08 DIAGNOSIS — B353 Tinea pedis: Secondary | ICD-10-CM

## 2024-03-08 DIAGNOSIS — Z2821 Immunization not carried out because of patient refusal: Secondary | ICD-10-CM

## 2024-03-08 DIAGNOSIS — I10 Essential (primary) hypertension: Secondary | ICD-10-CM

## 2024-03-08 MED ORDER — VARENICLINE TARTRATE (STARTER) 0.5 MG X 11 & 1 MG X 42 PO TBPK: ORAL_TABLET | ORAL | 0 refills | Status: DC

## 2024-03-08 MED ORDER — VARENICLINE TARTRATE 1 MG PO TABS: 1.0000 mg | ORAL_TABLET | Freq: Two times a day (BID) | ORAL | 0 refills | Status: DC

## 2024-03-08 MED ORDER — MICONAZOLE NITRATE 2 % EX CREA: 1.0000 | TOPICAL_CREAM | Freq: Two times a day (BID) | CUTANEOUS | 0 refills | Status: AC

## 2024-03-08 MED ORDER — AMLODIPINE BESYLATE 5 MG PO TABS: 5.0000 mg | ORAL_TABLET | Freq: Every day | ORAL | 0 refills | Status: DC

## 2024-03-08 NOTE — Patient Instructions (Signed)
  VISIT SUMMARY: During your visit, we discussed the skin discoloration and itching on your feet, your desire to quit smoking, and your current management of high blood pressure.  YOUR PLAN: -TINEA PEDIS: Tinea pedis is a fungal infection that affects the skin on your feet, causing itching, flaking, and discoloration. You have been prescribed an antifungal cream to apply twice daily. Please keep your feet clean and dry, and follow up with Dr. Theotis if there is no improvement.  -NICOTINE  DEPENDENCE: Nicotine  dependence is an addiction to tobacco products. You are motivated to quit smoking and have been prescribed Chantix. Start with the starter pack and continue with the continuation pack. Aim to quit smoking after two weeks on Chantix. Be aware of potential side effects and discontinue use and call us  if you experience any.  -ESSENTIAL HYPERTENSION: Essential hypertension is high blood pressure with no identifiable cause. Your condition is currently managed with amlodipine , and a refill has been sent to your pharmacy.  INSTRUCTIONS: Please follow up with Dr. Theotis if there is no improvement in your foot condition. Continue taking your amlodipine  as prescribed. Start Chantix as directed and aim to quit smoking after two weeks. Contact us  if you experience any side effects from Chantix.                      Contains text generated by [employer].                                 Contains text generated by [employer].

## 2024-03-08 NOTE — Progress Notes (Signed)
 Patient ID: Carolie Mcilrath, female    DOB: 03/06/1961  MRN: [id number]  CC: Skin Discoloration (Skin discoloration on bilateral feet, itching, blanching in-between toes x3 mo/Requsting pill form of Chantix  - patches not working /No to flu vax. )   Subjective: Ruqayyah Lute is a 63 y.o. female who presents for UC visit. PCP is Haze Servant. Her concerns today include:  Pt with hx of HTN, HL, COPD, schizoaffective, MALT  Discussed the use of AI scribe software for clinical note transcription with the patient, who gave verbal consent to proceed.  History of Present Illness Maciel Kegg is a 63 year old female who presents with skin discoloration and itching on her feet.  She has experienced discoloration on her feet for the past three months, particularly on the heel and between her toes. This is accompanied by itching and flaking, especially between the toes. She has been using an over-the-counter lotion with moisturizer but has not tried any other treatments.  She is concerned about the discoloration due to a family history of diabetes, as she is aware that diabetes often affects the feet. Pt screen negative for DM in past.  She is currently trying to quit smoking. She started smoking at age 46 and currently smokes less than half a pack a day. She has previously tried nicotine  patches, which were ineffective due to them coming off when she sweats. She is interested in trying Chantix.  She is currently taking amlodipine  and has enough supply for a couple of weeks. Needs RF.    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 18 g 2   atorvastatin  (LIPITOR) 20 MG tablet Take 1 tablet (20 mg total) by mouth daily. 90 tablet 3   Blood Pressure Monitor DEVI Please provide patient with insurance approved blood pressure monitor 1 each 0   cyclobenzaprine  (FLEXERIL ) 5 MG tablet Take 1 tablet (5 mg total) by mouth 3 (three) times daily as needed for muscle spasms. 30 tablet 1   meloxicam  (MOBIC ) 7.5 MG tablet TAKE 1 TABLET BY MOUTH DAILY WITH FOOD FOR LEFT HIP PAIN. DO NOT TAKE WITH ALEVE , MOTRIN , BC, GOODY POWDER, NAPROXEN  OR IBUPROFEN  30 tablet 0   Multiple Vitamin (MULTIVITAMIN WITH MINERALS) TABS tablet Take 1 tablet by mouth daily. Women's One-A-Day Mulitvitamin     traMADol  (ULTRAM ) 50 MG tablet Take 1 tablet (50 mg total) by mouth every 8 (eight) hours as needed. 30 tablet 0   traZODone  (DESYREL ) 50 MG tablet Take 1-2 tablets (50-100 mg total) by mouth at bedtime. 180 tablet 3   umeclidinium-vilanterol (ANORO ELLIPTA ) 62.5-25 MCG/ACT AEPB Inhale 1 puff into the lungs daily. 60 each 6   Vitamin D , Ergocalciferol , (DRISDOL ) 1.25 MG (50000 UNIT) CAPS capsule Take 1 capsule (50,000 Units total) by mouth every 7 (seven) days. 12 capsule 0  No current facility-administered medications on file prior to visit.    Allergies  Allergen Reactions   Penicillins Hives and Swelling    REACTION: swelling of tongue; hives Did it involve swelling of the face/tongue/throat, SOB, or low BP? Yes Did it involve sudden or severe rash/hives, skin peeling, or any reaction on the inside of your mouth or nose? No Did you need to seek medical attention at a hospital or doctor's office? Yes When did it  last happen? ~2014    If all above answers are "NO", may proceed with cephalosporin use.     Social History   Socioeconomic History   Marital status: Divorced    Spouse name: Not on file   Number of children: 3   Years of education: Not on file   Highest education level: 9th grade  Occupational History   Occupation: retired  Tobacco Use   Smoking status: Every Day    Current packs/day: 0.75    Average packs/day: 0.8 packs/day for 42.0 years (31.5 ttl pk-yrs)    Types: Cigarettes   Smokeless tobacco: Former    Types: Snuff   Tobacco comments:    pt smoking about 2 cig per day  Vaping Use   Vaping status: Never Used  Substance and Sexual Activity   Alcohol use: Not Currently    Alcohol/week: 4.0 standard drinks of alcohol    Types: 4 Cans of beer per week    Comment: clean for 2 weeks 11/11/19   Drug use: Yes    Frequency: 2.0 times per week    Types: Marijuana    Comment: last use of weed three days ago   Sexual activity: Yes    Birth control/protection: Surgical  Other Topics Concern   Not on file  Social History Narrative   Not on file   Social Drivers of Health   Financial Resource Strain: Not on file  Food Insecurity: Not on file  Transportation Needs: No Transportation Needs (12/24/2018)   PRAPARE - Transportation    Lack of Transportation (Medical): No    Lack of Transportation (Non-Medical): No  Physical Activity: Not on file  Stress: Not on file  Social Connections: Not on file  Intimate Partner Violence: Not on file    Family History  Problem Relation Age of Onset   Breast cancer Mother    Diabetes Mother    Hypertension Mother    Colon cancer Mother    Lung cancer Father        smoker   Hypertension Daughter    Breast cancer Maternal Aunt    Diabetes Maternal Aunt    Breast cancer Maternal Aunt    Breast cancer Maternal Aunt    Throat cancer Maternal Aunt    Hypertension Maternal Grandmother    Diabetes Maternal Grandmother    Breast  cancer Cousin    Diabetes Brother    Diabetes Son     Past Surgical History:  Procedure Laterality Date   NECK SURGERY     cyst   TUBAL LIGATION      ROS: Review of Systems Negative except as stated above  PHYSICAL EXAM: BP 133/80 (BP Location: Left Arm, Patient Position: Sitting, Cuff Size: Normal)   Pulse (!) 55   Temp 98.1 F (36.7 C) (Oral)   Ht 5' 6 (1.676 m)   Wt 141 lb (64 kg)   LMP 10/25/2012   SpO2 97%   BMI 22.76 kg/m   Physical Exam  General appearance - alert, well  appearing, and in no distress Mental status - normal mood, behavior, speech, dress, motor activity, and thought processes Skin - feet: mild hypopigmentation b/w 4th-5th toes, mild hyperpigmentation and peeling of skin medical heels RT>LT      Latest Ref Rng & Units 08/27/2023    8:02 AM 01/29/2023    8:18 AM 01/27/2023    3:13 PM  CMP  Glucose 70 - 99 mg/dL 87  83  86   BUN 8 - 23 mg/dL 10  9  7    Creatinine 0.44 - 1.00 mg/dL 9.28  9.17  9.28   Sodium 135 - 145 mmol/L 142  140  144   Potassium 3.5 - 5.1 mmol/L 4.1  4.1  4.4   Chloride 98 - 111 mmol/L 109  107  105   CO2 22 - 32 mmol/L 28  27  24    Calcium  8.9 - 10.3 mg/dL 9.7  9.4  9.4   Total Protein 6.5 - 8.1 g/dL 7.7  7.2  7.5   Total Bilirubin 0.0 - 1.2 mg/dL 0.3  0.3  0.3   Alkaline Phos 38 - 126 U/L 64  69  89   AST 15 - 41 U/L 16  13  16    ALT 0 - 44 U/L 11  5  5     Lipid Panel     Component Value Date/Time   CHOL 205 (H) 09/16/2023 1452   TRIG 61 09/16/2023 1452   HDL 62 09/16/2023 1452   CHOLHDL 3.3 09/16/2023 1452   CHOLHDL 2.0 Ratio 07/09/2010 2059   VLDL 15 07/09/2010 2059   LDLCALC 132 (H) 09/16/2023 1452    CBC    Component Value Date/Time   WBC 5.8 08/27/2023 0802   WBC 3.3 (L) 09/23/2019 0946   RBC 3.94 08/27/2023 0802   HGB 12.0 08/27/2023 0802   HGB 11.8 01/27/2023 1513   HCT 36.0 08/27/2023 0802   HCT 35.6 01/27/2023 1513   PLT 210 08/27/2023 0802   PLT 245 01/27/2023 1513   MCV 91.4 08/27/2023 0802    MCV 93 01/27/2023 1513   MCH 30.5 08/27/2023 0802   MCHC 33.3 08/27/2023 0802   RDW 15.5 08/27/2023 0802   RDW 13.4 01/27/2023 1513   LYMPHSABS 1.5 08/27/2023 0802   LYMPHSABS 2.5 01/27/2023 1513   MONOABS 0.4 08/27/2023 0802   EOSABS 0.1 08/27/2023 0802   EOSABS 0.1 01/27/2023 1513   BASOSABS 0.0 08/27/2023 0802   BASOSABS 0.0 01/27/2023 1513    ASSESSMENT AND PLAN:  Assessment and Plan Assessment & Plan Tinea pedis Chronic fungal infection between toes and on heel for three months. - Prescribed antifungal cream twice daily - Miconazole cream. - Advised keeping feet clean and dry. - Instructed to follow up with NP Theotis if no improvement.  Nicotine  dependence Pt is current smoker. Patient is wanting to and advised to quit smoking. Wants to try Chantix.  Advised the med can cause vivid dreams and mood swings. I went over starter pk then continuation pack. Reassess progress on next visit. 2 mins spent on counseling.  Essential hypertension - Sent refill for amlodipine .    Patient was given the opportunity to ask questions.  Patient verbalized understanding of the plan and was able to repeat key elements of the plan.   This documentation was completed using Paediatric nurse.  Any transcriptional errors are unintentional.  No orders of the defined types were placed in this encounter.    Requested Prescriptions   Signed Prescriptions  Disp Refills   Varenicline Tartrate, Starter, (CHANTIX STARTING MONTH PAK) 0.5 MG X 11 & 1 MG X 42 TBPK 53 each 0    Sig: 0.5 mg PO daily day 1-3, then BID day 4-7 then 1 tab BID   varenicline (CHANTIX CONTINUING MONTH PAK) 1 MG tablet 60 tablet 0    Sig: Take 1 tablet (1 mg total) by mouth 2 (two) times daily. Start after you complete the starter back.   miconazole (MICATIN) 2 % cream 14 g 0    Sig: Apply 1 Application topically 2 (two) times daily.   amLODipine  (NORVASC ) 5 MG tablet 90 tablet 0    Sig: Take 1  tablet (5 mg total) by mouth daily.    Return in about 6 weeks (around 04/19/2024) for give her f/u with her PCP for chronic ds management.  Barnie Louder, MD, FACP

## 2024-03-19 ENCOUNTER — Ambulatory Visit
Admission: RE | Admit: 2024-03-19 | Discharge: 2024-03-19 | Disposition: A | Discharge status: Home / Self Care | Payer: MEDICAID | Source: Ambulatory Visit | Attending: Nurse Practitioner | Admitting: Nurse Practitioner

## 2024-03-19 DIAGNOSIS — Z1231 Encounter for screening mammogram for malignant neoplasm of breast: Secondary | ICD-10-CM

## 2024-03-31 ENCOUNTER — Ambulatory Visit: Payer: Self-pay | Admitting: Nurse Practitioner

## 2024-04-19 ENCOUNTER — Ambulatory Visit: Payer: MEDICAID | Admitting: Nurse Practitioner

## 2024-04-26 ENCOUNTER — Inpatient Hospital Stay
Admission: RE | Admit: 2024-04-26 | Discharge: 2024-04-26 | Disposition: A | Payer: MEDICAID | Source: Ambulatory Visit | Attending: Acute Care | Admitting: Acute Care

## 2024-04-26 DIAGNOSIS — R911 Solitary pulmonary nodule: Secondary | ICD-10-CM

## 2024-04-26 DIAGNOSIS — Z87891 Personal history of nicotine dependence: Secondary | ICD-10-CM

## 2024-04-26 DIAGNOSIS — F17210 Nicotine dependence, cigarettes, uncomplicated: Secondary | ICD-10-CM

## 2024-04-26 DIAGNOSIS — Z122 Encounter for screening for malignant neoplasm of respiratory organs: Secondary | ICD-10-CM

## 2024-05-03 ENCOUNTER — Other Ambulatory Visit: Payer: Self-pay

## 2024-05-03 ENCOUNTER — Telehealth: Payer: Self-pay | Admitting: *Deleted

## 2024-05-03 DIAGNOSIS — F17210 Nicotine dependence, cigarettes, uncomplicated: Secondary | ICD-10-CM

## 2024-05-03 DIAGNOSIS — Z122 Encounter for screening for malignant neoplasm of respiratory organs: Secondary | ICD-10-CM

## 2024-05-03 DIAGNOSIS — Z87891 Personal history of nicotine dependence: Secondary | ICD-10-CM

## 2024-05-03 NOTE — Telephone Encounter (Signed)
 Lauraine Lites, NP has reviewed lung screening CT from 04/26/24 and is in agreement to repeat CT in 12 months. Please call or send letter to pt regarding results.

## 2024-05-03 NOTE — Telephone Encounter (Signed)
 Letter sent and order placed.

## 2024-05-17 ENCOUNTER — Other Ambulatory Visit: Payer: Self-pay | Admitting: Internal Medicine

## 2024-05-17 DIAGNOSIS — F17200 Nicotine dependence, unspecified, uncomplicated: Secondary | ICD-10-CM

## 2024-05-18 NOTE — Telephone Encounter (Signed)
 Requested Prescriptions  Pending Prescriptions Disp Refills   varenicline  (CHANTIX ) 1 MG tablet [Pharmacy Med Name: VARENICLINE  1 MG TABLET] 180 tablet 0    Sig: TAKE 1 TABLET (1 MG TOTAL) BY MOUTH 2 (TWO) TIMES DAILY. START AFTER YOU COMPLETE THE STARTER BACK.     Psychiatry:  Drug Dependence Therapy - varenicline  Failed - 05/18/2024  2:08 PM      Failed - Manual Review: Do not refill starter pack. 1 mg tabs may be extended up to one year if the patient has quit smoking but still feels at risk for relapse.      Failed - Cr in normal range and within 180 days    Creatinine  Date Value Ref Range Status  08/27/2023 0.71 0.44 - 1.00 mg/dL Final   Creatinine, Urine  Date Value Ref Range Status  11/05/2009 54.4 NO NORMAL RANGE ESTABLISHED FOR THIS TEST mg/dL Final         Failed - Valid encounter within last 6 months    Recent Outpatient Visits           2 months ago Tobacco dependence   [facility] [place]. [facility] Barnie NOVAK, MD   8 months ago Primary hypertension   [facility] [place]. [facility] Theotis Haze ORN, NP   1 year ago Primary hypertension   [facility] [place]. [facility] Theotis Haze ORN, NP   1 year ago Primary hypertension    [facility] [place]. [facility] Theotis Haze ORN, NP   1 year ago Primary hypertension    [facility] [place]. [facility] [place], [place], NP              Passed - Completed PHQ-2 or PHQ-9 in the last 360 days

## 2024-05-21 ENCOUNTER — Ambulatory Visit: Payer: MEDICAID | Attending: Nurse Practitioner | Admitting: Nurse Practitioner

## 2024-05-21 ENCOUNTER — Encounter: Payer: Self-pay | Admitting: Nurse Practitioner

## 2024-05-21 VITALS — BP 129/82 | HR 61 | Ht 66.0 in | Wt 145.4 lb

## 2024-05-21 DIAGNOSIS — F5101 Primary insomnia: Secondary | ICD-10-CM | POA: Diagnosis not present

## 2024-05-21 DIAGNOSIS — R7989 Other specified abnormal findings of blood chemistry: Secondary | ICD-10-CM

## 2024-05-21 DIAGNOSIS — I10 Essential (primary) hypertension: Secondary | ICD-10-CM | POA: Diagnosis not present

## 2024-05-21 DIAGNOSIS — Z1211 Encounter for screening for malignant neoplasm of colon: Secondary | ICD-10-CM | POA: Diagnosis not present

## 2024-05-21 DIAGNOSIS — G8929 Other chronic pain: Secondary | ICD-10-CM | POA: Diagnosis not present

## 2024-05-21 DIAGNOSIS — M25552 Pain in left hip: Secondary | ICD-10-CM

## 2024-05-21 DIAGNOSIS — E559 Vitamin D deficiency, unspecified: Secondary | ICD-10-CM | POA: Diagnosis not present

## 2024-05-21 DIAGNOSIS — E7800 Pure hypercholesterolemia, unspecified: Secondary | ICD-10-CM | POA: Diagnosis not present

## 2024-05-21 MED ORDER — MELOXICAM 7.5 MG PO TABS: 7.5000 mg | ORAL_TABLET | Freq: Every day | ORAL | 0 refills | Status: AC

## 2024-05-21 MED ORDER — ALBUTEROL SULFATE HFA 108 (90 BASE) MCG/ACT IN AERS: 1.0000 | INHALATION_SPRAY | Freq: Four times a day (QID) | RESPIRATORY_TRACT | 2 refills | Status: AC | PRN

## 2024-05-21 MED ORDER — AMLODIPINE BESYLATE 5 MG PO TABS: 5.0000 mg | ORAL_TABLET | Freq: Every day | ORAL | 1 refills | Status: AC

## 2024-05-21 MED ORDER — TRAZODONE HCL 50 MG PO TABS: 50.0000 mg | ORAL_TABLET | Freq: Every day | ORAL | 3 refills | Status: AC

## 2024-05-21 MED ORDER — ATORVASTATIN CALCIUM 20 MG PO TABS: 20.0000 mg | ORAL_TABLET | Freq: Every day | ORAL | 3 refills | Status: AC

## 2024-05-21 NOTE — Progress Notes (Signed)
 "  Assessment & Plan:  [name] was seen today for hypertension.  Diagnoses and all orders for this visit:  Primary hypertension -     albuterol  (VENTOLIN  HFA) 108 (90 Base) MCG/ACT inhaler; Inhale 1-2 puffs into the lungs every 6 (six) hours as needed for wheezing or shortness of breath. -     CMP14+EGFR -     amLODipine  (NORVASC ) 5 MG tablet; Take 1 tablet (5 mg total) by mouth daily. Continue all antihypertensives as prescribed.  Reminded to bring in blood pressure log for follow  up appointment.  RECOMMENDATIONS: DASH/Mediterranean Diets are healthier choices for HTN.    Chronic left hip pain -     meloxicam  (MOBIC ) 7.5 MG tablet; Take 1 tablet (7.5 mg total) by mouth daily. TAKE WITH FOOD  Primary insomnia -     traZODone  (DESYREL ) 50 MG tablet; Take 1-2 tablets (50-100 mg total) by mouth at bedtime.  Colon cancer screening -     Ambulatory referral to Gastroenterology  Hypercholesterolemia -     atorvastatin  (LIPITOR) 20 MG tablet; Take 1 tablet (20 mg total) by mouth daily.  Vitamin D  deficiency disease -     VITAMIN D  25 Hydroxy (Vit-D Deficiency, Fractures)  Abnormal CBC -     CBC with Differential/Platelet    Patient has been counseled on age-appropriate routine health concerns for screening and prevention. These are reviewed and up-to-date. Referrals have been placed accordingly. Immunizations are up-to-date or declined.    Subjective:   Chief Complaint  Patient presents with   Hypertension    [name] 64 y.o. female presents to office today for HTN  She has a past medical history of Anemia (1981), Bipolar 1 disorder, Depression, Emphysema lung, Hypertension, Lymphoma, Mental disorder, and Schizophrenia.    HTN Blood pressure is well controlled with amlodipine  5 mg daily.  BP Readings from Last 3 Encounters:  05/21/24 129/82  03/08/24 133/80  09/16/23 97/61    She has chronic left hip pain which is controlled with meloxicam    She is currently  smoking three cigarettes a day, reduced from a pack a day. She has been using Chantix , which has helped her reduce her smoking. Nicotine  patches were ineffective for her in the past, as they increased her urge to smoke. She uses her inhaler less frequently since reducing her smoking. She has a history of a stable lung nodule on the right side, which has not changed in size. Her last CT scan showed no new pulm nodules. She has been cancer-free since 2021. Previously, she had CT scans every six months, but now they are scheduled annually due to the stability of nodule size.  Her son, who is 38 years old, has diabetes and has undergone a leg amputation. She is concerned about diabetes, but her A1c has always been normal, and she has no history of diabetes.  She is the primary caregiver for her granddaughter, whom she cares for every day. Her son is on disability     Review of Systems  Constitutional:  Negative for fever, malaise/fatigue and weight loss.  HENT: Negative.  Negative for nosebleeds.   Eyes: Negative.  Negative for blurred vision, double vision and photophobia.  Respiratory: Negative.  Negative for cough and shortness of breath.   Cardiovascular: Negative.  Negative for chest pain, palpitations and leg swelling.  Gastrointestinal: Negative.  Negative for heartburn, nausea and vomiting.  Musculoskeletal:  Positive for joint pain. Negative for myalgias.  Neurological: Negative.  Negative for dizziness,  focal weakness, seizures and headaches.  Psychiatric/Behavioral: Negative.  Negative for suicidal ideas.     Past Medical History:  Diagnosis Date   Anemia 1981   During chilbirth   Bipolar 1 disorder (HCC)    Depression    Emphysema lung (HCC)    Hypertension    Lymphoma (HCC)    Mental disorder    Schizophrenia (HCC)     Past Surgical History:  Procedure Laterality Date   NECK SURGERY     cyst   TUBAL LIGATION      Family History  Problem Relation Age of Onset    Breast cancer Mother    Diabetes Mother    Hypertension Mother    Colon cancer Mother    Lung cancer Father        smoker   Hypertension Daughter    Breast cancer Maternal Aunt    Diabetes Maternal Aunt    Breast cancer Maternal Aunt    Breast cancer Maternal Aunt    Throat cancer Maternal Aunt    Hypertension Maternal Grandmother    Diabetes Maternal Grandmother    Breast cancer Cousin    Diabetes Brother    Diabetes Son     Social History Reviewed with no changes to be made today.   Outpatient Medications Prior to Visit  Medication Sig Dispense Refill   Blood Pressure Monitor DEVI Please provide patient with insurance approved blood pressure monitor 1 each 0   cyclobenzaprine  (FLEXERIL ) 5 MG tablet Take 1 tablet (5 mg total) by mouth 3 (three) times daily as needed for muscle spasms. 30 tablet 1   miconazole  (MICATIN) 2 % cream Apply 1 Application topically 2 (two) times daily. 14 g 0   Multiple Vitamin (MULTIVITAMIN WITH MINERALS) TABS tablet Take 1 tablet by mouth daily. Women's One-A-Day Mulitvitamin     traMADol  (ULTRAM ) 50 MG tablet Take 1 tablet (50 mg total) by mouth every 8 (eight) hours as needed. 30 tablet 0   umeclidinium-vilanterol (ANORO ELLIPTA ) 62.5-25 MCG/ACT AEPB Inhale 1 puff into the lungs daily. 60 each 6   varenicline  (CHANTIX ) 1 MG tablet TAKE 1 TABLET (1 MG TOTAL) BY MOUTH 2 (TWO) TIMES DAILY. START AFTER YOU COMPLETE THE STARTER BACK. 180 tablet 0   Vitamin D , Ergocalciferol , (DRISDOL ) 1.25 MG (50000 UNIT) CAPS capsule Take 1 capsule (50,000 Units total) by mouth every 7 (seven) days. 12 capsule 0   albuterol  (VENTOLIN  HFA) 108 (90 Base) MCG/ACT inhaler Inhale 1-2 puffs into the lungs every 6 (six) hours as needed for wheezing or shortness of breath. 18 g 2   amLODipine  (NORVASC ) 5 MG tablet Take 1 tablet (5 mg total) by mouth daily. 90 tablet 0   atorvastatin  (LIPITOR) 20 MG tablet Take 1 tablet (20 mg total) by mouth daily. 90 tablet 3   meloxicam  (MOBIC )  7.5 MG tablet TAKE 1 TABLET BY MOUTH DAILY WITH FOOD FOR LEFT HIP PAIN. DO NOT TAKE WITH ALEVE , MOTRIN , BC, GOODY POWDER, NAPROXEN  OR IBUPROFEN  30 tablet 0   traZODone  (DESYREL ) 50 MG tablet Take 1-2 tablets (50-100 mg total) by mouth at bedtime. 180 tablet 3   Varenicline  Tartrate, Starter, (CHANTIX  STARTING MONTH PAK) 0.5 MG X 11 & 1 MG X 42 TBPK 0.5 mg PO daily day 1-3, then BID day 4-7 then 1 tab BID (Patient not taking: Reported on 05/21/2024) 53 each 0   No facility-administered medications prior to visit.    Allergies[1]     Objective:    BP 129/82 (  BP Location: Left Arm, Patient Position: Sitting, Cuff Size: Normal)   Pulse 61   Ht 5' 6 (1.676 m)   Wt 145 lb 6.4 oz (66 kg)   LMP 10/25/2012   SpO2 100%   BMI 23.47 kg/m  Wt Readings from Last 3 Encounters:  05/21/24 145 lb 6.4 oz (66 kg)  03/08/24 141 lb (64 kg)  09/16/23 146 lb 3.2 oz (66.3 kg)    Physical Exam Vitals and nursing note reviewed.  Constitutional:      Appearance: She is well-developed.  HENT:     Head: Normocephalic and atraumatic.  Cardiovascular:     Rate and Rhythm: Normal rate and regular rhythm.     Heart sounds: Normal heart sounds. No murmur heard.    No friction rub. No gallop.  Pulmonary:     Effort: Pulmonary effort is normal. No tachypnea or respiratory distress.     Breath sounds: Normal breath sounds. No decreased breath sounds, wheezing, rhonchi or rales.  Chest:     Chest wall: No tenderness.  Musculoskeletal:        General: Normal range of motion.     Cervical back: Normal range of motion.  Skin:    General: Skin is warm and dry.  Neurological:     Mental Status: She is alert and oriented to person, place, and time.     Coordination: Coordination normal.  Psychiatric:        Behavior: Behavior normal. Behavior is cooperative.        Thought Content: Thought content normal.        Judgment: Judgment normal.          Patient has been counseled extensively about nutrition  and exercise as well as the importance of adherence with medications and regular follow-up. The patient was given clear instructions to go to ER or return to medical center if symptoms don't improve, worsen or new problems develop. The patient verbalized understanding.   Follow-up: Return in about 4 months (around 09/18/2024).   Haze LELON Servant, FNP-BC [facility] and [facility] [place], [place] [phone]   06/06/2024, 11:36 PM     [1]  Allergies Allergen Reactions   Penicillins Hives and Swelling    REACTION: swelling of tongue; hives Did it involve swelling of the face/tongue/throat, SOB, or low BP? Yes Did it involve sudden or severe rash/hives, skin peeling, or any reaction on the inside of your mouth or nose? No Did you need to seek medical attention at a hospital or doctor's office? Yes When did it last happen? ~2014    If all above answers are NO, may proceed with cephalosporin use.    "

## 2024-05-22 LAB — CBC WITH DIFFERENTIAL/PLATELET
Basophils Absolute: 0 x10E3/uL (ref 0.0–0.2)
Basos: 0 %
EOS (ABSOLUTE): 0.1 x10E3/uL (ref 0.0–0.4)
Eos: 1 %
Hematocrit: 41 % (ref 34.0–46.6)
Hemoglobin: 13 g/dL (ref 11.1–15.9)
Immature Grans (Abs): 0 x10E3/uL (ref 0.0–0.1)
Immature Granulocytes: 0 %
Lymphocytes Absolute: 2.1 x10E3/uL (ref 0.7–3.1)
Lymphs: 36 %
MCH: 30.2 pg (ref 26.6–33.0)
MCHC: 31.7 g/dL (ref 31.5–35.7)
MCV: 95 fL (ref 79–97)
Monocytes Absolute: 0.3 x10E3/uL (ref 0.1–0.9)
Monocytes: 5 %
Neutrophils Absolute: 3.4 x10E3/uL (ref 1.4–7.0)
Neutrophils: 58 %
Platelets: 243 x10E3/uL (ref 150–450)
RBC: 4.3 x10E6/uL (ref 3.77–5.28)
RDW: 13.5 % (ref 11.7–15.4)
WBC: 5.9 x10E3/uL (ref 3.4–10.8)

## 2024-05-22 LAB — CMP14+EGFR
ALT: 17 IU/L (ref 0–32)
AST: 19 IU/L (ref 0–40)
Albumin: 4.5 g/dL (ref 3.9–4.9)
Alkaline Phosphatase: 75 IU/L (ref 49–135)
BUN/Creatinine Ratio: 11 — ABNORMAL LOW (ref 12–28)
BUN: 8 mg/dL (ref 8–27)
Bilirubin Total: 0.2 mg/dL (ref 0.0–1.2)
CO2: 24 mmol/L (ref 20–29)
Calcium: 9.8 mg/dL (ref 8.7–10.3)
Chloride: 105 mmol/L (ref 96–106)
Creatinine, Ser: 0.73 mg/dL (ref 0.57–1.00)
Globulin, Total: 3 g/dL (ref 1.5–4.5)
Glucose: 91 mg/dL (ref 70–99)
Potassium: 4.2 mmol/L (ref 3.5–5.2)
Sodium: 143 mmol/L (ref 134–144)
Total Protein: 7.5 g/dL (ref 6.0–8.5)
eGFR: 92 mL/min/1.73

## 2024-05-22 LAB — VITAMIN D 25 HYDROXY (VIT D DEFICIENCY, FRACTURES): Vit D, 25-Hydroxy: 33.7 ng/mL (ref 30.0–100.0)

## 2024-05-25 ENCOUNTER — Ambulatory Visit: Payer: Self-pay | Admitting: Nurse Practitioner

## 2024-06-06 ENCOUNTER — Encounter: Payer: Self-pay | Admitting: Nurse Practitioner

## 2024-08-26 ENCOUNTER — Other Ambulatory Visit: Payer: MEDICAID

## 2024-08-26 ENCOUNTER — Ambulatory Visit: Payer: MEDICAID | Admitting: Hematology and Oncology
